# Patient Record
Sex: Female | Born: 1940 | Race: Black or African American | Hispanic: No | Smoking: Former smoker
Health system: Southern US, Community
[De-identification: ages and names within clinical notes are randomized; demographics above are authoritative.]

## PROBLEM LIST (undated history)

## (undated) DIAGNOSIS — E039 Hypothyroidism, unspecified: Secondary | ICD-10-CM

## (undated) DIAGNOSIS — R51 Headache: Secondary | ICD-10-CM

## (undated) DIAGNOSIS — E785 Hyperlipidemia, unspecified: Secondary | ICD-10-CM

## (undated) DIAGNOSIS — R519 Headache, unspecified: Secondary | ICD-10-CM

## (undated) DIAGNOSIS — I10 Essential (primary) hypertension: Secondary | ICD-10-CM

## (undated) DIAGNOSIS — G43909 Migraine, unspecified, not intractable, without status migrainosus: Secondary | ICD-10-CM

## (undated) DIAGNOSIS — G629 Polyneuropathy, unspecified: Secondary | ICD-10-CM

## (undated) DIAGNOSIS — M1990 Unspecified osteoarthritis, unspecified site: Secondary | ICD-10-CM

## (undated) DIAGNOSIS — Z9289 Personal history of other medical treatment: Secondary | ICD-10-CM

## (undated) DIAGNOSIS — I639 Cerebral infarction, unspecified: Secondary | ICD-10-CM

## (undated) DIAGNOSIS — F0390 Unspecified dementia without behavioral disturbance: Secondary | ICD-10-CM

## (undated) HISTORY — PX: CATARACT EXTRACTION, BILATERAL: SHX1313

## (undated) HISTORY — PX: LAPAROSCOPIC CHOLECYSTECTOMY: SUR755

## (undated) HISTORY — PX: CARPAL TUNNEL RELEASE: SHX101

---

## 1997-09-08 ENCOUNTER — Other Ambulatory Visit: Admission: RE | Admit: 1997-09-08 | Discharge: 1997-09-08 | Payer: Self-pay | Admitting: Obstetrics & Gynecology

## 1998-02-04 ENCOUNTER — Encounter: Payer: Self-pay | Admitting: Emergency Medicine

## 1998-02-04 ENCOUNTER — Emergency Department (HOSPITAL_COMMUNITY): Admission: EM | Admit: 1998-02-04 | Discharge: 1998-02-04 | Payer: Self-pay | Admitting: Emergency Medicine

## 1998-02-06 ENCOUNTER — Other Ambulatory Visit: Admission: RE | Admit: 1998-02-06 | Discharge: 1998-02-06 | Payer: Self-pay | Admitting: Obstetrics and Gynecology

## 1998-06-22 ENCOUNTER — Encounter: Payer: Self-pay | Admitting: Family Medicine

## 1998-06-22 ENCOUNTER — Ambulatory Visit (HOSPITAL_COMMUNITY): Admission: RE | Admit: 1998-06-22 | Discharge: 1998-06-22 | Payer: Self-pay | Admitting: Family Medicine

## 2002-06-03 ENCOUNTER — Ambulatory Visit: Admission: RE | Admit: 2002-06-03 | Discharge: 2002-06-03 | Payer: Self-pay | Admitting: Obstetrics and Gynecology

## 2004-01-19 ENCOUNTER — Other Ambulatory Visit: Admission: RE | Admit: 2004-01-19 | Discharge: 2004-01-19 | Payer: Self-pay | Admitting: Obstetrics and Gynecology

## 2004-08-06 ENCOUNTER — Encounter: Admission: RE | Admit: 2004-08-06 | Discharge: 2004-08-06 | Payer: Self-pay | Admitting: Obstetrics and Gynecology

## 2005-01-21 ENCOUNTER — Other Ambulatory Visit: Admission: RE | Admit: 2005-01-21 | Discharge: 2005-01-21 | Payer: Self-pay | Admitting: Obstetrics and Gynecology

## 2005-05-10 ENCOUNTER — Ambulatory Visit: Payer: Self-pay | Admitting: Gastroenterology

## 2005-05-27 ENCOUNTER — Encounter (INDEPENDENT_AMBULATORY_CARE_PROVIDER_SITE_OTHER): Payer: Self-pay | Admitting: *Deleted

## 2005-05-27 ENCOUNTER — Ambulatory Visit: Payer: Self-pay | Admitting: Gastroenterology

## 2005-12-20 ENCOUNTER — Ambulatory Visit (HOSPITAL_BASED_OUTPATIENT_CLINIC_OR_DEPARTMENT_OTHER): Admission: RE | Admit: 2005-12-20 | Discharge: 2005-12-20 | Payer: Self-pay | Admitting: Orthopedic Surgery

## 2006-03-11 ENCOUNTER — Other Ambulatory Visit: Admission: RE | Admit: 2006-03-11 | Discharge: 2006-03-11 | Payer: Self-pay | Admitting: Obstetrics and Gynecology

## 2006-11-27 ENCOUNTER — Ambulatory Visit (HOSPITAL_COMMUNITY): Admission: RE | Admit: 2006-11-27 | Discharge: 2006-11-27 | Payer: Self-pay | Admitting: Internal Medicine

## 2008-04-26 ENCOUNTER — Ambulatory Visit (HOSPITAL_BASED_OUTPATIENT_CLINIC_OR_DEPARTMENT_OTHER): Admission: RE | Admit: 2008-04-26 | Discharge: 2008-04-26 | Payer: Self-pay | Admitting: Orthopedic Surgery

## 2009-01-20 ENCOUNTER — Ambulatory Visit (HOSPITAL_COMMUNITY): Admission: RE | Admit: 2009-01-20 | Discharge: 2009-01-20 | Payer: Self-pay | Admitting: Internal Medicine

## 2009-08-16 ENCOUNTER — Telehealth: Payer: Self-pay | Admitting: Gastroenterology

## 2009-12-20 ENCOUNTER — Encounter (INDEPENDENT_AMBULATORY_CARE_PROVIDER_SITE_OTHER): Payer: Self-pay | Admitting: *Deleted

## 2010-02-23 ENCOUNTER — Encounter (INDEPENDENT_AMBULATORY_CARE_PROVIDER_SITE_OTHER): Payer: Self-pay | Admitting: *Deleted

## 2010-02-28 ENCOUNTER — Encounter: Admission: RE | Admit: 2010-02-28 | Discharge: 2010-02-28 | Payer: Self-pay | Admitting: Internal Medicine

## 2010-03-02 ENCOUNTER — Ambulatory Visit: Payer: Self-pay | Admitting: Gastroenterology

## 2010-03-07 ENCOUNTER — Ambulatory Visit: Payer: Self-pay | Admitting: Gastroenterology

## 2010-05-15 NOTE — Letter (Signed)
Summary: Pre Visit Letter Revised  [facility]  [address] [place], [place] [address]   Phone: [phone]  Fax: [phone]        12/20/2009 MRN: [id number]   Kristen Parks [address] [place], [place]  [address]              Welcome to the Gastroenterology Division at [facility].    You are scheduled to see a nurse for your pre-procedure visit on January 29, 2010 at 10:00am on the 3rd floor at [facility], [address]  We ask that you try to arrive at our office 15 minutes prior to your appointment time to allow for check-in.  Please take a minute to review the attached form.  If you answer "Yes" to one or more of the questions on the first page, we ask that you call the person listed at your earliest opportunity.  If you answer "No" to all of the questions, please complete the rest of the form and bring it to your appointment.    Your nurse visit will consist of discussing your medical and surgical history, your immediate family medical history, and your medications.   If you are unable to list all of your medications on the form, please bring the medication bottles to your appointment and we will list them.  We will need to be aware of both prescribed and over the counter drugs.  We will need to know exact dosage information as well.    Please be prepared to read and sign documents such as consent forms, a financial agreement, and acknowledgement forms.  If necessary, and with your consent, a friend or relative is welcome to sit-in on the nurse visit with you.  Please bring your insurance card so that we may make a copy of it.  If your insurance requires a referral to see a specialist, please bring your referral form from your primary care physician.  No co-pay is required for this nurse visit.     If you cannot keep your appointment, please call [phone] to cancel or reschedule prior to your appointment date.  This allows [place] the  opportunity to schedule an appointment for another patient in need of care.    Thank you for choosing [facility] for your medical needs.  We appreciate the opportunity to care for you.  Please visit [place] at our website  to learn more about our practice.  Sincerely, The Gastroenterology Division

## 2010-05-15 NOTE — Procedures (Signed)
Summary: Colonoscopy  Patient: Kristen Parks Note: All result statuses are Final unless otherwise noted.  Tests: (1) Colonoscopy (COL)   COL Colonoscopy           DONE     [facility]     [address]     [place], [place]  [address]           COLONOSCOPY PROCEDURE REPORT           PATIENT:  Kristen, Parks  MR#:  [id number]     BIRTHDATE:  [birth year], 69 yrs. old  GENDER:  female     ENDOSCOPIST:  Vania Rea. Jarold Motto, MD, [facility]     REF. BY:     PROCEDURE DATE:  03/07/2010     PROCEDURE:  Higher-risk screening colonoscopy G0105           ASA CLASS:  Class II     INDICATIONS:  history of pre-cancerous (adenomatous) colon polyps           MEDICATIONS:   Fentanyl 75 mcg IV, Versed 7 mg IV           DESCRIPTION OF PROCEDURE:   After the risks benefits and     alternatives of the procedure were thoroughly explained, informed     consent was obtained.  Digital rectal exam was performed and     revealed no abnormalities.   The LB CF-[id number] endoscope     was introduced through the anus and advanced to the cecum, which     was identified by both the appendix and ileocecal valve, limited     by a redundant colon.    The quality of the prep was adequate,     using MoviPrep.  The instrument was then slowly withdrawn as the     colon was fully examined.     <<PROCEDUREIMAGES>>           FINDINGS:  Mild diverticulosis was found in the sigmoid to     descending colon segments.  No polyps or cancers were seen.  This     was otherwise a normal examination of the colon.   Retroflexed     views in the rectum revealed no abnormalities.    The scope was     then withdrawn from the patient and the procedure completed.           COMPLICATIONS:  None     ENDOSCOPIC IMPRESSION:     1) Mild diverticulosis in the sigmoid to descending colon     segments     2) No polyps or cancers     3) Otherwise normal examination     RECOMMENDATIONS:     1) Repeat Colonoscopy in 5 years.     2)  high fiber diet     REPEAT EXAM:  No           ______________________________     Vania Rea. Jarold Motto, MD, [facility]           CC:  Jarome Matin, MD           n.     Rosalie Doctor:   Vania Rea. Patterson at 03/07/2010 11:53 AM           [place] Haw, [phone]  Note: An exclamation mark (!) indicates a result that was not dispersed into the flowsheet. Document Creation Date: 03/07/2010 11:54 AM _______________________________________________________________________  (1) Order result status: Final Collection or observation date-time: 03/07/2010 11:47 Requested date-time:  Receipt  date-time:  Reported date-time:  Referring Physician:   Ordering Physician: Sheryn Bison [phone]) Specimen Source:  Source: Launa Grill Order Number: [id number] Lab site:   Appended Document: Colonoscopy    Clinical Lists Changes  Observations: Added new observation of COLONNXTDUE: 02/2015 (03/07/2010 12:09)

## 2010-05-15 NOTE — Miscellaneous (Signed)
Summary: previsit prep/rm  Clinical Lists Changes  Medications: Added new medication of MOVIPREP 100 GM  SOLR (PEG-KCL-NACL-NASULF-NA ASC-C) As per prep instructions. - Signed Rx of MOVIPREP 100 GM  SOLR (PEG-KCL-NACL-NASULF-NA ASC-C) As per prep instructions.;  #1 x 0;  Signed;  Entered by: Sherren Kerns RN;  Authorized by: Mardella Layman MD [facility];  Method used: Electronically to [facility] [phone]*, [address], [place], [place]  [address], Ph: [phone], Fax: [phone] Allergies: Added new allergy or adverse reaction of PENICILLIN Observations: Added new observation of ALLERGY REV: Done (03/02/2010 13:15) Added new observation of NKA: F (03/02/2010 13:15)    Prescriptions: MOVIPREP 100 GM  SOLR (PEG-KCL-NACL-NASULF-NA ASC-C) As per prep instructions.  #1 x 0   Entered by:   Sherren Kerns RN   Authorized by:   Mardella Layman MD [facility]   Signed by:   Sherren Kerns RN on 03/02/2010   Method used:   Electronically to        [facility] [phone]* (retail)       [address]       [place], [place]  [address]       Ph: [phone]       Fax: [phone]   RxID:   [id number]

## 2010-05-15 NOTE — Letter (Signed)
Summary: [facility] Instructions  [facility]  [address] [place], [place] [address]   Phone: [phone]  Fax: [phone]       Kristen Parks    January 11, 1941    MRN: [id number]        Procedure Day Dorna Bloom: Wednesday 03-07-10     Arrival Time: 10:00 am     Procedure Time: 11:00 am     Location of Procedure:                       [facility] (4th Floor)   PREPARATION FOR COLONOSCOPY WITH MOVIPREP   Starting 5 days prior to your procedure  03-02-10 do not eat nuts, seeds, popcorn, corn, beans, peas,  salads, or any raw vegetables.  Do not take any fiber supplements (e.g. Metamucil, Citrucel, and Benefiber).  THE DAY BEFORE YOUR PROCEDURE         DATE:  03-06-10  DAY: Tuesday   1.  Drink clear liquids the entire day-NO SOLID FOOD  2.  Do not drink anything colored red or purple.  Avoid juices with pulp.  No orange juice.  3.  Drink at least 64 oz. (8 glasses) of fluid/clear liquids during the day to prevent dehydration and help the prep work efficiently.  CLEAR LIQUIDS INCLUDE: Water Jello Ice Popsicles Tea (sugar ok, no milk/cream) Powdered fruit flavored drinks Coffee (sugar ok, no milk/cream) Gatorade Juice: apple, white grape, white cranberry  Lemonade Clear bullion, consomm, broth Carbonated beverages (any kind) Strained chicken noodle soup Hard Candy                             4.  In the morning, mix first dose of MoviPrep solution:    Empty 1 Pouch A and 1 Pouch B into the disposable container    Add lukewarm drinking water to the top line of the container. Mix to dissolve    Refrigerate (mixed solution should be used within 24 hrs)  5.  Begin drinking the prep at 5:00 p.m. The MoviPrep container is divided by 4 marks.   Every 15 minutes drink the solution down to the next mark (approximately 8 oz) until the full liter is complete.   6.  Follow completed prep with 16 oz of clear liquid of your choice (Nothing red or purple).   Continue to drink clear liquids until bedtime.  7.  Before going to bed, mix second dose of MoviPrep solution:    Empty 1 Pouch A and 1 Pouch B into the disposable container    Add lukewarm drinking water to the top line of the container. Mix to dissolve    Refrigerate  THE DAY OF YOUR PROCEDURE      DATE:  03-07-10 DAY:  Wednesday  Beginning at  6:00 a.m. (5 hours before procedure):         1. Every 15 minutes, drink the solution down to the next mark (approx 8 oz) until the full liter is complete.  2. Follow completed prep with 16 oz. of clear liquid of your choice.    3. You may drink clear liquids until  9:00 a.m. (2 HOURS BEFORE PROCEDURE).   MEDICATION INSTRUCTIONS  Unless otherwise instructed, you should take regular prescription medications with a small sip of water   as early as possible the morning of your procedure.         OTHER INSTRUCTIONS  You  will need a responsible adult at least 72 years of age to accompany you and drive you home.   This person must remain in the waiting room during your procedure.  Wear loose fitting clothing that is easily removed.  Leave jewelry and other valuables at home.  However, you may wish to bring a book to read or  an iPod/MP3 player to listen to music as you wait for your procedure to start.  Remove all body piercing jewelry and leave at home.  Total time from sign-in until discharge is approximately 2-3 hours.  You should go home directly after your procedure and rest.  You can resume normal activities the  day after your procedure.  The day of your procedure you should not:   Drive   Make legal decisions   Operate machinery   Drink alcohol   Return to work  You will receive specific instructions about eating, activities and medications before you leave.    The above instructions have been reviewed and explained to me by   Sherren Kerns RN  March 02, 2010 2:05 PM    I fully understand and can  verbalize these instructions _____________________________ Date _________

## 2010-05-15 NOTE — Progress Notes (Signed)
Summary: Schedule Colonoscopy  Phone Note Outgoing Call Call back at Home Phone [phone]   Call placed by: Harlow Mares CMA [facility]),  Aug 16, 2009 4:40 PM Call placed to: Patient Summary of Call: patient stated that she never had a colonoscopy then she said that she might have done one but she is not ready to schedule and she will call back when she is ready. I gave the patient the number and advised her the importance of calling back to schedule.  Initial call taken by: Harlow Mares CMA [facility]),  Aug 16, 2009 4:45 PM

## 2010-07-30 LAB — BASIC METABOLIC PANEL
CO2: 26 mEq/L (ref 19–32)
Calcium: 9.3 mg/dL (ref 8.4–10.5)
Creatinine, Ser: 0.66 mg/dL (ref 0.4–1.2)
GFR calc Af Amer: >60 mL/min (ref >60)

## 2010-08-28 NOTE — Op Note (Signed)
Kristen Parks, Kristen Parks                ACCOUNT NO.:  [id number]   MEDICAL RECORD NO.:  [id number]          PATIENT TYPE:  AMB   LOCATION:  DSC                          FACILITY:  [facility]   PHYSICIAN:  Katy Fitch. Sypher, M.D. DATE OF BIRTH:  1941/03/24   DATE OF PROCEDURE:  04/26/2008  DATE OF DISCHARGE:                               OPERATIVE REPORT   PREOPERATIVE DIAGNOSES:  1. Chronic entrapped neuropathy, left median nerve at carpal tunnel.  2. 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  3. 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  4. 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   POSTOPERATIVE DIAGNOSES:  1. Chronic entrapped neuropathy, left median nerve at carpal tunnel.  2. 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  3. 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  4. 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   OPERATION:  1. Release of left transverse carpal ligament.  2. Release of left thumb A1 pulley.  3. Release of left index finger A1 pulley with tenosynovectomy.  4. Release of left long finger A1 pulley with tenosynovectomy.   OPERATING SURGEON:  Katy Fitch. Sypher, MD   ASSISTANT:  Marveen Reeks Dasnoit, PA-C   ANESTHESIA:  General by LMA.   SUPERVISING ANESTHESIOLOGIST:  Burna Forts, MD   INDICATIONS:  Kristen Parks is a 70 year old woman referred for  evaluation and management of significant pain in her left hand.  Clinical examination revealed evidence of entrapped neuropathy of the  median nerve at the level of the carpal tunnel and multiple trigger  fingers.   Clinical examination in the office suggested the involvement of the  thumb and long finger.  [facility] revealed triggering of the index finger as well.   Due to a failed response to the nonoperative measures, she was brought  to the  operating room at this time for release of her left transcarpal  ligament and release of A1 pulleys of the thumb, index, and long  fingers.   Preoperatively, questions were invited and answered in detail.  She was  interviewed by Dr. Jacklynn Bue of Anesthesia and decided to proceed with  general anesthesia by LMA technique.   PROCEDURE:  [name] was brought to the operating room and placed in  supine position on the operating table.  A preoperative time-out was  completed and her allergy to penicillin noted.  Under Dr. Marlane Mingle  supervision, general anesthesia by LMA technique was induced followed by  routine Betadine scrub and paint of left upper extremity.   Following exsanguination of left arm with Esmarch bandage, arterial  tourniquet was inflated to 230 mmHg.  Procedure commenced with a short  incision in the line of the ring finger and the palm.  Subcutaneous  tissues were carefully divided revealing the palmar fascia.  This was  split longitudinally to reveal a common sensory branch of the median  nerve.  These were followed back to transcarpal ligaments, which was  gently isolated from median  nerve.  The ligament was then released along  its ulnar border extending into the distal forearm.   This widely opened carpal canal.  No masses were encountered.  Bleeding  points were not problematic, therefore the wound was then repaired with  intradermal through Prolene.   Attention then directed to the thumb and fingers.  The hand was placed  in a lead hand serial incisions were made of the A1 pulleys of the thumb  index and long fingers.  Subcutaneous were carefully divided taking care  to gently retract neurovascular structures.  The A1 pulleys were  isolated split with scalpel and scissors and the tendons delivered.  The  flexor pollicis longus simply had a constriction at the site of its  compression.  The index and long finger flexor tendons had abundant  synovium that had  built up around the superficialis tendon at the  decussation.  This was removed by delivery of the tendons with a Ragnell  retractor and use of a micro rongeur to strip the tendons.   Thereafter, free range of motion of the thumb index and long finger was  recovered with flexion of the fingertip to the palm.   There were no apparent complications.   Each wound was repaired with intradermal through Prolene and for the  trigger finger wounds, mattress sutures of 5-0 nylon.  There were no  apparent complications.   Ms. Nuccio was placed in compressive dressing of Xeroflo sterile gauze  and a volar plaster splint maintaining the wrist in 5 degrees of  dorsiflexion.   For aftercare, she has provided a prescription for Percocet 5 mg one  p.o. q.4-6 h. p.r.n. pain, 20 tablets without refill.   She will return to our office for interval followup in 1 week for suture  removal and initiation of the therapy program.      Katy Fitch. Sypher, M.D.  Electronically Signed     RVS/MEDQ  D:  04/26/2008  T:  04/27/2008  Job:  [id number]   cc:   Barry Dienes. Eloise Harman, M.D.

## 2011-01-22 ENCOUNTER — Other Ambulatory Visit: Payer: Self-pay | Admitting: Internal Medicine

## 2011-01-22 DIAGNOSIS — Z1231 Encounter for screening mammogram for malignant neoplasm of breast: Secondary | ICD-10-CM

## 2011-02-25 ENCOUNTER — Other Ambulatory Visit: Payer: Self-pay | Admitting: Gastroenterology

## 2011-03-04 ENCOUNTER — Ambulatory Visit: Payer: Self-pay

## 2011-11-29 ENCOUNTER — Ambulatory Visit: Payer: Self-pay

## 2012-01-10 ENCOUNTER — Ambulatory Visit: Payer: Self-pay

## 2012-01-16 ENCOUNTER — Ambulatory Visit: Payer: Self-pay | Admitting: Obstetrics and Gynecology

## 2012-01-23 ENCOUNTER — Ambulatory Visit
Admission: RE | Admit: 2012-01-23 | Discharge: 2012-01-23 | Disposition: A | Discharge status: Home / Self Care | Payer: PRIVATE HEALTH INSURANCE | Source: Ambulatory Visit | Attending: Internal Medicine | Admitting: Internal Medicine

## 2012-01-23 DIAGNOSIS — Z1231 Encounter for screening mammogram for malignant neoplasm of breast: Secondary | ICD-10-CM

## 2012-01-28 ENCOUNTER — Ambulatory Visit (INDEPENDENT_AMBULATORY_CARE_PROVIDER_SITE_OTHER): Payer: PRIVATE HEALTH INSURANCE | Admitting: Obstetrics and Gynecology

## 2012-01-28 ENCOUNTER — Encounter: Payer: Self-pay | Admitting: Obstetrics and Gynecology

## 2012-01-28 VITALS — BP 112/66 | Ht 61.0 in | Wt 136.0 lb

## 2012-01-28 DIAGNOSIS — Z01419 Encounter for gynecological examination (general) (routine) without abnormal findings: Secondary | ICD-10-CM

## 2012-01-28 DIAGNOSIS — Z124 Encounter for screening for malignant neoplasm of cervix: Secondary | ICD-10-CM

## 2012-01-28 NOTE — Progress Notes (Signed)
   Subjective:    Kristen Parks is a 71 y.o. female G6P5 who presents for annual exam. The patient complains of arthritis. She has a history of pelvic vaccination and prolapse.  The following portions of the patient's history were reviewed and updated as appropriate: allergies, current medications, past family history, past medical history, past social history, past surgical history and problem list.  Review of Systems Pertinent items are noted in HPI. Gastrointestinal:No change in bowel habits, no abdominal pain, no rectal bleeding Genitourinary:negative for dysuria, frequency, hematuria, nocturia and urinary incontinence    Objective:     BP 112/66  Ht 5\' 1"  (1.549 m)  Wt 136 lb (61.689 kg)  BMI 25.70 kg/m2  Weight:  Wt Readings from Last 1 Encounters:  01/28/12 136 lb (61.689 kg)     BMI: Body mass index is 25.70 kg/(m^2). General Appearance: Alert, appropriate appearance for age. No acute distress HEENT: Grossly normal Neck / Thyroid: Supple, no masses, nodes or enlargement Lungs: clear to auscultation bilaterally Back: No CVA tenderness Breast Exam: No masses or nodes.No dimpling, nipple retraction or discharge. Cardiovascular: Regular rate and rhythm. S1, S2, no murmur Gastrointestinal: Soft, non-tender, no masses or organomegaly  ++++++++++++++++++++++++++++++++++++++++++++++++++++++++  Pelvic Exam: External genitalia: normal general appearance Vaginal: normal without tenderness, induration or masses and marked relaxation noted Cervix: normal appearance and this ends half the length of the vagina Adnexa: normal bimanual exam Uterus: small, nontender Rectovaginal: normal rectal, no masses  ++++++++++++++++++++++++++++++++++++++++++++++++++++++++  Lymphatic Exam: Non-palpable nodes in neck, clavicular, axillary, or inguinal regions  Psychiatric: Alert and oriented, appropriate affect.@OBJECTIVEEN @      Assessment:    Menopause pelvic relaxation with uterine  prolapse   Overweight or obese: Yes  Pelvic relaxation: Yes  Menopausal symptoms: Yes. Severe: No.   Plan:    Pap smear.   Follow-up:  for annual exam  The updated Pap smear screening guidelines were discussed with the patient. The patient requested that I obtain a Pap smear: Yes.  Kegel exercises discussed: Yes.  Proper diet and regular exercise were reviewed.  Annual mammograms recommended starting at age 63. Proper breast care was discussed.  Screening colonoscopy is recommended beginning at age 44.  Regular health maintenance was reviewed.  Sleep hygiene was discussed.  Adequate calcium and vitamin D intake was emphasized.  Leonard Schwartz M.D.  Regular Periods: no Mammogram: yes  Monthly Breast Ex.: yes Exercise: yes  Tetanus < 10 years: unsure Seatbelts: yes  NI. Bladder Functn.: yes Abuse at home: no  Daily BM's: yes Stressful Work: no  Healthy Diet: yes Sigmoid-Colonoscopy: 2010  Calcium: no Medical problems this year: none   LAST PAP:02/22/2010  Contraception: Post-Menopause  Mammogram:  01/2012  PCP:  Dr. Eloise Harman  PMH: None  FMH: None  Last Bone Scan: unsure of date. Was with PCP.

## 2012-01-29 LAB — PAP IG W/ RFLX HPV ASCU

## 2012-02-10 ENCOUNTER — Other Ambulatory Visit: Payer: Self-pay | Admitting: Internal Medicine

## 2012-02-10 DIAGNOSIS — Z1231 Encounter for screening mammogram for malignant neoplasm of breast: Secondary | ICD-10-CM

## 2013-01-25 ENCOUNTER — Ambulatory Visit: Payer: PRIVATE HEALTH INSURANCE

## 2013-08-12 ENCOUNTER — Other Ambulatory Visit: Payer: Self-pay | Admitting: Obstetrics and Gynecology

## 2013-08-12 DIAGNOSIS — Z1231 Encounter for screening mammogram for malignant neoplasm of breast: Secondary | ICD-10-CM

## 2013-09-02 ENCOUNTER — Ambulatory Visit: Payer: PRIVATE HEALTH INSURANCE

## 2013-09-22 ENCOUNTER — Ambulatory Visit: Payer: PRIVATE HEALTH INSURANCE

## 2013-12-14 ENCOUNTER — Ambulatory Visit
Admission: RE | Admit: 2013-12-14 | Discharge: 2013-12-14 | Disposition: A | Discharge status: Home / Self Care | Payer: PRIVATE HEALTH INSURANCE | Source: Ambulatory Visit | Attending: Obstetrics and Gynecology | Admitting: Obstetrics and Gynecology

## 2013-12-14 DIAGNOSIS — Z1231 Encounter for screening mammogram for malignant neoplasm of breast: Secondary | ICD-10-CM

## 2014-02-14 ENCOUNTER — Encounter: Payer: Self-pay | Admitting: Obstetrics and Gynecology

## 2014-11-16 ENCOUNTER — Encounter: Payer: Self-pay | Admitting: Gastroenterology

## 2014-11-29 ENCOUNTER — Other Ambulatory Visit: Payer: Self-pay

## 2014-11-29 DIAGNOSIS — Z1231 Encounter for screening mammogram for malignant neoplasm of breast: Secondary | ICD-10-CM

## 2014-12-28 ENCOUNTER — Ambulatory Visit: Payer: Self-pay

## 2015-01-17 ENCOUNTER — Ambulatory Visit: Payer: Self-pay

## 2015-02-17 ENCOUNTER — Ambulatory Visit: Payer: Self-pay

## 2015-02-21 ENCOUNTER — Encounter: Payer: Self-pay | Admitting: Internal Medicine

## 2015-11-30 ENCOUNTER — Other Ambulatory Visit: Payer: Self-pay

## 2015-11-30 ENCOUNTER — Other Ambulatory Visit: Payer: Self-pay | Admitting: Obstetrics and Gynecology

## 2015-11-30 DIAGNOSIS — Z1231 Encounter for screening mammogram for malignant neoplasm of breast: Secondary | ICD-10-CM

## 2015-12-14 ENCOUNTER — Ambulatory Visit: Payer: Self-pay

## 2016-11-22 ENCOUNTER — Ambulatory Visit (INDEPENDENT_AMBULATORY_CARE_PROVIDER_SITE_OTHER): Payer: Medicare Other | Admitting: Podiatry

## 2016-11-22 ENCOUNTER — Ambulatory Visit (INDEPENDENT_AMBULATORY_CARE_PROVIDER_SITE_OTHER): Payer: Medicare Other

## 2016-11-22 ENCOUNTER — Encounter: Payer: Self-pay | Admitting: Podiatry

## 2016-11-22 ENCOUNTER — Other Ambulatory Visit: Payer: Self-pay | Admitting: Podiatry

## 2016-11-22 VITALS — BP 120/75 | HR 78 | Resp 16 | Ht 62.0 in | Wt 150.0 lb

## 2016-11-22 DIAGNOSIS — M779 Enthesopathy, unspecified: Secondary | ICD-10-CM

## 2016-11-22 DIAGNOSIS — L600 Ingrowing nail: Secondary | ICD-10-CM | POA: Diagnosis not present

## 2016-11-22 DIAGNOSIS — M79674 Pain in right toe(s): Secondary | ICD-10-CM

## 2016-11-22 NOTE — Progress Notes (Signed)
   Subjective:    Patient ID: Kristen Parks, female    DOB: 08/25/1940, 76 y.o.   MRN: [id number]  HPI Chief Complaint  Patient presents with  . Toe Pain    Right foot; great toe-bottom of toe; pt stated, "Has had for the past 1-2 months"      Review of Systems  Cardiovascular: Positive for leg swelling.  Gastrointestinal: Positive for constipation.  Endocrine: Positive for heat intolerance.  Genitourinary: Positive for frequency.  Musculoskeletal: Positive for myalgias.  Skin: Positive for rash.  Neurological: Positive for headaches.  All other systems reviewed and are negative.      Objective:   Physical Exam        Assessment & Plan:

## 2016-11-22 NOTE — Progress Notes (Signed)
Subjective:    Patient ID: Kristen Parks, female   DOB: 76 y.o.   MRN: [id number]   HPI patient presents concerned about discoloration of the nailbed right over left hallux and states she has pain in both her feet both in the ankles and the forefoot and around the nailbed right over left    Review of Systems  All other systems reviewed and are negative.       Objective:  Physical Exam  Constitutional: She is oriented to person, place, and time.  Cardiovascular: Intact distal pulses.   Musculoskeletal: Normal range of motion.  Neurological: She is alert and oriented to person, place, and time.  Skin: Skin is warm.  Nursing note and vitals reviewed.  neurovascular status intact muscle strength adequate range of motion within normal limits with patient noted to have mild discomfort in the medial lateral both feet and discomfort around the hallux nailbed proximal portion right over left with discoloration of the hallux nail right over left localized of recent duration     Assessment:    Probability for discoloration of nailbeds secondary to trauma with tendinitis and capsulitis-like symptomatology bilateral     Plan:  H&P and x-ray right foot reviewed. At this point I recommended anti-inflammatories and supportive shoe and applied padding to the hallux bilateral take pressure off the toenails. If toenails were to get loose or other pathology were to occur let us know but at this point it should be self-limiting  X-rays indicated that there is arthritis of a moderate nature with no indications of stress fracture

## 2018-04-14 ENCOUNTER — Emergency Department (HOSPITAL_COMMUNITY): Payer: Medicare Other

## 2018-04-14 ENCOUNTER — Observation Stay (HOSPITAL_COMMUNITY): Payer: Medicare Other

## 2018-04-14 ENCOUNTER — Inpatient Hospital Stay (HOSPITAL_COMMUNITY)
Admission: EM | Admit: 2018-04-14 | Discharge: 2018-04-16 | DRG: 066 | Disposition: A | Discharge status: Home / Self Care | Payer: Medicare Other | Attending: Internal Medicine | Admitting: Internal Medicine

## 2018-04-14 ENCOUNTER — Other Ambulatory Visit: Payer: Self-pay

## 2018-04-14 ENCOUNTER — Encounter (HOSPITAL_COMMUNITY): Payer: Self-pay | Admitting: Emergency Medicine

## 2018-04-14 DIAGNOSIS — E785 Hyperlipidemia, unspecified: Secondary | ICD-10-CM | POA: Diagnosis present

## 2018-04-14 DIAGNOSIS — Z66 Do not resuscitate: Secondary | ICD-10-CM | POA: Diagnosis present

## 2018-04-14 DIAGNOSIS — Z79899 Other long term (current) drug therapy: Secondary | ICD-10-CM

## 2018-04-14 DIAGNOSIS — Z9289 Personal history of other medical treatment: Secondary | ICD-10-CM

## 2018-04-14 DIAGNOSIS — I639 Cerebral infarction, unspecified: Secondary | ICD-10-CM

## 2018-04-14 DIAGNOSIS — I6310 Cerebral infarction due to embolism of unspecified precerebral artery: Secondary | ICD-10-CM

## 2018-04-14 DIAGNOSIS — F0391 Unspecified dementia with behavioral disturbance: Secondary | ICD-10-CM | POA: Diagnosis not present

## 2018-04-14 DIAGNOSIS — D509 Iron deficiency anemia, unspecified: Secondary | ICD-10-CM | POA: Diagnosis present

## 2018-04-14 DIAGNOSIS — R531 Weakness: Secondary | ICD-10-CM

## 2018-04-14 DIAGNOSIS — I10 Essential (primary) hypertension: Secondary | ICD-10-CM | POA: Diagnosis present

## 2018-04-14 DIAGNOSIS — G629 Polyneuropathy, unspecified: Secondary | ICD-10-CM | POA: Diagnosis present

## 2018-04-14 DIAGNOSIS — F0390 Unspecified dementia without behavioral disturbance: Secondary | ICD-10-CM | POA: Diagnosis present

## 2018-04-14 DIAGNOSIS — D649 Anemia, unspecified: Secondary | ICD-10-CM | POA: Diagnosis present

## 2018-04-14 DIAGNOSIS — E039 Hypothyroidism, unspecified: Secondary | ICD-10-CM | POA: Diagnosis present

## 2018-04-14 DIAGNOSIS — Z87891 Personal history of nicotine dependence: Secondary | ICD-10-CM

## 2018-04-14 DIAGNOSIS — I63422 Cerebral infarction due to embolism of left anterior cerebral artery: Secondary | ICD-10-CM | POA: Diagnosis not present

## 2018-04-14 DIAGNOSIS — H9190 Unspecified hearing loss, unspecified ear: Secondary | ICD-10-CM | POA: Diagnosis present

## 2018-04-14 DIAGNOSIS — R29701 NIHSS score 1: Secondary | ICD-10-CM | POA: Diagnosis present

## 2018-04-14 HISTORY — DX: Essential (primary) hypertension: I10

## 2018-04-14 HISTORY — DX: Unspecified dementia, unspecified severity, without behavioral disturbance, psychotic disturbance, mood disturbance, and anxiety: F03.90

## 2018-04-14 HISTORY — DX: Headache: R51

## 2018-04-14 HISTORY — DX: Personal history of other medical treatment: Z92.89

## 2018-04-14 HISTORY — DX: Unspecified osteoarthritis, unspecified site: M19.90

## 2018-04-14 HISTORY — DX: Headache, unspecified: R51.9

## 2018-04-14 HISTORY — DX: Cerebral infarction, unspecified: I63.9

## 2018-04-14 HISTORY — DX: Migraine, unspecified, not intractable, without status migrainosus: G43.909

## 2018-04-14 HISTORY — DX: Polyneuropathy, unspecified: G62.9

## 2018-04-14 HISTORY — DX: Hyperlipidemia, unspecified: E78.5

## 2018-04-14 HISTORY — DX: Hypothyroidism, unspecified: E03.9

## 2018-04-14 LAB — PREPARE RBC (CROSSMATCH)

## 2018-04-14 LAB — ABO/RH: ABO/RH(D): A POS

## 2018-04-14 LAB — I-STAT CHEM 8, ED
BUN: 15 mg/dL (ref 8–23)
Calcium, Ion: 1.27 mmol/L (ref 1.15–1.40)
Chloride: 110 mmol/L (ref 98–111)
Creatinine, Ser: 0.7 mg/dL (ref 0.44–1.00)
Glucose, Bld: 96 mg/dL (ref 70–99)
HCT: 22 % — ABNORMAL LOW (ref 36.0–46.0)
Hemoglobin: 7.5 g/dL — ABNORMAL LOW (ref 12.0–15.0)
POTASSIUM: 3.7 mmol/L (ref 3.5–5.1)
Sodium: 142 mmol/L (ref 135–145)
TCO2: 23 mmol/L (ref 22–32)

## 2018-04-14 LAB — I-STAT TROPONIN, ED: Troponin i, poc: 0.01 ng/mL (ref 0.00–0.08)

## 2018-04-14 LAB — COMPREHENSIVE METABOLIC PANEL
ALT: 11 U/L (ref 0–44)
AST: 18 U/L (ref 15–41)
Albumin: 3.6 g/dL (ref 3.5–5.0)
Alkaline Phosphatase: 90 U/L (ref 38–126)
Anion gap: 9 (ref 5–15)
BUN: 14 mg/dL (ref 8–23)
CHLORIDE: 110 mmol/L (ref 98–111)
CO2: 21 mmol/L — ABNORMAL LOW (ref 22–32)
CREATININE: 0.75 mg/dL (ref 0.44–1.00)
Calcium: 9.1 mg/dL (ref 8.9–10.3)
GFR calc Af Amer: >60 mL/min (ref >60)
GFR calc non Af Amer: >60 mL/min (ref >60)
Glucose, Bld: 99 mg/dL (ref 70–99)
Potassium: 3.7 mmol/L (ref 3.5–5.1)
Sodium: 140 mmol/L (ref 135–145)
Total Bilirubin: 0.4 mg/dL (ref 0.3–1.2)
Total Protein: 6.9 g/dL (ref 6.5–8.1)

## 2018-04-14 LAB — CBC WITH DIFFERENTIAL/PLATELET
Abs Immature Granulocytes: 0.02 10*3/uL (ref 0.00–0.07)
Basophils Absolute: 0.1 10*3/uL (ref 0.0–0.1)
Basophils Relative: 1 %
Eosinophils Absolute: 0.1 10*3/uL (ref 0.0–0.5)
Eosinophils Relative: 3 %
HCT: 22.9 % — ABNORMAL LOW (ref 36.0–46.0)
Hemoglobin: 5.5 g/dL — CL (ref 12.0–15.0)
Immature Granulocytes: 0 %
Lymphocytes Relative: 21 %
Lymphs Abs: 1 10*3/uL (ref 0.7–4.0)
MCH: 15.5 pg — AB (ref 26.0–34.0)
MCHC: 24 g/dL — ABNORMAL LOW (ref 30.0–36.0)
MCV: 64.5 fL — ABNORMAL LOW (ref 80.0–100.0)
Monocytes Absolute: 0.6 10*3/uL (ref 0.1–1.0)
Monocytes Relative: 12 %
Neutro Abs: 2.8 10*3/uL (ref 1.7–7.7)
Neutrophils Relative %: 63 %
Platelets: 433 10*3/uL — ABNORMAL HIGH (ref 150–400)
RBC: 3.55 MIL/uL — ABNORMAL LOW (ref 3.87–5.11)
RDW: 20.5 % — ABNORMAL HIGH (ref 11.5–15.5)
WBC: 4.6 10*3/uL (ref 4.0–10.5)
nRBC: 0 % (ref 0.0–0.2)

## 2018-04-14 LAB — PROTIME-INR
INR: 1.16
Prothrombin Time: 14.7 seconds (ref 11.4–15.2)

## 2018-04-14 LAB — ETHANOL: Alcohol, Ethyl (B): <10 mg/dL (ref <10)

## 2018-04-14 LAB — RETICULOCYTES
Immature Retic Fract: 31.6 % — ABNORMAL HIGH (ref 2.3–15.9)
RBC.: 3.49 MIL/uL — ABNORMAL LOW (ref 3.87–5.11)
RETIC CT PCT: 1.5 % (ref 0.4–3.1)
Retic Count, Absolute: 53 10*3/uL (ref 19.0–186.0)

## 2018-04-14 LAB — FERRITIN: Ferritin: 3 ng/mL — ABNORMAL LOW (ref 11–307)

## 2018-04-14 LAB — APTT: APTT: 31 s (ref 24–36)

## 2018-04-14 LAB — VITAMIN B12: Vitamin B-12: 208 pg/mL (ref 180–914)

## 2018-04-14 LAB — IRON AND TIBC
IRON: 10 ug/dL — AB (ref 28–170)
Saturation Ratios: 2 % — ABNORMAL LOW (ref 10.4–31.8)
TIBC: 420 ug/dL (ref 250–450)
UIBC: 410 ug/dL

## 2018-04-14 LAB — CBC
HCT: 21.8 % — ABNORMAL LOW (ref 36.0–46.0)
Hemoglobin: 5.3 g/dL — CL (ref 12.0–15.0)
MCH: 15.2 pg — ABNORMAL LOW (ref 26.0–34.0)
MCHC: 24.3 g/dL — ABNORMAL LOW (ref 30.0–36.0)
MCV: 62.6 fL — ABNORMAL LOW (ref 80.0–100.0)
Platelets: 403 10*3/uL — ABNORMAL HIGH (ref 150–400)
RBC: 3.48 MIL/uL — ABNORMAL LOW (ref 3.87–5.11)
RDW: 20 % — ABNORMAL HIGH (ref 11.5–15.5)
WBC: 5.4 10*3/uL (ref 4.0–10.5)
nRBC: 0 % (ref 0.0–0.2)

## 2018-04-14 LAB — FOLATE: Folate: 43 ng/mL (ref >5.9)

## 2018-04-14 LAB — SAVE SMEAR(SSMR), FOR PROVIDER SLIDE REVIEW

## 2018-04-14 LAB — POC OCCULT BLOOD, ED: Fecal Occult Bld: NEGATIVE

## 2018-04-14 MED ORDER — PANTOPRAZOLE SODIUM 40 MG PO TBEC
40.0000 mg | DELAYED_RELEASE_TABLET | Freq: Every day | ORAL | Status: DC
  Administered 2018-04-14 – 2018-04-16 (×3): 40 mg via ORAL
  Filled 2018-04-14 (×2): qty 1

## 2018-04-14 MED ORDER — LEVOTHYROXINE SODIUM 112 MCG PO TABS
112.0000 ug | ORAL_TABLET | Freq: Every day | ORAL | Status: DC
  Administered 2018-04-15 – 2018-04-16 (×2): 112 ug via ORAL
  Filled 2018-04-14 (×2): qty 1

## 2018-04-14 MED ORDER — SODIUM CHLORIDE 0.9 % IV SOLN
INTRAVENOUS | Status: DC
  Administered 2018-04-14 – 2018-04-15 (×2): via INTRAVENOUS

## 2018-04-14 MED ORDER — ACETAMINOPHEN 650 MG RE SUPP: 650.0000 mg | RECTAL | Status: DC | PRN

## 2018-04-14 MED ORDER — GABAPENTIN 300 MG PO CAPS
300.0000 mg | ORAL_CAPSULE | Freq: Every day | ORAL | Status: DC
  Administered 2018-04-14 – 2018-04-15 (×2): 300 mg via ORAL
  Filled 2018-04-14 (×2): qty 1

## 2018-04-14 MED ORDER — SENNOSIDES-DOCUSATE SODIUM 8.6-50 MG PO TABS: 1.0000 | ORAL_TABLET | Freq: Every evening | ORAL | Status: DC | PRN

## 2018-04-14 MED ORDER — HYDROXYZINE HCL 25 MG PO TABS: 25.0000 mg | ORAL_TABLET | Freq: Every evening | ORAL | Status: DC | PRN

## 2018-04-14 MED ORDER — ACETAMINOPHEN 160 MG/5ML PO SOLN: 650.0000 mg | ORAL | Status: DC | PRN

## 2018-04-14 MED ORDER — SODIUM CHLORIDE 0.9 % IV SOLN: 10.0000 mL/h | Freq: Once | INTRAVENOUS | Status: DC

## 2018-04-14 MED ORDER — STROKE: EARLY STAGES OF RECOVERY BOOK
Freq: Once | Status: AC
  Administered 2018-04-14: 17:00:00

## 2018-04-14 MED ORDER — ACETAMINOPHEN 325 MG PO TABS: 650.0000 mg | ORAL_TABLET | ORAL | Status: DC | PRN

## 2018-04-14 MED ORDER — SIMVASTATIN 20 MG PO TABS: 20.0000 mg | ORAL_TABLET | Freq: Every day | ORAL | Status: DC

## 2018-04-14 MED ORDER — ATORVASTATIN CALCIUM 40 MG PO TABS
40.0000 mg | ORAL_TABLET | Freq: Every day | ORAL | Status: DC
  Administered 2018-04-14: 40 mg via ORAL
  Filled 2018-04-14: qty 1

## 2018-04-14 NOTE — Social Work (Signed)
CSW acknowledging consult for SNF placement. Will follow for therapy recommendations.   Lanijah Warzecha, MSW, LCSWA [place] [facility] Social Work [phone]   

## 2018-04-14 NOTE — Consult Note (Addendum)
Neurology Consultation  Reason for Consult: Possible TIA/stroke Referring Physician: Dr. Olevia BowensMorelli  CC: Right leg giving way  History is obtained from: Daughter and chart  HPI: Kristen Parks is a 77 y.o. female with history of hypertension, dyslipidemia, dementia, anemia and hypothyroidism.  At approximately 7:30 in the morning her daughters noted they heard a thud and found patient on the floor with her right leg up underneath her.  Patient is unable to give a good history, and her history completely changes from 1 minute to the next.  She is told people in the ED that was her left leg and she is adamant it was her right leg.  Due to patient complaining of numbness of her leg and hand at that time her daughter brought her to the hospital concern stroke.  At this time when I asked the patient she states that she had no numbness but her leg gave out.  When daughter tried to pick her up she stated that she was unable to grab secondary to both legs giving out.  Internal medicine asked the family what she ate on daily basis and apparently she does not eat much and when she does eat is mostly snacks, sweets, honey nut Cheerios.   ED course: As noted above, patient's history is extremely unreliable.  Apparently when she came to the ED she was saying that she had left arm and leg weakness since yesterday afternoon.  While in the ED her CBC showed white blood cell count 4.6, red blood cell count of 3.55, hemoglobin of 5.5, hematocrit of 22.9, MCV of 64.5, RDW of 20.5, and platelet count of 433.   LKW: Unclear as history is unreliable.  However the daughter state last known normal was approximately 730 this morning tpa given?: no, symptoms resolved Premorbid modified Rankin scale (mRS): 0 NIH stroke scale of 1   ROS:  ROS was performed and is negative except as noted in the HPI.   Past Medical History:  Diagnosis Date  . Acquired hypothyroidism   . Dementia (HCC)   . Dyslipidemia   . Essential  hypertension     History reviewed. No pertinent family history.  Social History:   reports that she has quit smoking. She has a 10.00 pack-year smoking history. She has never used smokeless tobacco. She reports that she does not drink alcohol or use drugs.  Medications  Current Facility-Administered Medications:  .  0.9 %  sodium chloride infusion, 10 mL/hr, Intravenous, Once, Harlene SaltsMorelli, Brandon A, PA-C  Current Outpatient Medications:  .  amLODipine (NORVASC) 5 MG tablet, Take 5 mg by mouth daily., Disp: , Rfl:  .  gabapentin (NEURONTIN) 300 MG capsule, Take 300 mg by mouth at bedtime. , Disp: , Rfl:  .  hydrOXYzine (ATARAX/VISTARIL) 25 MG tablet, Take 25 mg by mouth at bedtime as needed for itching., Disp: , Rfl:  .  ibuprofen (ADVIL,MOTRIN) 200 MG tablet, Take 400 mg by mouth as needed for headache or moderate pain., Disp: , Rfl:  .  levothyroxine (SYNTHROID, LEVOTHROID) 112 MCG tablet, Take 112 mcg by mouth daily. , Disp: , Rfl:  .  omeprazole (PRILOSEC) 40 MG capsule, Take 40 mg by mouth daily., Disp: , Rfl:  .  simvastatin (ZOCOR) 20 MG tablet, Take 20 mg by mouth at bedtime. , Disp: , Rfl:    Exam: Current vital signs: Ht 5\' 2"  (1.575 m)   Wt 70.3 kg   BMI 28.35 kg/m  Vital signs in last 24 hours: Weight:  [  70.3 kg] 70.3 kg (12/31 [id number])  Physical Exam  Constitutional: Appears well-developed.  Psych: Affect appropriate to situation Eyes: No scleral injection HENT: No OP obstrucion Head: Normocephalic.  Cardiovascular: Normal rate and regular rhythm.  Respiratory: Effort normal, non-labored breathing GI: Soft.  No distension. There is no tenderness.  Skin: WDI  Neuro: Mental Status: Patient is awake, alert, oriented to person, place, month, not year or situation.  In addition her history distantly changes Patient is unable to give a clear and coherent history. No signs of aphasia or neglect Cranial Nerves: II: Visual Fields are full.  III,IV, VI: EOMI without  ptosis or diploplia. Pupils are equal, round, and reactive to light.   V: Facial sensation is symmetric to temperature VII: Facial movement is symmetric.  VIII: hearing is intact to voice X: Uvula elevates symmetrically XI: Shoulder shrug is symmetric. XII: tongue is midline without atrophy or fasciculations.  Motor: Tone is normal. Bulk is decreased . 5/5 strength was present in all four extremities.  Sensory: Patient stated that she could feel greater sensation on her feet than her upper thighs bilaterally.  deep Tendon Reflexes: 2+ and symmetric in the biceps and patellae.  Plantars: Toes are downgoing bilaterally. Cerebellar: FNF and HKS are intact bilaterally  Labs I have reviewed labs in epic and the results pertinent to this consultation are:   CBC    Component Value Date/Time   WBC 4.6 04/14/2018 0935   RBC 3.49 (L) 04/14/2018 1041   RBC 3.55 (L) 04/14/2018 0935   HGB 7.5 (L) 04/14/2018 0947   HCT 22.0 (L) 04/14/2018 0947   PLT 433 (H) 04/14/2018 0935   MCV 64.5 (L) 04/14/2018 0935   MCH 15.5 (L) 04/14/2018 0935   MCHC 24.0 (L) 04/14/2018 0935   RDW 20.5 (H) 04/14/2018 0935   LYMPHSABS 1.0 04/14/2018 0935   MONOABS 0.6 04/14/2018 0935   EOSABS 0.1 04/14/2018 0935   BASOSABS 0.1 04/14/2018 0935    CMP     Component Value Date/Time   NA 142 04/14/2018 0947   K 3.7 04/14/2018 0947   CL 110 04/14/2018 0947   CO2 21 (L) 04/14/2018 0935   GLUCOSE 96 04/14/2018 0947   BUN 15 04/14/2018 0947   CREATININE 0.70 04/14/2018 0947   CALCIUM 9.1 04/14/2018 0935   PROT 6.9 04/14/2018 0935   ALBUMIN 3.6 04/14/2018 0935   AST 18 04/14/2018 0935   ALT 11 04/14/2018 0935   ALKPHOS 90 04/14/2018 0935   BILITOT 0.4 04/14/2018 0935   GFRNONAA >60 04/14/2018 0935   GFRAA >60 04/14/2018 0935    Lipid Panel  No results found for: CHOL, TRIG, HDL, CHOLHDL, VLDL, LDLCALC, LDLDIRECT   Imaging I have reviewed the images obtained:  CT-scan of the brain-age-related  cerebral atrophy, ventriculomegaly and periventricular white matter disease.  No acute intracranial findings or skull fractures.  MRI examination of the brain-pending  Felicie MornDavid Smith PA-C [facility] [phone]  M-F  (9:00 am- 5:00 PM)  04/14/2018, 11:38 AM   I have seen the patient reviewed the above note.  She has had significant improvement, feeling like she is essentially back to her normal self.  On exam, she has symmetric strength in the bilateral lower extremities.  Assessment:  77 year old female who presents with weakness who has an area of infarction on MRI.  She did have severe anemia which may have contributed.  Her improvement is reassuring, but she will need work-up.  Recommendations: # MRI of the brain without contrast #MRA  Head and neck  #Transthoracic Echo,   # Start patient on ASA 325mg  daily,  #continue Atorvastatin 80 mg/other high intensity statin # BP goal: permissive HTN upto 220/120 mmHg # HBAIC and Lipid profile # Telemetry monitoring # Frequent neuro checks # NPO until passes stroke swallow screen # please page stroke NP  Or  PA  Or MD from 8am -4 pm  as this patient from this time will be  followed by the stroke.   You can look them up on [email or web address]  Password [email or web address]  Ritta Slot, MD [facility] [phone]  If 7pm- 7am, please page neurology on call as listed in AMION.

## 2018-04-14 NOTE — ED Notes (Signed)
Per admitting MD Ophelia CharterYates, patient may transport to MRI without blood transfusion at this time and initiate transfusion when MRI completed. MRI notified and sending for patient at this time. VSS.

## 2018-04-14 NOTE — ED Provider Notes (Addendum)
[facility]   CSN: [id number] Arrival date & time: 04/14/18  [id number]     History   Chief Complaint Chief Complaint  Patient presents with  . stroke like symptoms    HPI Kristen Parks is a 77 y.o. female presenting today with her daughter and granddaughter for left-sided numbness and fall.  Patient reports that she began experiencing left arm and leg numbness sometime during the afternoon yesterday.  Patient is unclear about what time that her symptoms started yesterday.  Family at bedside reports that patient has been having increasing trouble with short-term memory over the past few months.  Patient numbness came to the attention of the family this morning after patient came back inside from walking her dogs.  Granddaughter heard the patient fall and went to go check on her and help her get off the ground.  Upon helping patient off the ground granddaughter reports that the patient stated that her left leg did not feel like hers which is why they report to the emergency department this morning.  Patient denies injury or pain from her fall, she states that she did not lose consciousness but she does not remember the fall either.  She denies headache, neck pain, chest/back pain, hip pain or pain to the extremities.  On arrival patient resting comfortably and in no acute distress.  Patient reports symptoms have been going on since yesterday afternoon.  Initial evaluation reveals left arm and leg weakness.  Patient without visual field deficit, no evidence of a aphasia, there is no neglect, patient reports that her leg does not feel like her own due to numbness however she is able to identify her extremities and move her left leg and arm on command without ataxia however with some increased weakness.  Patient's daughter and granddaughter at bedside deny facial asymmetry or change to patient's speech. HPI  Past Medical History:  Diagnosis  Date  . Acquired hypothyroidism   . Dementia (HCC)   . Dyslipidemia   . Essential hypertension     Patient Active Problem List   Diagnosis Date Noted  . Symptomatic anemia 04/14/2018    Past Surgical History:  Procedure Laterality Date  . CARPAL TUNNEL RELEASE       OB History    Gravida  6   Para  5   Term      Preterm      AB      Living  5     SAB      TAB      Ectopic      Multiple      Live Births               Home Medications    Prior to Admission medications   Medication Sig Start Date End Date Taking? Authorizing Provider  amLODipine (NORVASC) 5 MG tablet Take 5 mg by mouth daily.   Yes [provider]  gabapentin (NEURONTIN) 300 MG capsule Take 300 mg by mouth at bedtime.    Yes [provider]  hydrOXYzine (ATARAX/VISTARIL) 25 MG tablet Take 25 mg by mouth at bedtime as needed for itching.   Yes [provider]  ibuprofen (ADVIL,MOTRIN) 200 MG tablet Take 400 mg by mouth as needed for headache or moderate pain.   Yes [provider]  levothyroxine (SYNTHROID, LEVOTHROID) 112 MCG tablet Take 112 mcg by mouth daily.    Yes [provider]  omeprazole (PRILOSEC) 40  MG capsule Take 40 mg by mouth daily.   Yes [provider]  simvastatin (ZOCOR) 20 MG tablet Take 20 mg by mouth at bedtime.    Yes [provider]    Family History History reviewed. No pertinent family history.  Social History Social History   Tobacco Use  . Smoking status: Former Smoker    Packs/day: 0.50    Years: 20.00    Pack years: 10.00  . Smokeless tobacco: Never Used  Substance Use Topics  . Alcohol use: No  . Drug use: No     Allergies   Latex; Penicillins; and Sulfa antibiotics   Review of Systems Review of Systems  Constitutional: Negative.  Negative for chills and fever.  Eyes: Negative.  Negative for visual disturbance.  Respiratory: Negative.  Negative for shortness of breath.     Cardiovascular: Negative.  Negative for chest pain.  Gastrointestinal: Negative.  Negative for abdominal pain, diarrhea, nausea and vomiting.  Genitourinary: Negative.  Negative for dysuria and hematuria.  Musculoskeletal: Negative.  Negative for back pain and neck pain.  Skin: Negative.  Negative for wound.  Neurological: Positive for weakness (Left arm, left leg) and numbness (Left arm, left leg). Negative for dizziness, syncope, facial asymmetry, speech difficulty and headaches.  All other systems reviewed and are negative.  Physical Exam Updated Vital Signs BP (!) 116/55   Pulse 72   Resp 18   Ht 5\' 2"  (1.575 m)   Wt 70.3 kg   SpO2 100%   BMI 28.35 kg/m   Physical Exam Constitutional:      General: She is not in acute distress.    Appearance: Normal appearance. She is not ill-appearing or diaphoretic.  HENT:     Head: Normocephalic and atraumatic. No raccoon eyes or Battle's sign.     Jaw: There is normal jaw occlusion.     Right Ear: Hearing, tympanic membrane, ear canal and external ear normal. No hemotympanum.     Left Ear: Hearing, tympanic membrane, ear canal and external ear normal. No hemotympanum.     Nose: Nose normal.     Mouth/Throat:     Lips: Pink.     Pharynx: Oropharynx is clear. Uvula midline.  Eyes:     General: Vision grossly intact. Gaze aligned appropriately. No visual field deficit.    Extraocular Movements: Extraocular movements intact.     Conjunctiva/sclera: Conjunctivae normal.  Neck:     Musculoskeletal: Full passive range of motion without pain, normal range of motion and neck supple.     Trachea: Trachea and phonation normal. No tracheal deviation.  Cardiovascular:     Rate and Rhythm: Normal rate and regular rhythm.     Pulses:          Radial pulses are 2+ on the right side and 2+ on the left side.       Dorsalis pedis pulses are 2+ on the right side and 2+ on the left side.       Posterior tibial pulses are 2+ on the right side and 2+  on the left side.     Heart sounds: Normal heart sounds.  Pulmonary:     Effort: Pulmonary effort is normal.     Breath sounds: Normal breath sounds and air entry. No decreased breath sounds.  Chest:     Chest wall: No deformity, tenderness or crepitus.     Comments: No signs of injury Abdominal:     General: Bowel sounds are normal.  Palpations: Abdomen is soft.     Tenderness: There is no abdominal tenderness. There is no guarding or rebound.     Comments: No signs of injury  Genitourinary:    Comments: Rectal examination prone by Ladona Ridgel nurse tech.  No gross blood present, rectal tone normal.  No evidence of trauma to patient's glutes/hips. Musculoskeletal:     Comments: No midline C/T/L spinal tenderness to palpation, no paraspinal muscle tenderness, no deformity, crepitus, or step-off noted. No sign of injury to the neck or back.  Hips stable to compression bilaterally.  Range of motion of hips without crepitus or deformity, passively brought patient's knee-to-chest without pain.  All major joints palpated and brought through range of motion without deformity or increased pain.  Feet:     Right foot:     Protective Sensation: 3 sites tested. 3 sites sensed.     Left foot:     Protective Sensation: 3 sites tested. 3 sites sensed.     Comments: Endorses decreased sensation to left foot Skin:    General: Skin is warm and dry.     Capillary Refill: Capillary refill takes less than 2 seconds.  Neurological:     Mental Status: She is alert and oriented to person, place, and time.     GCS: GCS eye subscore is 4. GCS verbal subscore is 5. GCS motor subscore is 6.     Comments: Mental Status: Alert, oriented, thought content appropriate, able to give a coherent history. Speech fluent without evidence of aphasia. Able to follow 2 step commands without difficulty. Cranial Nerves: II: Peripheral visual fields grossly normal, pupils equal, round, reactive to light III,IV, VI:  ptosis not present, extra-ocular motions intact bilaterally V,VII: smile symmetric, eyebrows raise symmetric, facial light touch sensation equal VIII: hearing grossly normal to voice X: uvula elevates symmetrically XI: bilateral shoulder shrug symmetric and strong XII: midline tongue extension without fassiculations Motor: Normal tone. 5/5 strength in right upper and lower extremities; 2/5 strength in left upper and lower extremities, grip and dorsi/plantar flexion. Sensory: Decreased sensation to light touch of the left hand and foot. Cerebellar: normal finger-to-nose with bilateral upper extremities. No pronator drift.  CV: distal pulses palpable throughout    ED Treatments / Results  Labs (all labs ordered are listed, but only abnormal results are displayed) Labs Reviewed  COMPREHENSIVE METABOLIC PANEL - Abnormal; Notable for the following components:      Result Value   CO2 21 (*)    All other components within normal limits  CBC WITH DIFFERENTIAL/PLATELET - Abnormal; Notable for the following components:   RBC 3.55 (*)    Hemoglobin 5.5 (*)    HCT 22.9 (*)    MCV 64.5 (*)    MCH 15.5 (*)    MCHC 24.0 (*)    RDW 20.5 (*)    Platelets 433 (*)    All other components within normal limits  IRON AND TIBC - Abnormal; Notable for the following components:   Iron 10 (*)    Saturation Ratios 2 (*)    All other components within normal limits  FERRITIN - Abnormal; Notable for the following components:   Ferritin 3 (*)    All other components within normal limits  RETICULOCYTES - Abnormal; Notable for the following components:   RBC. 3.49 (*)    Immature Retic Fract 31.6 (*)    All other components within normal limits  I-STAT CHEM 8, ED - Abnormal; Notable for the following components:  Hemoglobin 7.5 (*)    HCT 22.0 (*)    All other components within normal limits  ETHANOL  PROTIME-INR  APTT  VITAMIN B12  SAVE SMEAR (SSMR)  RAPID URINE DRUG SCREEN, HOSP PERFORMED    URINALYSIS, ROUTINE W REFLEX MICROSCOPIC  FOLATE  CBC  I-STAT TROPONIN, ED  POC OCCULT BLOOD, ED  TYPE AND SCREEN  PREPARE RBC (CROSSMATCH)  ABO/RH    EKG None  Radiology Ct Head Wo Contrast  Result Date: 04/14/2018 CLINICAL DATA:  Larey Seat.  Lower extremity numbness. EXAM: CT HEAD WITHOUT CONTRAST CT CERVICAL SPINE WITHOUT CONTRAST TECHNIQUE: Multidetector CT imaging of the head and cervical spine was performed following the standard protocol without intravenous contrast. Multiplanar CT image reconstructions of the cervical spine were also generated. COMPARISON:  None. FINDINGS: CT HEAD FINDINGS Brain: Age related cerebral atrophy, ventriculomegaly and periventricular white matter disease. No extra-axial fluid collections are identified. No CT findings for acute hemispheric infarction or intracranial hemorrhage. No mass lesions. The brainstem and cerebellum are normal. Vascular: Minimal vascular calcifications. No obvious aneurysm or hyperdense vessels. Skull: No skull fracture or bone lesion. Mild hyperostosis frontalis interna. Sinuses/Orbits: The paranasal sinuses and mastoid air cells are clear. The globes are intact. Other: No scalp lesions or hematoma. CT CERVICAL SPINE FINDINGS Alignment: Normal overall alignment. Mild degenerative cervical spondylosis with multilevel disc disease and facet disease. Skull base and vertebrae: The skull base C1 and C1-2 articulations are maintained. Moderate degenerative changes at C1-2. No dens fracture. No vertebral body, facet or laminar fractures. Soft tissues and spinal canal: No prevertebral fluid or swelling. No visible canal hematoma. Disc levels: Generous spinal canal without findings for spinal or foraminal stenosis. No large disc protrusions. Upper chest: The lung apices are grossly clear. Other: No neck mass or lymphadenopathy. IMPRESSION: 1. Age related cerebral atrophy, ventriculomegaly and periventricular white matter disease. 2. No acute  intracranial findings or skull fracture. 3. Normal alignment of the cervical vertebral bodies and no acute cervical spine fracture. Generous spinal without obvious large disc protrusion or canal compromise. Electronically Signed   By: Rudie Meyer M.D.   On: 04/14/2018 10:00   Ct Cervical Spine Wo Contrast  Result Date: 04/14/2018 CLINICAL DATA:  Larey Seat.  Lower extremity numbness. EXAM: CT HEAD WITHOUT CONTRAST CT CERVICAL SPINE WITHOUT CONTRAST TECHNIQUE: Multidetector CT imaging of the head and cervical spine was performed following the standard protocol without intravenous contrast. Multiplanar CT image reconstructions of the cervical spine were also generated. COMPARISON:  None. FINDINGS: CT HEAD FINDINGS Brain: Age related cerebral atrophy, ventriculomegaly and periventricular white matter disease. No extra-axial fluid collections are identified. No CT findings for acute hemispheric infarction or intracranial hemorrhage. No mass lesions. The brainstem and cerebellum are normal. Vascular: Minimal vascular calcifications. No obvious aneurysm or hyperdense vessels. Skull: No skull fracture or bone lesion. Mild hyperostosis frontalis interna. Sinuses/Orbits: The paranasal sinuses and mastoid air cells are clear. The globes are intact. Other: No scalp lesions or hematoma. CT CERVICAL SPINE FINDINGS Alignment: Normal overall alignment. Mild degenerative cervical spondylosis with multilevel disc disease and facet disease. Skull base and vertebrae: The skull base C1 and C1-2 articulations are maintained. Moderate degenerative changes at C1-2. No dens fracture. No vertebral body, facet or laminar fractures. Soft tissues and spinal canal: No prevertebral fluid or swelling. No visible canal hematoma. Disc levels: Generous spinal canal without findings for spinal or foraminal stenosis. No large disc protrusions. Upper chest: The lung apices are grossly clear. Other: No neck mass or lymphadenopathy.  IMPRESSION: 1. Age  related cerebral atrophy, ventriculomegaly and periventricular white matter disease. 2. No acute intracranial findings or skull fracture. 3. Normal alignment of the cervical vertebral bodies and no acute cervical spine fracture. Generous spinal without obvious large disc protrusion or canal compromise. Electronically Signed   By: Rudie Meyer M.D.   On: 04/14/2018 10:00    Procedures .Critical Care Performed by: Bill Salinas, PA-C Authorized by: Bill Salinas, PA-C   Critical care provider statement:    Critical care time (minutes):  31   Critical care was necessary to treat or prevent imminent or life-threatening deterioration of the following conditions:  Circulatory failure (Anemia, hgb 5.5)   Critical care was time spent personally by me on the following activities:  Discussions with consultants, evaluation of patient's response to treatment, examination of patient, ordering and performing treatments and interventions, ordering and review of laboratory studies, ordering and review of radiographic studies, pulse oximetry, re-evaluation of patient's condition, obtaining history from patient or surrogate and review of old charts   (including critical care time)  Medications Ordered in ED Medications  0.9 %  sodium chloride infusion (has no administration in time range)  simvastatin (ZOCOR) tablet 20 mg (has no administration in time range)  hydrOXYzine (ATARAX/VISTARIL) tablet 25 mg (has no administration in time range)  levothyroxine (SYNTHROID, LEVOTHROID) tablet 112 mcg (has no administration in time range)  pantoprazole (PROTONIX) EC tablet 40 mg (has no administration in time range)  gabapentin (NEURONTIN) capsule 300 mg (has no administration in time range)   stroke: mapping our early stages of recovery book (has no administration in time range)  0.9 %  sodium chloride infusion (has no administration in time range)  acetaminophen (TYLENOL) tablet 650 mg (has no  administration in time range)    Or  acetaminophen (TYLENOL) solution 650 mg (has no administration in time range)    Or  acetaminophen (TYLENOL) suppository 650 mg (has no administration in time range)  senna-docusate (Senokot-S) tablet 1 tablet (has no administration in time range)     Initial Impression / Assessment and Plan / ED Course  I have reviewed the triage vital signs and the nursing notes.  Pertinent labs & imaging results that were available during my care of the patient were reviewed by me and considered in my medical decision making (see chart for details).    73:74 AM: 76 year old female coming in today for left-sided arm and leg weakness/tingling that began sometime yesterday.  She is unable to identify exact time that symptoms began, states it was sometime in the afternoon.  Family who comes in with patient today states that patient had fall this morning after walking the dogs and upon checking on the patient they reported that the patient stated that she feels like her left leg was not hers and could not stand up.  On my evaluation patient with left arm and leg weakness, there is no visual field deficit, no a aphasia and no neglect.  Patient is able to identify her extremities and moves them without ataxia, patient states that it just feels like it is not her leg because of the tingling.  Plan at this time is to obtain CT and blood work, consult to neurology called.  No obvious trauma from fall. ----------------------------------------------- 9:23 AM: Case discussed with neurology, Dr. Amada Jupiter, agrees with work-up at this time including CT head, advises to perform MRI after CT.  Agrees patient out of the window for TPA and VAN negative.  Dr. Amada Jupiter to see patient. ------------------------------------------------ Case discussed with Dr. Jacqulyn Bath, agrees with work-up followed by MRI and admission. ------------------------------------------------ Troponin negative CBC with  anemia 5.5 CMP nonacute Ethanol negative PT INR within normal limits APTT within normal limits UDS/urinalysis pending ---------------------------------------- 10:30 AM: CT head and cervical spine without acute findings, MRI has been ordered, consult to hospitalist has been ordered.  Patient noted to be anemic to 5.5, discussed with patient, no known anemia and no bleeding symptoms.  Have performed occult stool sent to lab.  Discussed with Dr. Jacqulyn Bath, advised anemia panel, smear, type and screen and to give transfusion here in emergency department.  Discussed with patient and family who agree with care plan. ------------------------------------------------- 10:54 AM: Called to hospitalist Dr. Ophelia Charter who will see patient in the emergency department. ------------------------------------------------ Hemoccult negative, rectal examination chaperoned by Ladona Ridgel nurse tech. -------------------------------------------------- Patient has been admitted to hospitalist service by Dr. Ophelia Charter, MRI pending at this time.   Note: Portions of this report may have been transcribed using voice recognition software. Every effort was made to ensure accuracy; however, inadvertent computerized transcription errors may still be present. Final Clinical Impressions(s) / ED Diagnoses   Final diagnoses:  Left-sided weakness  Anemia, unspecified type    ED Discharge Orders    None       Elizabeth Palau 04/14/18 1207    Bill Salinas, PA-C 04/14/18 1209    Elizabeth Palau 04/14/18 1310    Maia Plan, MD 04/14/18 [phone]

## 2018-04-14 NOTE — Progress Notes (Signed)
PT Cancellation Note  Patient Details Name: Kristen Parks MRN: [id number] DOB: 04/01/1941   Cancelled Treatment:    Reason Eval/Treat Not Completed: Medical issues which prohibited therapy; patient with hemoglobin 5.3 and RN reports just now starting transfusion.  Will attempt for PT another day.   Elray McgregorCynthia Narada Uzzle 04/14/2018, 4:58 PM  Sheran Lawlessyndi Marqual Mi, PT Acute [facility] Services [phone] 04/14/2018

## 2018-04-14 NOTE — ED Notes (Signed)
Pt transporting to MRI.  

## 2018-04-14 NOTE — H&P (Signed)
History and Physical    Kristen Parks VWU:[id number] DOB: 25-Jan-1941 DOA: 04/14/2018  PCP: Jarome Matin, MD Consultants:  Charlsie Merles - podiatry Patient coming from:  Home - lives with daughter and granddaughter; NOK: Daughter, [phone]  Chief Complaint: Stroke-like symptoms  HPI: Kristen Parks is a 77 y.o. female with no significant past medical history presenting with stroke-like symptoms.  She has dementia and can't provide history.  She came in from walking her dog and her granddaughter heard a thunk.  She fell to the floor in a sitting position.  She complained that her left leg and arm felt like they didn't belong to her.  She reported that her hand felt that way yesterday but she didn't report it to family until this AM.  Her family didn't notice problems yesterday.  She also walked up the steps after walking the dog without difficulty, so her granddaughter thinks it just started.  No facial droop, no dysphagia or dysarthria.  She has been complaining of feeling tired, family thought she wasn't getting enough sleep.  She doesn't eat well - she eats what she wants which is mostly snacks, sweets, and honey nut cheerios.  No apparent DOE.   ED Course:   Left arm/leg weakness since yesterday afternoon.  Patient reported it to family today, but had fall from weakness.  Left-sided arm and leg weakness on exam.  No visual deficit or speech issue.  Dr. Amada Jupiter consulted - MRI pending.  Hgb 5.5, no bleeding, heme negative.  Giving 2 units PRBC.  No CP, SOB.  Review of Systems: As per HPI; otherwise review of systems reviewed and negative.   Ambulatory Status:  Ambulates without assistance  Past Medical History:  Diagnosis Date  . Acquired hypothyroidism   . Dementia (HCC)   . Dyslipidemia   . Essential hypertension   . Peripheral neuropathy     Past Surgical History:  Procedure Laterality Date  . CARPAL TUNNEL RELEASE      Social History   Socioeconomic History  . Marital  status: Divorced    Spouse name: Not on file  . Number of children: Not on file  . Years of education: Not on file  . Highest education level: Not on file  Occupational History  . Occupation: retired  Engineer, production  . Financial resource strain: Not on file  . Food insecurity:    Worry: Not on file    Inability: Not on file  . Transportation needs:    Medical: Not on file    Non-medical: Not on file  Tobacco Use  . Smoking status: Former Smoker    Packs/day: 0.50    Years: 20.00    Pack years: 10.00  . Smokeless tobacco: Never Used  Substance and Sexual Activity  . Alcohol use: No  . Drug use: No  . Sexual activity: Yes    Birth control/protection: Post-menopausal  Lifestyle  . Physical activity:    Days per week: Not on file    Minutes per session: Not on file  . Stress: Not on file  Relationships  . Social connections:    Talks on phone: Not on file    Gets together: Not on file    Attends religious service: Not on file    Active member of club or organization: Not on file    Attends meetings of clubs or organizations: Not on file    Relationship status: Not on file  . Intimate partner violence:    Fear of current  or ex partner: Not on file    Emotionally abused: Not on file    Physically abused: Not on file    Forced sexual activity: Not on file  Other Topics Concern  . Not on file  Social History Narrative  . Not on file    Allergies  Allergen Reactions  . Latex   . Penicillins     REACTION: vomit  . Sulfa Antibiotics     History reviewed. No pertinent family history.  Prior to Admission medications   Medication Sig Start Date End Date Taking? Authorizing Provider  amLODipine (NORVASC) 5 MG tablet Take 5 mg by mouth daily.   Yes [provider]  gabapentin (NEURONTIN) 300 MG capsule Take 300 mg by mouth at bedtime.    Yes [provider]  hydrOXYzine (ATARAX/VISTARIL) 25 MG tablet Take 25 mg by mouth at bedtime as needed for itching.    Yes [provider]  ibuprofen (ADVIL,MOTRIN) 200 MG tablet Take 400 mg by mouth as needed for headache or moderate pain.   Yes [provider]  levothyroxine (SYNTHROID, LEVOTHROID) 112 MCG tablet Take 112 mcg by mouth daily.    Yes [provider]  omeprazole (PRILOSEC) 40 MG capsule Take 40 mg by mouth daily.   Yes [provider]  simvastatin (ZOCOR) 20 MG tablet Take 20 mg by mouth at bedtime.    Yes [provider]    Physical Exam: Vitals:   04/14/18 0922 04/14/18 1205 04/14/18 1230 04/14/18 1645  BP:  (!) 116/55 (!) 121/58 131/68  Pulse:  72 73 78  Resp:  18 12   Temp:    99.3 F (37.4 C)  TempSrc:    Oral  SpO2:  100% 99% 100%  Weight: 70.3 kg     Height: 5\' 2"  (1.575 m)        General: Quite cantankerous with confusion Eyes:  PERRL, EOMI, normal lids, iris ENT:  Hard of hearing, normal lips & tongue, mmm Neck:  no LAD, masses or thyromegaly; no carotid bruits Cardiovascular:  RRR, no m/r/g. No LE edema.  Respiratory:   CTA bilaterally with no wheezes/rales/rhonchi.  Normal respiratory effort. Abdomen:  soft, NT, ND, NABS Back:   normal alignment, no CVAT Skin:  no rash or induration seen on limited exam Musculoskeletal:  grossly normal tone BUE/BLE, good ROM, no bony abnormality Psychiatric:  cantankerous mood and affect, speech fluent and appropriate, AOx1 Neurologic:  CN 2-12 grossly intact, moves all extremities in coordinated fashion, sensation intact    Radiological Exams on Admission: Ct Head Wo Contrast  Result Date: 04/14/2018 CLINICAL DATA:  Larey SeatFell.  Lower extremity numbness. EXAM: CT HEAD WITHOUT CONTRAST CT CERVICAL SPINE WITHOUT CONTRAST TECHNIQUE: Multidetector CT imaging of the head and cervical spine was performed following the standard protocol without intravenous contrast. Multiplanar CT image reconstructions of the cervical spine were also generated. COMPARISON:  None. FINDINGS: CT HEAD FINDINGS Brain:  Age related cerebral atrophy, ventriculomegaly and periventricular white matter disease. No extra-axial fluid collections are identified. No CT findings for acute hemispheric infarction or intracranial hemorrhage. No mass lesions. The brainstem and cerebellum are normal. Vascular: Minimal vascular calcifications. No obvious aneurysm or hyperdense vessels. Skull: No skull fracture or bone lesion. Mild hyperostosis frontalis interna. Sinuses/Orbits: The paranasal sinuses and mastoid air cells are clear. The globes are intact. Other: No scalp lesions or hematoma. CT CERVICAL SPINE FINDINGS Alignment: Normal overall alignment. Mild degenerative cervical spondylosis with multilevel disc disease and facet disease. Skull  base and vertebrae: The skull base C1 and C1-2 articulations are maintained. Moderate degenerative changes at C1-2. No dens fracture. No vertebral body, facet or laminar fractures. Soft tissues and spinal canal: No prevertebral fluid or swelling. No visible canal hematoma. Disc levels: Generous spinal canal without findings for spinal or foraminal stenosis. No large disc protrusions. Upper chest: The lung apices are grossly clear. Other: No neck mass or lymphadenopathy. IMPRESSION: 1. Age related cerebral atrophy, ventriculomegaly and periventricular white matter disease. 2. No acute intracranial findings or skull fracture. 3. Normal alignment of the cervical vertebral bodies and no acute cervical spine fracture. Generous spinal without obvious large disc protrusion or canal compromise. Electronically Signed   By: Rudie Meyer M.D.   On: 04/14/2018 10:00   Ct Cervical Spine Wo Contrast  Result Date: 04/14/2018 CLINICAL DATA:  Larey Seat.  Lower extremity numbness. EXAM: CT HEAD WITHOUT CONTRAST CT CERVICAL SPINE WITHOUT CONTRAST TECHNIQUE: Multidetector CT imaging of the head and cervical spine was performed following the standard protocol without intravenous contrast. Multiplanar CT image reconstructions  of the cervical spine were also generated. COMPARISON:  None. FINDINGS: CT HEAD FINDINGS Brain: Age related cerebral atrophy, ventriculomegaly and periventricular white matter disease. No extra-axial fluid collections are identified. No CT findings for acute hemispheric infarction or intracranial hemorrhage. No mass lesions. The brainstem and cerebellum are normal. Vascular: Minimal vascular calcifications. No obvious aneurysm or hyperdense vessels. Skull: No skull fracture or bone lesion. Mild hyperostosis frontalis interna. Sinuses/Orbits: The paranasal sinuses and mastoid air cells are clear. The globes are intact. Other: No scalp lesions or hematoma. CT CERVICAL SPINE FINDINGS Alignment: Normal overall alignment. Mild degenerative cervical spondylosis with multilevel disc disease and facet disease. Skull base and vertebrae: The skull base C1 and C1-2 articulations are maintained. Moderate degenerative changes at C1-2. No dens fracture. No vertebral body, facet or laminar fractures. Soft tissues and spinal canal: No prevertebral fluid or swelling. No visible canal hematoma. Disc levels: Generous spinal canal without findings for spinal or foraminal stenosis. No large disc protrusions. Upper chest: The lung apices are grossly clear. Other: No neck mass or lymphadenopathy. IMPRESSION: 1. Age related cerebral atrophy, ventriculomegaly and periventricular white matter disease. 2. No acute intracranial findings or skull fracture. 3. Normal alignment of the cervical vertebral bodies and no acute cervical spine fracture. Generous spinal without obvious large disc protrusion or canal compromise. Electronically Signed   By: Rudie Meyer M.D.   On: 04/14/2018 10:00   Mr Brain Wo Contrast  Result Date: 04/14/2018 CLINICAL DATA:  77 y/o F; left-sided weakness and numbness with fall. EXAM: MRI HEAD WITHOUT CONTRAST MRA HEAD WITHOUT CONTRAST TECHNIQUE: Multiplanar, multiecho pulse sequences of the brain and surrounding  structures were obtained without intravenous contrast. Angiographic images of the head were obtained using MRA technique without contrast. COMPARISON:  04/14/2018 CT head FINDINGS: MRI HEAD FINDINGS Brain: Focus of reduced diffusion compatible with acute/early subacute infarction spanning approximately 2 cm within left body of corpus callosum and cingulate gyrus (series 3, image 29 and series 650, image 14). No associated hemorrhage or mass effect. Several nonspecific T2 FLAIR hyperintensities in predominantly periventricular white matter are compatible with mild to moderate chronic microvascular ischemic changes for age. Mild volume loss of the brain. Small chronic infarcts are present within the left cerebellar hemisphere and the bilateral thalami. No extra-axial collection, hydrocephalus, mass effect, or herniation. Vascular: Normal flow voids. Skull and upper cervical spine: Normal marrow signal. Sinuses/Orbits: Negative. Other: Bilateral intra-ocular lens replacement. MRA HEAD  FINDINGS Internal carotid arteries:  Patent. Anterior cerebral arteries:  Patent. Middle cerebral arteries: Patent. Anterior communicating artery: Patent. Posterior communicating arteries: Patent right. No left identified, likely hypoplastic or absent. Posterior cerebral arteries:  Patent. Basilar artery:  Patent. Vertebral arteries:  Patent. No evidence of high-grade stenosis, large vessel occlusion, or aneurysm unless noted above. IMPRESSION: MRI head: 1. Small acute/early subacute infarction involving left body of corpus callosum and cingulate gyrus. No associated hemorrhage or mass effect. 2. Mild-to-moderate chronic microvascular ischemic changes and mild volume loss of the brain. MRA head: Patent anterior and posterior intracranial circulation. No large vessel occlusion, aneurysm, or significant stenosis is identified. These results will be called to the ordering clinician or representative by the Radiologist Assistant, and  communication documented in the PACS or zVision Dashboard. Electronically Signed   By: Mitzi Hansen M.D.   On: 04/14/2018 14:04   Mr Maxine Glenn Head Wo Contrast  Result Date: 04/14/2018 CLINICAL DATA:  77 y/o F; left-sided weakness and numbness with fall. EXAM: MRI HEAD WITHOUT CONTRAST MRA HEAD WITHOUT CONTRAST TECHNIQUE: Multiplanar, multiecho pulse sequences of the brain and surrounding structures were obtained without intravenous contrast. Angiographic images of the head were obtained using MRA technique without contrast. COMPARISON:  04/14/2018 CT head FINDINGS: MRI HEAD FINDINGS Brain: Focus of reduced diffusion compatible with acute/early subacute infarction spanning approximately 2 cm within left body of corpus callosum and cingulate gyrus (series 3, image 29 and series 650, image 14). No associated hemorrhage or mass effect. Several nonspecific T2 FLAIR hyperintensities in predominantly periventricular white matter are compatible with mild to moderate chronic microvascular ischemic changes for age. Mild volume loss of the brain. Small chronic infarcts are present within the left cerebellar hemisphere and the bilateral thalami. No extra-axial collection, hydrocephalus, mass effect, or herniation. Vascular: Normal flow voids. Skull and upper cervical spine: Normal marrow signal. Sinuses/Orbits: Negative. Other: Bilateral intra-ocular lens replacement. MRA HEAD FINDINGS Internal carotid arteries:  Patent. Anterior cerebral arteries:  Patent. Middle cerebral arteries: Patent. Anterior communicating artery: Patent. Posterior communicating arteries: Patent right. No left identified, likely hypoplastic or absent. Posterior cerebral arteries:  Patent. Basilar artery:  Patent. Vertebral arteries:  Patent. No evidence of high-grade stenosis, large vessel occlusion, or aneurysm unless noted above. IMPRESSION: MRI head: 1. Small acute/early subacute infarction involving left body of corpus callosum and  cingulate gyrus. No associated hemorrhage or mass effect. 2. Mild-to-moderate chronic microvascular ischemic changes and mild volume loss of the brain. MRA head: Patent anterior and posterior intracranial circulation. No large vessel occlusion, aneurysm, or significant stenosis is identified. These results will be called to the ordering clinician or representative by the Radiologist Assistant, and communication documented in the PACS or zVision Dashboard. Electronically Signed   By: Mitzi Hansen M.D.   On: 04/14/2018 14:04    EKG: Independently reviewed.  NSR with rate 80; nonspecific ST changes with no evidence of acute ischemia   Labs on Admission: I have personally reviewed the available labs and imaging studies at the time of the admission.  Pertinent labs:   CO2 21, CMP otherwise WNL Troponin 0.01 WBC 4.6 Hgb 5.5 MCV 64.5 RDW 20.5 INR 1.16 ETOH negative Heme negative  Assessment/Plan Principal Problem:   CVA (cerebral vascular accident) (HCC) Active Problems:   Symptomatic anemia   Essential hypertension   Dyslipidemia   Dementia (HCC)   Acquired hypothyroidism   CVA -Patient with underlying dementia presenting with inability to bear weight on her right leg which was transient in nature -Patient  is a poor historian and was very unhappy to be in the ER and so it was difficult to narrow down history and physical -CT was negative for CVA -However, MRI shows small acute/early subacute infarct L corpus callosum and cingulate gyrus.   -Negative MRA. -Will place in observation status for further CVA evaluation for now -Telemetry monitoring -Carotid dopplers -Echo -Risk stratification with FLP -ASA daily -Neurology consult -PT/OT/ST/Nutrition Consults  HTN -Allow permissive HTN for now -Treat BP only if >220/120, and then with goal of 15% reduction -Hold Nrorvasc and plan to restart in 48-72 hours   HLD -Check FLP -Resume statin but will change Zocor to  Lipitor 40 mg daily   Symptomatic anemia -Patient with extremely poor dietary habits presenting with severe anemia -MCV is <80 and so this is microcytic anemia -MCV is < 70 indicating probable iron deficiency -With an MCV <70, elevated RDW, and reason for iron deficiency - iron deficiency can be presumed -Anemia panel has been added -Will request nutrition consult - it appears that the patient eats snack foods and honey nut cheerios as her mainstay  -Heme testing was negative. -Transfuse 2 units PRBC to start and recheck Hgb afterwards.  -Patient counseled about short- and long-term risks associated with transfusion and consents to receive blood products.  Dementia  -Her mood and memory may be exacerbated by her CVA -However, the patient was quite cantankerous and dressed herself to go home when no one was in the room -She does not remember why she is hospitalized -She may be behaviorally challenging while hospitalized  Hypothyroidism -Check TSH and free T4 -Continue Synthroid at current dose for now    DVT prophylaxis:  SCDs Code Status: DNR - confirmed with patient/family Family Communication: Daughter and granddaughter present throughout evaluation Disposition Plan:  Home once clinically improved Consults called: Neurology; PT/OT/ST/SW/Nutrition  Admission status: It is my clinical opinion that referral for OBSERVATION is reasonable and necessary in this patient based on the above information provided. The aforementioned taken together are felt to place the patient at high risk for further clinical deterioration. However it is anticipated that the patient may be medically stable for discharge from the hospital within 24 to 48 hours.     Jonah BlueJennifer Ladarryl Wrage MD [facility]  If note is complete, please contact covering daytime or nighttime physician. [email or web address] Password [email or web address]  04/14/2018, 5:09 PM

## 2018-04-14 NOTE — ED Triage Notes (Signed)
Pts daughter states pt went outside to let the dogs out and came inside. Daughter heard a noise and pt was in seated position on the floor. Pt states her left leg felt numb. Denies pain or LOC.

## 2018-04-15 ENCOUNTER — Other Ambulatory Visit: Payer: Self-pay

## 2018-04-15 ENCOUNTER — Encounter (HOSPITAL_COMMUNITY): Payer: Self-pay | Admitting: General Practice

## 2018-04-15 ENCOUNTER — Observation Stay (HOSPITAL_COMMUNITY): Payer: Medicare Other

## 2018-04-15 ENCOUNTER — Observation Stay (HOSPITAL_BASED_OUTPATIENT_CLINIC_OR_DEPARTMENT_OTHER): Payer: Medicare Other

## 2018-04-15 DIAGNOSIS — I63422 Cerebral infarction due to embolism of left anterior cerebral artery: Principal | ICD-10-CM

## 2018-04-15 DIAGNOSIS — I6389 Other cerebral infarction: Secondary | ICD-10-CM

## 2018-04-15 DIAGNOSIS — I10 Essential (primary) hypertension: Secondary | ICD-10-CM | POA: Diagnosis not present

## 2018-04-15 DIAGNOSIS — D649 Anemia, unspecified: Secondary | ICD-10-CM | POA: Diagnosis not present

## 2018-04-15 DIAGNOSIS — E039 Hypothyroidism, unspecified: Secondary | ICD-10-CM | POA: Diagnosis not present

## 2018-04-15 LAB — CBC
HCT: 28 % — ABNORMAL LOW (ref 36.0–46.0)
HEMOGLOBIN: 7.7 g/dL — AB (ref 12.0–15.0)
MCH: 18.6 pg — ABNORMAL LOW (ref 26.0–34.0)
MCHC: 27.5 g/dL — ABNORMAL LOW (ref 30.0–36.0)
MCV: 67.6 fL — ABNORMAL LOW (ref 80.0–100.0)
Platelets: 361 10*3/uL (ref 150–400)
RBC: 4.14 MIL/uL (ref 3.87–5.11)
RDW: 25.9 % — ABNORMAL HIGH (ref 11.5–15.5)
WBC: 4.3 10*3/uL (ref 4.0–10.5)
nRBC: 0 % (ref 0.0–0.2)

## 2018-04-15 LAB — HEMOGLOBIN A1C
HEMOGLOBIN A1C: 5.1 % (ref 4.8–5.6)
Mean Plasma Glucose: 99.67 mg/dL

## 2018-04-15 LAB — TSH: TSH: 1.145 u[IU]/mL (ref 0.350–4.500)

## 2018-04-15 LAB — LIPID PANEL
CHOL/HDL RATIO: 2.9 ratio
Cholesterol: 89 mg/dL (ref 0–200)
HDL: 31 mg/dL — ABNORMAL LOW (ref >40)
LDL Cholesterol: 39 mg/dL (ref 0–99)
Triglycerides: 97 mg/dL (ref <150)
VLDL: 19 mg/dL (ref 0–40)

## 2018-04-15 LAB — ECHOCARDIOGRAM COMPLETE
Height: 62 in
Weight: 2480 oz

## 2018-04-15 LAB — T4, FREE: FREE T4: 0.87 ng/dL (ref 0.82–1.77)

## 2018-04-15 MED ORDER — ASPIRIN EC 81 MG PO TBEC
81.0000 mg | DELAYED_RELEASE_TABLET | Freq: Every day | ORAL | Status: DC
  Administered 2018-04-15 – 2018-04-16 (×2): 81 mg via ORAL
  Filled 2018-04-15 (×2): qty 1

## 2018-04-15 MED ORDER — CYANOCOBALAMIN 1000 MCG/ML IJ SOLN
1000.0000 ug | Freq: Once | INTRAMUSCULAR | Status: AC
  Administered 2018-04-15: 1000 ug via INTRAMUSCULAR
  Filled 2018-04-15: qty 1

## 2018-04-15 MED ORDER — VITAMIN B-12 1000 MCG PO TABS
1000.0000 ug | ORAL_TABLET | Freq: Every day | ORAL | Status: DC
  Administered 2018-04-16: 1000 ug via ORAL
  Filled 2018-04-15: qty 1

## 2018-04-15 MED ORDER — IOPAMIDOL (ISOVUE-370) INJECTION 76%
INTRAVENOUS | Status: AC
  Administered 2018-04-15: 50 mL
  Filled 2018-04-15: qty 100

## 2018-04-15 NOTE — Evaluation (Signed)
Physical Therapy Evaluation Patient Details Name: Kristen Parks MRN: [id number] DOB: [birth year] Today's Date: 04/15/2018   History of Present Illness  78 y.o. female with no significant past medical history presenting with stroke-like symptoms.  She has dementia and can't provide history.  She came in from walking her dog and her granddaughter heard a thunk.  She fell to the floor in a sitting position.  She complained that her left leg and arm felt like they didn't belong to her. MRI showed small acute infarct L corpus collosum and cingulate gyrus.   Clinical Impression  Pt ambulated 300' without an assistive device, no loss of balance with challenges. No deficits noted with LUE/LE strength/sensation/coordination.  Symptoms appear to have resolved. From PT standpoint, pt is ready to DC home with no further PT indicated as pt is mobilizing independently. Pt is eager to DC home and to walk her 2 dogs. PT will sign off.     Follow Up Recommendations No PT follow up    Equipment Recommendations  None recommended by PT    Recommendations for Other Services       Precautions / Restrictions Precautions Precautions: Fall Precaution Comments: pt denies h/o falls in past year (son confirms) Restrictions Weight Bearing Restrictions: No      Mobility  Bed Mobility Overal bed mobility: Independent                Transfers Overall transfer level: Independent                  Ambulation/Gait Ambulation/Gait assistance: Independent Gait Distance (Feet): 300 Feet Assistive device: None Gait Pattern/deviations: WFL(Within Functional Limits) Gait velocity: WNL   General Gait Details: no loss of balance with head turns while walking, able to perform quick stop without loss of balance  Stairs            Wheelchair Mobility    Modified Rankin (Stroke Patients Only)       Balance Overall balance assessment: Independent                                            Pertinent Vitals/Pain Pain Assessment: No/denies pain    Home Living Family/patient expects to be discharged to:: Private residence Living Arrangements: Children Available Help at Discharge: Family;Available 24 hours/day Type of Home: House Home Access: Stairs to enter Entrance Stairs-Rails: Right Entrance Stairs-Number of Steps: 5 Home Layout: Two level Home Equipment: None      Prior Function Level of Independence: Independent         Comments: doesn't drive, independent walking and ADLs, no falls in past 1 year     Hand Dominance   Dominant Hand: Right    Extremity/Trunk Assessment   Upper Extremity Assessment Upper Extremity Assessment: Overall WFL for tasks assessed    Lower Extremity Assessment Lower Extremity Assessment: Overall WFL for tasks assessed    Cervical / Trunk Assessment Cervical / Trunk Assessment: Normal  Communication   Communication: No difficulties  Cognition Arousal/Alertness: Awake/alert Behavior During Therapy: WFL for tasks assessed/performed Overall Cognitive Status: History of cognitive impairments - at baseline                                 General Comments: oriented to year, location, self; not oriented to situation ("I'm in the  hospital but don't know why".)      General Comments      Exercises     Assessment/Plan    PT Assessment Patent does not need any further PT services  PT Problem List         PT Treatment Interventions      PT Goals (Current goals can be found in the Care Plan section)  Acute Rehab PT Goals Patient Stated Goal: walk the dogs PT Goal Formulation: All assessment and education complete, DC therapy    Frequency     Barriers to discharge        Co-evaluation               AM-PAC PT "6 Clicks" Mobility  Outcome Measure Help needed turning from your back to your side while in a flat bed without using bedrails?: None Help needed moving from lying on  your back to sitting on the side of a flat bed without using bedrails?: None Help needed moving to and from a bed to a chair (including a wheelchair)?: None Help needed standing up from a chair using your arms (e.g., wheelchair or bedside chair)?: None Help needed to walk in hospital room?: None Help needed climbing 3-5 steps with a railing? : None 6 Click Score: 24    End of Session Equipment Utilized During Treatment: Gait belt Activity Tolerance: Patient tolerated treatment well Patient left: in chair;with call bell/phone within reach;with family/visitor present Nurse Communication: Mobility status      Time: 0920-0949 PT Time Calculation (min) (ACUTE ONLY): 29 min   Charges:   PT Evaluation $PT Eval Low Complexity: 1 Low PT Treatments $Gait Training: 8-22 mins        Ralene BatheUhlenberg, Idaliz Tinkle Kistler PT 04/15/2018  Acute Rehabilitation Services Pager [phone] Office [phone]

## 2018-04-15 NOTE — Progress Notes (Signed)
  Echocardiogram 2D Echocardiogram has been performed.  Kristen Parks, Kristen Parks F 04/15/2018, 10:40 AM

## 2018-04-15 NOTE — Social Work (Signed)
Noting that pt is ambulating 300' independently. No further PT recommendations. Per notes pt lives at home with family and pets.  CSW signing off. Please consult if any additional needs arise.  Doy Hutching, LCSWA [facility] Clinical Social Work [phone]

## 2018-04-15 NOTE — Progress Notes (Signed)
PROGRESS NOTE                                                                                                                                                                                                             Patient Demographics:    Kristen Parks, is a 78 y.o. female, DOB - 1940/05/20, JSE:[id number]  Admit date - 04/14/2018   Admitting Physician Jonah Blue, MD  Outpatient Primary MD for the patient is Jarome Matin, MD  LOS - 0   Chief Complaint  Patient presents with  . stroke like symptoms       Brief Narrative    78 y.o. female with history of hypertension, dyslipidemia, dementia, anemia and hypothyroidism.  At approximately 7:30 in the morning her daughters noted they heard a thud and found patient on the floor with her right leg up underneath her.  Reports right lower extremity weakness, work-up significant for anemia of 5.3, and significant for acute CVA.   Subjective:    [name] today has, No headache, No chest pain, No abdominal pain - No Nausea, No new weakness tingling or numbness, No Cough - SOB.    Assessment  & Plan :    Principal Problem:   CVA (cerebral vascular accident) (HCC) Active Problems:   Symptomatic anemia   Essential hypertension   Dyslipidemia   Dementia (HCC)   Acquired hypothyroidism   Acute ischemic CVA -Patient with underlying dementia presenting with inability to bear weight on her right leg which was transient in nature -CT head was negative for acute CVA, but her MRI was significant for small acute/early subacute infarct in the left corpus callosum and cingulate gyrus. -Negative MRA -On 2D echo and carotid Dopplers -Further recommendation per neurology -Most likely anemia with hemoglobin of 5.3 contributing to her hypoperfusion status causing acute CVA -PT/OT/ST/Nutrition Consults  HTN -allow For permissive hypertension, continue to hold Norvasc  HLD -Check FLP -Resume  statin but will change Zocor to Lipitor 40 mg daily  Symptomatic anemia -Iron deficiency anemia, as well low B12, as iron level of 10, ferritin of 3, and B12 of 208, she is Hemoccult negative, no evidence of GI bleed. -50 units PRBC, hemoglobin improved from 5.3-7.7, given she presents with acute CVA, I will transfuse another unit PRBC today. -She will need IV iron before discharge, she will need  to be discharged on iron supplements. -Continue with B12 supplements, continue with IV supplements during hospital stay, in addition to oral on discharge  Dementia -Her mood and memory may be exacerbated by her CVA -However, the patient was quite cantankerous and dressed herself to go home when no one was in the room -She does not remember why she is hospitalized -She may be behaviorally challenging while hospitalized  Hypothyroidism -Sh and free T4 within normal limits -Continue Synthroid at current dose for now    Code Status : Full  Family Communication  : Son at bedside  Disposition Plan  : Home when stable  Barriers For Discharge : Acute CVA diagnosis, further work-up still pending, receiving another unit PRBC today, will need IV iron tomorrow  Consults  : Neurology  Procedures  : None  DVT Prophylaxis  :  SCD  Lab Results  Component Value Date   PLT 361 04/15/2018    Antibiotics  :    Anti-infectives (From admission, onward)   None        Objective:   Vitals:   04/15/18 0016 04/15/18 0549 04/15/18 0800 04/15/18 1227  BP: (!) 98/42 (!) 116/57 122/70 115/64  Pulse: 76 73 74 74  Resp: 18 18    Temp: 99.1 F (37.3 C) 98.1 F (36.7 C)  98.7 F (37.1 C)  TempSrc: Oral   Oral  SpO2: 97% 96% 96% 100%  Weight:      Height:        Wt Readings from Last 3 Encounters:  04/14/18 70.3 kg  11/22/16 68 kg  01/28/12 61.7 kg     Intake/Output Summary (Last 24 hours) at 04/15/2018 1338 Last data filed at 04/14/2018 2343 Gross per 24 hour  Intake 435 ml  Output  -  Net 435 ml     Physical Exam  Awake Alert, is not, demented  Symmetrical Chest wall movement, Good air movement bilaterally, CTAB RRR,No Gallops,Rubs or new Murmurs, No Parasternal Heave +ve B.Sounds, Abd Soft, No tenderness, , No rebound - guarding or rigidity. No Cyanosis, Clubbing or edema, No new Rash or bruise      Data Review:    CBC Recent Labs  Lab 04/14/18 0935 04/14/18 0947 04/14/18 1553 04/15/18 0725  WBC 4.6  --  5.4 4.3  HGB 5.5* 7.5* 5.3* 7.7*  HCT 22.9* 22.0* 21.8* 28.0*  PLT 433*  --  403* 361  MCV 64.5*  --  62.6* 67.6*  MCH 15.5*  --  15.2* 18.6*  MCHC 24.0*  --  24.3* 27.5*  RDW 20.5*  --  20.0* 25.9*  LYMPHSABS 1.0  --   --   --   MONOABS 0.6  --   --   --   EOSABS 0.1  --   --   --   BASOSABS 0.1  --   --   --     Chemistries  Recent Labs  Lab 04/14/18 0935 04/14/18 0947  NA 140 142  K 3.7 3.7  CL 110 110  CO2 21*  --   GLUCOSE 99 96  BUN 14 15  CREATININE 0.75 0.70  CALCIUM 9.1  --   AST 18  --   ALT 11  --   ALKPHOS 90  --   BILITOT 0.4  --    ------------------------------------------------------------------------------------------------------------------ Recent Labs    04/15/18 0339  CHOL 89  HDL 31*  LDLCALC 39  TRIG 97  CHOLHDL 2.9    Lab Results  Component Value  Date   HGBA1C 5.1 04/15/2018   ------------------------------------------------------------------------------------------------------------------ Recent Labs    04/15/18 0339  TSH 1.145   ------------------------------------------------------------------------------------------------------------------ Recent Labs    04/14/18 1041  VITAMINB12 208  FOLATE 43.0  FERRITIN 3*  TIBC 420  IRON 10*  RETICCTPCT 1.5    Coagulation profile Recent Labs  Lab 04/14/18 0935  INR 1.16    No results for input(s): DDIMER in the last 72 hours.  Cardiac Enzymes No results for input(s): CKMB, TROPONINI, MYOGLOBIN in the last 168 hours.  Invalid  input(s): CK ------------------------------------------------------------------------------------------------------------------ No results found for: BNP  Inpatient Medications  Scheduled Meds: . aspirin EC  81 mg Oral Daily  . gabapentin  300 mg Oral QHS  . levothyroxine  112 mcg Oral QAC breakfast  . pantoprazole  40 mg Oral Daily  . [START ON 04/16/2018] vitamin B-12  1,000 mcg Oral Daily   Continuous Infusions: . sodium chloride    . sodium chloride 50 mL/hr at 04/15/18 0101   PRN Meds:.acetaminophen **OR** acetaminophen (TYLENOL) oral liquid 160 mg/5 mL **OR** acetaminophen, hydrOXYzine, senna-docusate  Micro Results No results found for this or any previous visit (from the past 240 hour(s)).  Radiology Reports Ct Head Wo Contrast  Result Date: 04/14/2018 CLINICAL DATA:  Larey Seat.  Lower extremity numbness. EXAM: CT HEAD WITHOUT CONTRAST CT CERVICAL SPINE WITHOUT CONTRAST TECHNIQUE: Multidetector CT imaging of the head and cervical spine was performed following the standard protocol without intravenous contrast. Multiplanar CT image reconstructions of the cervical spine were also generated. COMPARISON:  None. FINDINGS: CT HEAD FINDINGS Brain: Age related cerebral atrophy, ventriculomegaly and periventricular white matter disease. No extra-axial fluid collections are identified. No CT findings for acute hemispheric infarction or intracranial hemorrhage. No mass lesions. The brainstem and cerebellum are normal. Vascular: Minimal vascular calcifications. No obvious aneurysm or hyperdense vessels. Skull: No skull fracture or bone lesion. Mild hyperostosis frontalis interna. Sinuses/Orbits: The paranasal sinuses and mastoid air cells are clear. The globes are intact. Other: No scalp lesions or hematoma. CT CERVICAL SPINE FINDINGS Alignment: Normal overall alignment. Mild degenerative cervical spondylosis with multilevel disc disease and facet disease. Skull base and vertebrae: The skull base C1  and C1-2 articulations are maintained. Moderate degenerative changes at C1-2. No dens fracture. No vertebral body, facet or laminar fractures. Soft tissues and spinal canal: No prevertebral fluid or swelling. No visible canal hematoma. Disc levels: Generous spinal canal without findings for spinal or foraminal stenosis. No large disc protrusions. Upper chest: The lung apices are grossly clear. Other: No neck mass or lymphadenopathy. IMPRESSION: 1. Age related cerebral atrophy, ventriculomegaly and periventricular white matter disease. 2. No acute intracranial findings or skull fracture. 3. Normal alignment of the cervical vertebral bodies and no acute cervical spine fracture. Generous spinal without obvious large disc protrusion or canal compromise. Electronically Signed   By: Rudie Meyer M.D.   On: 04/14/2018 10:00   Ct Angio Neck W Or Wo Contrast  Result Date: 04/15/2018 CLINICAL DATA:  Stroke follow-up. EXAM: CT ANGIOGRAPHY NECK TECHNIQUE: Multidetector CT imaging of the neck was performed using the standard protocol during bolus administration of intravenous contrast. Multiplanar CT image reconstructions and MIPs were obtained to evaluate the vascular anatomy. Carotid stenosis measurements (when applicable) are obtained utilizing NASCET criteria, using the distal internal carotid diameter as the denominator. CONTRAST:  18mL ISOVUE-370 IOPAMIDOL (ISOVUE-370) INJECTION 76% COMPARISON:  None. FINDINGS: Aortic arch: Standard 3 vessel aortic arch with mild atherosclerotic plaque. Widely patent arch vessel origins. Right carotid system: Patent  without evidence of stenosis or dissection. Beaded appearance of the mid cervical ICA not felt to be Sully due to mild motion artifact in this region. Left carotid system: Patent without evidence of stenosis or dissection. Possible mild beading of the mid cervical ICA. Vertebral arteries: Patent without evidence of flow limiting stenosis or dissection. Mild right V3 and V4  stenoses. Slight beading of both V3 segments. Skeleton: Mild cervical spondylosis. Other neck: Diminutive thyroid. 1.3 x 0.8 x 2.0 cm avidly enhancing mass posterior to the expected region of the left thyroid lobe separate from the proximal esophagus. Upper chest: Clear lung apices. IMPRESSION: 1. No large vessel occlusion or significant carotid artery stenosis. 2. Mild distal right vertebral artery stenoses. 3. 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 4. 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 5.  Aortic Atherosclerosis (ICD10-I70.0). Electronically Signed   By: Sebastian AcheAllen  Grady M.D.   On: 04/15/2018 12:18   Ct Cervical Spine Wo Contrast  Result Date: 04/14/2018 CLINICAL DATA:  Larey SeatFell.  Lower extremity numbness. EXAM: CT HEAD WITHOUT CONTRAST CT CERVICAL SPINE WITHOUT CONTRAST TECHNIQUE: Multidetector CT imaging of the head and cervical spine was performed following the standard protocol without intravenous contrast. Multiplanar CT image reconstructions of the cervical spine were also generated. COMPARISON:  None. FINDINGS: CT HEAD FINDINGS Brain: Age related cerebral atrophy, ventriculomegaly and periventricular white matter disease. No extra-axial fluid collections are identified. No CT findings for acute hemispheric infarction or intracranial hemorrhage. No mass lesions. The brainstem and cerebellum are normal. Vascular: Minimal vascular calcifications. No obvious aneurysm or hyperdense vessels. Skull: No skull fracture or bone lesion. Mild hyperostosis frontalis interna. Sinuses/Orbits: The paranasal sinuses and mastoid air cells are clear. The globes are intact. Other: No scalp lesions or hematoma. CT CERVICAL SPINE FINDINGS Alignment: Normal overall alignment. Mild degenerative cervical spondylosis with multilevel disc disease and facet disease. Skull base and vertebrae: The skull base C1 and C1-2 articulations are  maintained. Moderate degenerative changes at C1-2. No dens fracture. No vertebral body, facet or laminar fractures. Soft tissues and spinal canal: No prevertebral fluid or swelling. No visible canal hematoma. Disc levels: Generous spinal canal without findings for spinal or foraminal stenosis. No large disc protrusions. Upper chest: The lung apices are grossly clear. Other: No neck mass or lymphadenopathy. IMPRESSION: 1. Age related cerebral atrophy, ventriculomegaly and periventricular white matter disease. 2. No acute intracranial findings or skull fracture. 3. Normal alignment of the cervical vertebral bodies and no acute cervical spine fracture. Generous spinal without obvious large disc protrusion or canal compromise. Electronically Signed   By: Rudie MeyerP.  Gallerani M.D.   On: 04/14/2018 10:00   Mr Brain Wo Contrast  Result Date: 04/14/2018 CLINICAL DATA:  78 y/o F; left-sided weakness and numbness with fall. EXAM: MRI HEAD WITHOUT CONTRAST MRA HEAD WITHOUT CONTRAST TECHNIQUE: Multiplanar, multiecho pulse sequences of the brain and surrounding structures were obtained without intravenous contrast. Angiographic images of the head were obtained using MRA technique without contrast. COMPARISON:  04/14/2018 CT head FINDINGS: MRI HEAD FINDINGS Brain: Focus of reduced diffusion compatible with acute/early subacute infarction spanning approximately 2 cm within left body of corpus callosum and cingulate gyrus (series 3, image 29 and series 650, image 14). No associated hemorrhage or mass effect. Several nonspecific T2 FLAIR hyperintensities in predominantly periventricular white matter are compatible with mild to moderate chronic microvascular ischemic changes for age. Mild volume loss of the brain. Small chronic infarcts are present within  the left cerebellar hemisphere and the bilateral thalami. No extra-axial collection, hydrocephalus, mass effect, or herniation. Vascular: Normal flow voids. Skull and upper cervical  spine: Normal marrow signal. Sinuses/Orbits: Negative. Other: Bilateral intra-ocular lens replacement. MRA HEAD FINDINGS Internal carotid arteries:  Patent. Anterior cerebral arteries:  Patent. Middle cerebral arteries: Patent. Anterior communicating artery: Patent. Posterior communicating arteries: Patent right. No left identified, likely hypoplastic or absent. Posterior cerebral arteries:  Patent. Basilar artery:  Patent. Vertebral arteries:  Patent. No evidence of high-grade stenosis, large vessel occlusion, or aneurysm unless noted above. IMPRESSION: MRI head: 1. Small acute/early subacute infarction involving left body of corpus callosum and cingulate gyrus. No associated hemorrhage or mass effect. 2. Mild-to-moderate chronic microvascular ischemic changes and mild volume loss of the brain. MRA head: Patent anterior and posterior intracranial circulation. No large vessel occlusion, aneurysm, or significant stenosis is identified. These results will be called to the ordering clinician or representative by the Radiologist Assistant, and communication documented in the PACS or zVision Dashboard. Electronically Signed   By: Mitzi Hansen M.D.   On: 04/14/2018 14:04   Mr Maxine Glenn Head Wo Contrast  Result Date: 04/14/2018 CLINICAL DATA:  78 y/o F; left-sided weakness and numbness with fall. EXAM: MRI HEAD WITHOUT CONTRAST MRA HEAD WITHOUT CONTRAST TECHNIQUE: Multiplanar, multiecho pulse sequences of the brain and surrounding structures were obtained without intravenous contrast. Angiographic images of the head were obtained using MRA technique without contrast. COMPARISON:  04/14/2018 CT head FINDINGS: MRI HEAD FINDINGS Brain: Focus of reduced diffusion compatible with acute/early subacute infarction spanning approximately 2 cm within left body of corpus callosum and cingulate gyrus (series 3, image 29 and series 650, image 14). No associated hemorrhage or mass effect. Several nonspecific T2 FLAIR  hyperintensities in predominantly periventricular white matter are compatible with mild to moderate chronic microvascular ischemic changes for age. Mild volume loss of the brain. Small chronic infarcts are present within the left cerebellar hemisphere and the bilateral thalami. No extra-axial collection, hydrocephalus, mass effect, or herniation. Vascular: Normal flow voids. Skull and upper cervical spine: Normal marrow signal. Sinuses/Orbits: Negative. Other: Bilateral intra-ocular lens replacement. MRA HEAD FINDINGS Internal carotid arteries:  Patent. Anterior cerebral arteries:  Patent. Middle cerebral arteries: Patent. Anterior communicating artery: Patent. Posterior communicating arteries: Patent right. No left identified, likely hypoplastic or absent. Posterior cerebral arteries:  Patent. Basilar artery:  Patent. Vertebral arteries:  Patent. No evidence of high-grade stenosis, large vessel occlusion, or aneurysm unless noted above. IMPRESSION: MRI head: 1. Small acute/early subacute infarction involving left body of corpus callosum and cingulate gyrus. No associated hemorrhage or mass effect. 2. Mild-to-moderate chronic microvascular ischemic changes and mild volume loss of the brain. MRA head: Patent anterior and posterior intracranial circulation. No large vessel occlusion, aneurysm, or significant stenosis is identified. These results will be called to the ordering clinician or representative by the Radiologist Assistant, and communication documented in the PACS or zVision Dashboard. Electronically Signed   By: Mitzi Hansen M.D.   On: 04/14/2018 14:04      Huey Bienenstock M.D on 04/15/2018 at 1:38 PM  Between 7am to 7pm - Pager - [phone]  After 7pm go to [email or web address] - password [facility]  [facility] -  Office  [phone]

## 2018-04-15 NOTE — Progress Notes (Addendum)
STROKE TEAM PROGRESS NOTE   INTERVAL HISTORY Her 2 daughters are at the bedside.  She is sitting up on the bedside.  She really does look well.  She states she feels better than she did yesterday.  Stroke symptoms virtually nonexistent.  Vitals:   04/14/18 2338 04/15/18 0016 04/15/18 0549 04/15/18 0800  BP: (!) 115/58 (!) 98/42 (!) 116/57 122/70  Pulse: 78 76 73 74  Resp: 18 18 18    Temp: 99.3 F (37.4 C) 99.1 F (37.3 C) 98.1 F (36.7 C)   TempSrc: Oral Oral    SpO2: 98% 97% 96% 96%  Weight:      Height:        CBC:  Recent Labs  Lab 04/14/18 0935  04/14/18 1553 04/15/18 0725  WBC 4.6  --  5.4 4.3  NEUTROABS 2.8  --   --   --   HGB 5.5*   < > 5.3* 7.7*  HCT 22.9*   < > 21.8* 28.0*  MCV 64.5*  --  62.6* 67.6*  PLT 433*  --  403* 361   < > = values in this interval not displayed.    Basic Metabolic Panel:  Recent Labs  Lab 04/14/18 0935 04/14/18 0947  NA 140 142  K 3.7 3.7  CL 110 110  CO2 21*  --   GLUCOSE 99 96  BUN 14 15  CREATININE 0.75 0.70  CALCIUM 9.1  --    Lipid Panel:     Component Value Date/Time   CHOL 89 04/15/2018 0339   TRIG 97 04/15/2018 0339   HDL 31 (L) 04/15/2018 0339   CHOLHDL 2.9 04/15/2018 0339   VLDL 19 04/15/2018 0339   LDLCALC 39 04/15/2018 0339   HgbA1c:  Lab Results  Component Value Date   HGBA1C 5.1 04/15/2018   Urine Drug Screen: No results found for: LABOPIA, COCAINSCRNUR, LABBENZ, AMPHETMU, THCU, LABBARB  Alcohol Level     Component Value Date/Time   ETH <10 04/14/2018 0935    IMAGING Ct Head Wo Contrast  Result Date: 04/14/2018 CLINICAL DATA:  Kristen SeatFell.  Lower extremity numbness. EXAM: CT HEAD WITHOUT CONTRAST CT CERVICAL SPINE WITHOUT CONTRAST TECHNIQUE: Multidetector CT imaging of the head and cervical spine was performed following the standard protocol without intravenous contrast. Multiplanar CT image reconstructions of the cervical spine were also generated. COMPARISON:  None. FINDINGS: CT HEAD FINDINGS Brain:  Age related cerebral atrophy, ventriculomegaly and periventricular white matter disease. No extra-axial fluid collections are identified. No CT findings for acute hemispheric infarction or intracranial hemorrhage. No mass lesions. The brainstem and cerebellum are normal. Vascular: Minimal vascular calcifications. No obvious aneurysm or hyperdense vessels. Skull: No skull fracture or bone lesion. Mild hyperostosis frontalis interna. Sinuses/Orbits: The paranasal sinuses and mastoid air cells are clear. The globes are intact. Other: No scalp lesions or hematoma. CT CERVICAL SPINE FINDINGS Alignment: Normal overall alignment. Mild degenerative cervical spondylosis with multilevel disc disease and facet disease. Skull base and vertebrae: The skull base C1 and C1-2 articulations are maintained. Moderate degenerative changes at C1-2. No dens fracture. No vertebral body, facet or laminar fractures. Soft tissues and spinal canal: No prevertebral fluid or swelling. No visible canal hematoma. Disc levels: Generous spinal canal without findings for spinal or foraminal stenosis. No large disc protrusions. Upper chest: The lung apices are grossly clear. Other: No neck mass or lymphadenopathy. IMPRESSION: 1. Age related cerebral atrophy, ventriculomegaly and periventricular white matter disease. 2. No acute intracranial findings or skull fracture. 3. Normal  alignment of the cervical vertebral bodies and no acute cervical spine fracture. Generous spinal without obvious large disc protrusion or canal compromise. Electronically Signed   By: Rudie Meyer M.D.   On: 04/14/2018 10:00   Ct Cervical Spine Wo Contrast  Result Date: 04/14/2018 CLINICAL DATA:  Kristen Parks.  Lower extremity numbness. EXAM: CT HEAD WITHOUT CONTRAST CT CERVICAL SPINE WITHOUT CONTRAST TECHNIQUE: Multidetector CT imaging of the head and cervical spine was performed following the standard protocol without intravenous contrast. Multiplanar CT image reconstructions  of the cervical spine were also generated. COMPARISON:  None. FINDINGS: CT HEAD FINDINGS Brain: Age related cerebral atrophy, ventriculomegaly and periventricular white matter disease. No extra-axial fluid collections are identified. No CT findings for acute hemispheric infarction or intracranial hemorrhage. No mass lesions. The brainstem and cerebellum are normal. Vascular: Minimal vascular calcifications. No obvious aneurysm or hyperdense vessels. Skull: No skull fracture or bone lesion. Mild hyperostosis frontalis interna. Sinuses/Orbits: The paranasal sinuses and mastoid air cells are clear. The globes are intact. Other: No scalp lesions or hematoma. CT CERVICAL SPINE FINDINGS Alignment: Normal overall alignment. Mild degenerative cervical spondylosis with multilevel disc disease and facet disease. Skull base and vertebrae: The skull base C1 and C1-2 articulations are maintained. Moderate degenerative changes at C1-2. No dens fracture. No vertebral body, facet or laminar fractures. Soft tissues and spinal canal: No prevertebral fluid or swelling. No visible canal hematoma. Disc levels: Generous spinal canal without findings for spinal or foraminal stenosis. No large disc protrusions. Upper chest: The lung apices are grossly clear. Other: No neck mass or lymphadenopathy. IMPRESSION: 1. Age related cerebral atrophy, ventriculomegaly and periventricular white matter disease. 2. No acute intracranial findings or skull fracture. 3. Normal alignment of the cervical vertebral bodies and no acute cervical spine fracture. Generous spinal without obvious large disc protrusion or canal compromise. Electronically Signed   By: Rudie Meyer M.D.   On: 04/14/2018 10:00   Mr Brain Wo Contrast  Result Date: 04/14/2018 CLINICAL DATA:  78 y/o F; left-sided weakness and numbness with fall. EXAM: MRI HEAD WITHOUT CONTRAST MRA HEAD WITHOUT CONTRAST TECHNIQUE: Multiplanar, multiecho pulse sequences of the brain and surrounding  structures were obtained without intravenous contrast. Angiographic images of the head were obtained using MRA technique without contrast. COMPARISON:  04/14/2018 CT head FINDINGS: MRI HEAD FINDINGS Brain: Focus of reduced diffusion compatible with acute/early subacute infarction spanning approximately 2 cm within left body of corpus callosum and cingulate gyrus (series 3, image 29 and series 650, image 14). No associated hemorrhage or mass effect. Several nonspecific T2 FLAIR hyperintensities in predominantly periventricular white matter are compatible with mild to moderate chronic microvascular ischemic changes for age. Mild volume loss of the brain. Small chronic infarcts are present within the left cerebellar hemisphere and the bilateral thalami. No extra-axial collection, hydrocephalus, mass effect, or herniation. Vascular: Normal flow voids. Skull and upper cervical spine: Normal marrow signal. Sinuses/Orbits: Negative. Other: Bilateral intra-ocular lens replacement. MRA HEAD FINDINGS Internal carotid arteries:  Patent. Anterior cerebral arteries:  Patent. Middle cerebral arteries: Patent. Anterior communicating artery: Patent. Posterior communicating arteries: Patent right. No left identified, likely hypoplastic or absent. Posterior cerebral arteries:  Patent. Basilar artery:  Patent. Vertebral arteries:  Patent. No evidence of high-grade stenosis, large vessel occlusion, or aneurysm unless noted above. IMPRESSION: MRI head: 1. Small acute/early subacute infarction involving left body of corpus callosum and cingulate gyrus. No associated hemorrhage or mass effect. 2. Mild-to-moderate chronic microvascular ischemic changes and mild volume loss of the brain.  MRA head: Patent anterior and posterior intracranial circulation. No large vessel occlusion, aneurysm, or significant stenosis is identified. These results will be called to the ordering clinician or representative by the Radiologist Assistant, and  communication documented in the PACS or zVision Dashboard. Electronically Signed   By: Mitzi Hansen M.D.   On: 04/14/2018 14:04   Mr Maxine Glenn Head Wo Contrast  Result Date: 04/14/2018 CLINICAL DATA:  78 y/o F; left-sided weakness and numbness with fall. EXAM: MRI HEAD WITHOUT CONTRAST MRA HEAD WITHOUT CONTRAST TECHNIQUE: Multiplanar, multiecho pulse sequences of the brain and surrounding structures were obtained without intravenous contrast. Angiographic images of the head were obtained using MRA technique without contrast. COMPARISON:  04/14/2018 CT head FINDINGS: MRI HEAD FINDINGS Brain: Focus of reduced diffusion compatible with acute/early subacute infarction spanning approximately 2 cm within left body of corpus callosum and cingulate gyrus (series 3, image 29 and series 650, image 14). No associated hemorrhage or mass effect. Several nonspecific T2 FLAIR hyperintensities in predominantly periventricular white matter are compatible with mild to moderate chronic microvascular ischemic changes for age. Mild volume loss of the brain. Small chronic infarcts are present within the left cerebellar hemisphere and the bilateral thalami. No extra-axial collection, hydrocephalus, mass effect, or herniation. Vascular: Normal flow voids. Skull and upper cervical spine: Normal marrow signal. Sinuses/Orbits: Negative. Other: Bilateral intra-ocular lens replacement. MRA HEAD FINDINGS Internal carotid arteries:  Patent. Anterior cerebral arteries:  Patent. Middle cerebral arteries: Patent. Anterior communicating artery: Patent. Posterior communicating arteries: Patent right. No left identified, likely hypoplastic or absent. Posterior cerebral arteries:  Patent. Basilar artery:  Patent. Vertebral arteries:  Patent. No evidence of high-grade stenosis, large vessel occlusion, or aneurysm unless noted above. IMPRESSION: MRI head: 1. Small acute/early subacute infarction involving left body of corpus callosum and  cingulate gyrus. No associated hemorrhage or mass effect. 2. Mild-to-moderate chronic microvascular ischemic changes and mild volume loss of the brain. MRA head: Patent anterior and posterior intracranial circulation. No large vessel occlusion, aneurysm, or significant stenosis is identified. These results will be called to the ordering clinician or representative by the Radiologist Assistant, and communication documented in the PACS or zVision Dashboard. Electronically Signed   By: Mitzi Hansen M.D.   On: 04/14/2018 14:04    PHYSICAL EXAM Constitutional: Appears well-developed, happy.Marland Kitchen  Psych: Affect appropriate to situation Eyes: No scleral injection HENT: No OP obstrucion Head: Normocephalic.  Cardiovascular: Normal rate and regular rhythm.  Respiratory: Effort normal, non-labored breathing Skin: WDI  Neuro: Mental Status: Patient is awake, alert, oriented to person, place, month, not year or situation.    She has poor short-term memory and poor recall. Patient is unable to give a clear and coherent history. No signs of aphasia or neglect Cranial Nerves: II: Visual Fields are full.  III,IV, VI: EOMI without ptosis or diploplia. Pupils are equal, round, and reactive to light.   V: Facial sensation is symmetric to temperature VII: Facial movement is symmetric.  VIII: hearing is intact to voice X: Uvula elevates symmetrically XI: Shoulder shrug is symmetric. XII: tongue is midline without atrophy or fasciculations.  Motor: Tone is normal. Bulk is decreased . 5/5 strength was present in all four extremities.  Sensory: Patient stated that she could feel greater sensation on her feet than her upper thighs bilaterally.  Deep Tendon Reflexes: 2+ and symmetric in the biceps and patellae.  Plantars: Toes are downgoing bilaterally. Cerebellar: FNF and HKS are intact bilaterally   ASSESSMENT/PLAN Ms. JENENE KAUFFMANN is a 78  y.o. female with history of hypertension,  dyslipidemia, dementia, anemia and hypothyroidism presenting with R leg weakness, fall.   Stroke:  left ACA infarct embolic secondary to unknown source  CT head age related changes. No acute stroke or fx  CT CS normal alighment, no fx  MRI  sm L corpus callosum and cingulate gyrus infarct. Small vessel disease. Atrophy.   MRA  No LVO  CTA neck no carotid stenosis.  Mild distal R VA stenosis.  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  Aortic atherosclerosis.  2D Echo pending  LDL 39  HgbA1c 5.1  SCDs for VTE prophylaxis  No antithrombotic prior to admission, now on aspirin 81 mg daily. Given anemia, will continue aspirin 81 mg alone at d/c  Therapy recommendations: No PT needs  Disposition:  Return home (family provides 24-hour care).  Patient is anxious to go home.  Hypertension  Stable . Permissive hypertension (OK if < 220/120) but gradually normalize in 5-7 days . Long-term BP goal normotensive  Hyperlipidemia  Home meds:  zocor 20  LDL 39, at goal < 70  Stop statin d/t low LDL. Needs OP lipid f/u.  Other Stroke Risk Factors  Advanced age  Former Cigarette smoker  Overweight. Body mass index is 28.35 kg/m., recommend weight loss, diet and exercise as appropriate   Other Active Problems  Baseline dementia  Symptomatic anemia, transfused  Hypothyroidism  Hospital day # 0  Annie MainSharon Biby, MSN, APRN, ANVP-BC, AGPCNP-BC Advanced Practice Stroke Nurse [facility] See Amion for Schedule & Pager information 04/15/2018 4:32 PM   ATTENDING NOTE: I reviewed above note and agree with the assessment and plan. Pt was seen and examined.   78 year old female with history of dementia, hyperlipidemia, hypertension, anemia, decreased appetite admitted for leg give out and fall at home.  CT negative for acute abnormality.  MRI showed left ACA infarct.  MRI negative.  CT of the neck negative for cerebral vasculature but incidental finding of 2 cm left neck  mass possibly parathyroid in the origin.  EF 66 to 65%.  A1c 5.1 and LDL 79.  B12 208 and creatinine 0.75.  Patient stroke embolic pattern, continue aspirin 81.  Given anemia, do not recommend DAPT at this time.  Continue B12 supplement.  Recommend 30-day CardioNet monitor as outpatient to rule out A. Fib.  On admission, patient had severe anemia hemoglobin 5.5 status post transfusion and hemoglobin improved to 7.5.  Iron and ferritin low, as well as B12 was low, indicating nutritional related.  Recommend regular diet and encourage p.o. intake.  Neurology will sign off. Please call with questions. [facility] 4 weeks. Thanks for the consult.  Marvel PlanJindong Devinn Hurwitz, MD PhD Stroke Neurology 04/15/2018 9:12 PM     To contact Stroke Continuity provider, please refer to [email or web address]. After hours, contact General Neurology

## 2018-04-16 DIAGNOSIS — Z79899 Other long term (current) drug therapy: Secondary | ICD-10-CM | POA: Diagnosis not present

## 2018-04-16 DIAGNOSIS — G629 Polyneuropathy, unspecified: Secondary | ICD-10-CM | POA: Diagnosis present

## 2018-04-16 DIAGNOSIS — Z66 Do not resuscitate: Secondary | ICD-10-CM | POA: Diagnosis present

## 2018-04-16 DIAGNOSIS — I10 Essential (primary) hypertension: Secondary | ICD-10-CM | POA: Diagnosis present

## 2018-04-16 DIAGNOSIS — E039 Hypothyroidism, unspecified: Secondary | ICD-10-CM | POA: Diagnosis present

## 2018-04-16 DIAGNOSIS — E785 Hyperlipidemia, unspecified: Secondary | ICD-10-CM | POA: Diagnosis present

## 2018-04-16 DIAGNOSIS — D509 Iron deficiency anemia, unspecified: Secondary | ICD-10-CM | POA: Diagnosis present

## 2018-04-16 DIAGNOSIS — H9190 Unspecified hearing loss, unspecified ear: Secondary | ICD-10-CM | POA: Diagnosis present

## 2018-04-16 DIAGNOSIS — R29701 NIHSS score 1: Secondary | ICD-10-CM | POA: Diagnosis present

## 2018-04-16 DIAGNOSIS — D649 Anemia, unspecified: Secondary | ICD-10-CM | POA: Diagnosis not present

## 2018-04-16 DIAGNOSIS — F0390 Unspecified dementia without behavioral disturbance: Secondary | ICD-10-CM | POA: Diagnosis present

## 2018-04-16 DIAGNOSIS — Z87891 Personal history of nicotine dependence: Secondary | ICD-10-CM | POA: Diagnosis not present

## 2018-04-16 DIAGNOSIS — I63422 Cerebral infarction due to embolism of left anterior cerebral artery: Secondary | ICD-10-CM | POA: Diagnosis present

## 2018-04-16 LAB — CBC
HCT: 28.2 % — ABNORMAL LOW (ref 36.0–46.0)
Hemoglobin: 7.6 g/dL — ABNORMAL LOW (ref 12.0–15.0)
MCH: 18.5 pg — ABNORMAL LOW (ref 26.0–34.0)
MCHC: 27 g/dL — ABNORMAL LOW (ref 30.0–36.0)
MCV: 68.6 fL — ABNORMAL LOW (ref 80.0–100.0)
NRBC: 0 % (ref 0.0–0.2)
Platelets: 341 10*3/uL (ref 150–400)
RBC: 4.11 MIL/uL (ref 3.87–5.11)
RDW: 26.5 % — ABNORMAL HIGH (ref 11.5–15.5)
WBC: 4.7 10*3/uL (ref 4.0–10.5)

## 2018-04-16 LAB — BASIC METABOLIC PANEL
Anion gap: 8 (ref 5–15)
BUN: 9 mg/dL (ref 8–23)
CO2: 22 mmol/L (ref 22–32)
Calcium: 9.2 mg/dL (ref 8.9–10.3)
Chloride: 109 mmol/L (ref 98–111)
Creatinine, Ser: 0.78 mg/dL (ref 0.44–1.00)
GFR calc Af Amer: >60 mL/min (ref >60)
GFR calc non Af Amer: >60 mL/min (ref >60)
Glucose, Bld: 94 mg/dL (ref 70–99)
POTASSIUM: 3.8 mmol/L (ref 3.5–5.1)
Sodium: 139 mmol/L (ref 135–145)

## 2018-04-16 LAB — HEMOGLOBIN AND HEMATOCRIT, BLOOD
HCT: 34.6 % — ABNORMAL LOW (ref 36.0–46.0)
Hemoglobin: 9.6 g/dL — ABNORMAL LOW (ref 12.0–15.0)

## 2018-04-16 LAB — PREPARE RBC (CROSSMATCH)

## 2018-04-16 MED ORDER — CYANOCOBALAMIN 500 MCG PO TABS: 500.0000 ug | ORAL_TABLET | Freq: Every day | ORAL | 1 refills | Status: DC

## 2018-04-16 MED ORDER — SODIUM CHLORIDE 0.9% IV SOLUTION
Freq: Once | INTRAVENOUS | Status: AC
  Administered 2018-04-16: 10:00:00 via INTRAVENOUS

## 2018-04-16 MED ORDER — ADULT MULTIVITAMIN W/MINERALS CH
1.0000 | ORAL_TABLET | Freq: Every day | ORAL | Status: DC
  Filled 2018-04-16: qty 1

## 2018-04-16 MED ORDER — ASPIRIN 81 MG PO TBEC: 81.0000 mg | DELAYED_RELEASE_TABLET | Freq: Every day | ORAL | 1 refills | Status: DC

## 2018-04-16 MED ORDER — ENSURE ENLIVE PO LIQD
237.0000 mL | Freq: Two times a day (BID) | ORAL | Status: DC
  Administered 2018-04-16: 237 mL via ORAL

## 2018-04-16 MED ORDER — SODIUM CHLORIDE 0.9 % IV SOLN
510.0000 mg | Freq: Once | INTRAVENOUS | Status: AC
  Administered 2018-04-16: 510 mg via INTRAVENOUS
  Filled 2018-04-16: qty 17

## 2018-04-16 MED ORDER — ENSURE ENLIVE PO LIQD: 237.0000 mL | Freq: Two times a day (BID) | ORAL | 12 refills | Status: DC

## 2018-04-16 MED ORDER — ADULT MULTIVITAMIN W/MINERALS CH: 1.0000 | ORAL_TABLET | Freq: Every day | ORAL | 1 refills | Status: DC

## 2018-04-16 MED ORDER — FERROUS SULFATE 325 (65 FE) MG PO TBEC: 325.0000 mg | DELAYED_RELEASE_TABLET | Freq: Two times a day (BID) | ORAL | 3 refills | Status: DC

## 2018-04-16 NOTE — Progress Notes (Signed)
Pt & family given discharge instructions, prescriptions, and care notes. Pt verbalized understanding AEB no further questions or concerns at this time. IV was discontinued, no redness, pain, or swelling noted at this time. Telemetry discontinued and Centralized Telemetry was notified. Pt left the floor via wheelchair with staff in stable condition.  

## 2018-04-16 NOTE — Discharge Summary (Signed)
Kristen Duttonbbie S Doubek, is a 78 y.o. female  DOB 02/27/1941  MRN [id number].  Admission date:  04/14/2018  Admitting Physician  Jonah BlueJennifer Yates, MD  Discharge Date:  04/16/2018   Primary MD  Jarome MatinPaterson, Daniel, MD  Recommendations for primary care physician for things to follow:  -Check CBC, CMP during next visit -Patient will need to follow with allergy as an outpatient, ambulate referral has been done. -Patient will need 30 days cardiac monitor, see [facility] to arrange for this as an outpatient -Patient will need work-up as an outpatient for parathyroid 2 mm nodule -Patient will need a work-up for iron deficiency anemia as outpatient, needs to be up-to-date on her colonoscopy, and endoscopy if felt indicated.  Admission Diagnosis  Left-sided weakness [R53.1] Anemia, unspecified type [D64.9]   Discharge Diagnosis  Left-sided weakness [R53.1] Anemia, unspecified type [D64.9]   Principal Problem:   CVA (cerebral vascular accident) [facility](HCC) Active Problems:   Symptomatic anemia   Essential hypertension   Dyslipidemia   Dementia (HCC)   Acquired hypothyroidism      Past Medical History:  Diagnosis Date  . Acquired hypothyroidism   . Arthritis    "RLE" (04/15/2018)  . CVA (cerebral vascular accident) (HCC) 04/14/2018   "don't feel any different and daughter doesn't notice any changes" (04/15/2018)  . Dementia (HCC)   . Dyslipidemia   . Essential hypertension   . Headache    "q couple months" (04/15/2018)  . History of blood transfusion 04/14/2018   "low counts" (04/15/2018)  . Migraine    "q couple months" (04/15/2018)  . Peripheral neuropathy     Past Surgical History:  Procedure Laterality Date  . CARPAL TUNNEL RELEASE Left   . CATARACT EXTRACTION, BILATERAL Bilateral   . LAPAROSCOPIC CHOLECYSTECTOMY         History of present illness and  Hospital Course:     Kindly see H&P for history of present  illness and admission details, please review complete Labs, Consult reports and Test reports for all details in brief  HPI  from the history and physical done on the day of admission 04/14/2018  HPI: Kristen Parks is a 78 y.o. female with no significant past medical history presenting with stroke-like symptoms.  She has dementia and can't provide history.  She came in from walking her dog and her granddaughter heard a thunk.  She fell to the floor in a sitting position.  She complained that her left leg and arm felt like they didn't belong to her.  She reported that her hand felt that way yesterday but she didn't report it to family until this AM.  Her family didn't notice problems yesterday.  She also walked up the steps after walking the dog without difficulty, so her granddaughter thinks it just started.  No facial droop, no dysphagia or dysarthria.  She has been complaining of feeling tired, family thought she wasn't getting enough sleep.  She doesn't eat well - she eats what she wants which is mostly snacks, sweets, and honey  nut cheerios.  No apparent DOE.   ED Course:   Left arm/leg weakness since yesterday afternoon.  Patient reported it to family today, but had fall from weakness.  Left-sided arm and leg weakness on exam.  No visual deficit or speech issue.  Dr. Amada Jupiter consulted - MRI pending.  Hgb 5.5, no bleeding, heme negative.  Giving 2 units PRBC.  No CP, SOB.  Hospital Course   78 y.o.femalewith history of hypertension, dyslipidemia, dementia, anemia and hypothyroidism. At approximately 7:30 in the morning her daughters noted they heard a thud and found patient on the floor with her right leg up underneath her.  Reports right lower extremity weakness, work-up significant for anemia of 5.3, and significant for acute CVA.  Acute ischemic CVA -Patient with underlying dementia presenting with inability to bear weight on her right leg which was transient in nature -CT head was  negative for acute CVA, but her MRI was significant for small acute/early subacute infarct in the left corpus callosum and cingulate gyrus. -Negative MRA -CTA neck no carotid stenosis.  Mild distal R VA stenosis.  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  Aortic atherosclerosis. -Most likely anemia with hemoglobin of 5.3 contributing to her hypoperfusion status to protect her acute CVA, hemoglobin was 7.6 today, she transfused IV iron and 1 more unit before discharge -PT/OT/ST/Nutrition Consulted -No antithrombotic prior to admission, now on aspirin 81 mg daily. Given anemia, will continue aspirin 81 mg alone at d/c neuro recommendation  HTN -allow For permissive hypertension, pressure is actually soft, so I will discontinue Norvasc  HLD -LDL is at 39, continue with home dose Zocor  Symptomatic anemia/iron deficiency anemia -Iron deficiency anemia, as well low B12, as iron level of 10, ferritin of 3, and B12 of 208, she is Hemoccult negative, no evidence of GI bleed. - Hemoglobin low 5.3 on admission, she was transfused 2 units, it did improve to 7.6, transfuse another unit today before discharge given her acute CVA on presentation , so work-up significant for anemia of iron deficiency, I will give IV iron today before discharge, and will be discharged on iron supplements .. -Continue with B12 supplements, as her B12 was on the lower normal side at 208, continue with p.o. supplement on discharge, she will need repeat B12 as an outpatient . -We will need further work-up by PCP as an outpatient for iron deficiency anemia, he is Hemoccult negative during hospital stay. 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  Dementia -Her mood and memory may be exacerbated by her CVA -However, the patient was  quite cantankerous and dressed herself to go home when no one was in the room -She does not remember why she is hospitalized -She may be behaviorally challenging while hospitalized  Hypothyroidism -TSH and free T4 within normal limits -Continue Synthroid at current dose for now   Discharge Condition:  stable   Follow UP  Follow-up Information    [name]. Schedule an appointment as soon as possible for a visit in 4 week(s).   Specialty:  Neurology Contact information: [address] [place] [address] [phone]       Jarome Matin, MD Follow up in 1 week(s).   Specialty:  Internal Medicine Contact information: [address] [place] [address] [phone]  Discharge Instructions  and  Discharge Medications     Discharge Instructions    Ambulatory referral to Neurology   Complete by:  As directed    Follow up with stroke clinic NP (Jessica Vanschaick or Darrol Angel, if both not available, consider Manson Allan, or Ahern) at [facility] in about 4 weeks. Thanks.   Discharge instructions   Complete by:  As directed    Follow with Primary MD Jarome Matin, MD in 7 days   Get CBC, CMP,  checked  by Primary MD next visit.    Activity: As tolerated with Full fall precautions use walker/cane & assistance as needed   Disposition Home    Diet: Regular diet.  On your next visit with your primary care physician please Get Medicines reviewed and adjusted.   Please request your Prim.MD to go over all Hospital Tests and Procedure/Radiological results at the follow up, please get all Hospital records sent to your Prim MD by signing hospital release before you go home.   If you experience worsening of your admission symptoms, develop shortness of breath, life threatening emergency, suicidal or homicidal thoughts you must seek medical attention immediately by calling 911 or calling your MD  immediately  if symptoms less severe.  You Must read complete instructions/literature along with all the possible adverse reactions/side effects for all the Medicines you take and that have been prescribed to you. Take any new Medicines after you have completely understood and accpet all the possible adverse reactions/side effects.   Do not drive, operating heavy machinery, perform activities at heights, swimming or participation in water activities or provide baby sitting services if your were admitted for syncope or siezures until you have seen by Primary MD or a Neurologist and advised to do so again.  Do not drive when taking Pain medications.    Do not take more than prescribed Pain, Sleep and Anxiety Medications  Special Instructions: If you have smoked or chewed Tobacco  in the last 2 yrs please stop smoking, stop any regular Alcohol  and or any Recreational drug use.  Wear Seat belts while driving.   Please note  You were cared for by a hospitalist during your hospital stay. If you have any questions about your discharge medications or the care you received while you were in the hospital after you are discharged, you can call the unit and asked to speak with the hospitalist on call if the hospitalist that took care of you is not available. Once you are discharged, your primary care physician will handle any further medical issues. Please note that NO REFILLS for any discharge medications will be authorized once you are discharged, as it is imperative that you return to your primary care physician (or establish a relationship with a primary care physician if you do not have one) for your aftercare needs so that they can reassess your need for medications and monitor your lab values.   Increase activity slowly   Complete by:  As directed      Allergies as of 04/16/2018      Reactions   Latex    Penicillins    REACTION: vomit   Sulfa Antibiotics       Medication List    STOP  taking these medications   amLODipine 5 MG tablet Commonly known as:  NORVASC   ibuprofen 200 MG tablet Commonly known as:  ADVIL,MOTRIN     TAKE these medications   aspirin 81 MG EC tablet Take 1  tablet (81 mg total) by mouth daily. Start taking on:  April 17, 2018   feeding supplement (ENSURE ENLIVE) Liqd Take 237 mLs by mouth 2 (two) times daily between meals.   ferrous sulfate 325 (65 FE) MG EC tablet Take 1 tablet (325 mg total) by mouth 2 (two) times daily.   gabapentin 300 MG capsule Commonly known as:  NEURONTIN Take 300 mg by mouth at bedtime.   hydrOXYzine 25 MG tablet Commonly known as:  ATARAX/VISTARIL Take 25 mg by mouth at bedtime as needed for itching.   levothyroxine 112 MCG tablet Commonly known as:  SYNTHROID, LEVOTHROID Take 112 mcg by mouth daily.   multivitamin with minerals Tabs tablet Take 1 tablet by mouth daily.   omeprazole 40 MG capsule Commonly known as:  PRILOSEC Take 40 mg by mouth daily.   simvastatin 20 MG tablet Commonly known as:  ZOCOR Take 20 mg by mouth at bedtime.   vitamin B-12 500 MCG tablet Commonly known as:  CYANOCOBALAMIN Take 1 tablet (500 mcg total) by mouth daily.         Diet and Activity recommendation: See Discharge Instructions above   Consults obtained -  Neurology   Major procedures and Radiology Reports - PLEASE review detailed and final reports for all details, in brief -      Ct Head Wo Contrast  Result Date: 04/14/2018 CLINICAL DATA:  Larey Seat.  Lower extremity numbness. EXAM: CT HEAD WITHOUT CONTRAST CT CERVICAL SPINE WITHOUT CONTRAST TECHNIQUE: Multidetector CT imaging of the head and cervical spine was performed following the standard protocol without intravenous contrast. Multiplanar CT image reconstructions of the cervical spine were also generated. COMPARISON:  None. FINDINGS: CT HEAD FINDINGS Brain: Age related cerebral atrophy, ventriculomegaly and periventricular white matter disease. No  extra-axial fluid collections are identified. No CT findings for acute hemispheric infarction or intracranial hemorrhage. No mass lesions. The brainstem and cerebellum are normal. Vascular: Minimal vascular calcifications. No obvious aneurysm or hyperdense vessels. Skull: No skull fracture or bone lesion. Mild hyperostosis frontalis interna. Sinuses/Orbits: The paranasal sinuses and mastoid air cells are clear. The globes are intact. Other: No scalp lesions or hematoma. CT CERVICAL SPINE FINDINGS Alignment: Normal overall alignment. Mild degenerative cervical spondylosis with multilevel disc disease and facet disease. Skull base and vertebrae: The skull base C1 and C1-2 articulations are maintained. Moderate degenerative changes at C1-2. No dens fracture. No vertebral body, facet or laminar fractures. Soft tissues and spinal canal: No prevertebral fluid or swelling. No visible canal hematoma. Disc levels: Generous spinal canal without findings for spinal or foraminal stenosis. No large disc protrusions. Upper chest: The lung apices are grossly clear. Other: No neck mass or lymphadenopathy. IMPRESSION: 1. Age related cerebral atrophy, ventriculomegaly and periventricular white matter disease. 2. No acute intracranial findings or skull fracture. 3. Normal alignment of the cervical vertebral bodies and no acute cervical spine fracture. Generous spinal without obvious large disc protrusion or canal compromise. Electronically Signed   By: Rudie Meyer M.D.   On: 04/14/2018 10:00   Ct Angio Neck W Or Wo Contrast  Result Date: 04/15/2018 CLINICAL DATA:  Stroke follow-up. EXAM: CT ANGIOGRAPHY NECK TECHNIQUE: Multidetector CT imaging of the neck was performed using the standard protocol during bolus administration of intravenous contrast. Multiplanar CT image reconstructions and MIPs were obtained to evaluate the vascular anatomy. Carotid stenosis measurements (when applicable) are obtained utilizing NASCET criteria,  using the distal internal carotid diameter as the denominator. CONTRAST:  50mL ISOVUE-370 IOPAMIDOL (ISOVUE-370) INJECTION  76% COMPARISON:  None. FINDINGS: Aortic arch: Standard 3 vessel aortic arch with mild atherosclerotic plaque. Widely patent arch vessel origins. Right carotid system: Patent without evidence of stenosis or dissection. Beaded appearance of the mid cervical ICA not felt to be Sully due to mild motion artifact in this region. Left carotid system: Patent without evidence of stenosis or dissection. Possible mild beading of the mid cervical ICA. Vertebral arteries: Patent without evidence of flow limiting stenosis or dissection. Mild right V3 and V4 stenoses. Slight beading of both V3 segments. Skeleton: Mild cervical spondylosis. Other neck: Diminutive thyroid. 1.3 x 0.8 x 2.0 cm avidly enhancing mass posterior to the expected region of the left thyroid lobe separate from the proximal esophagus. Upper chest: Clear lung apices. IMPRESSION: 1. No large vessel occlusion or significant carotid artery stenosis. 2. Mild distal right vertebral artery stenoses. 3. 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 4. 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 5.  Aortic Atherosclerosis (ICD10-I70.0). Electronically Signed   By: Sebastian Ache M.D.   On: 04/15/2018 12:18   Ct Cervical Spine Wo Contrast  Result Date: 04/14/2018 CLINICAL DATA:  Larey Seat.  Lower extremity numbness. EXAM: CT HEAD WITHOUT CONTRAST CT CERVICAL SPINE WITHOUT CONTRAST TECHNIQUE: Multidetector CT imaging of the head and cervical spine was performed following the standard protocol without intravenous contrast. Multiplanar CT image reconstructions of the cervical spine were also generated. COMPARISON:  None. FINDINGS: CT HEAD FINDINGS Brain: Age related cerebral atrophy, ventriculomegaly and periventricular white matter disease. No extra-axial fluid collections  are identified. No CT findings for acute hemispheric infarction or intracranial hemorrhage. No mass lesions. The brainstem and cerebellum are normal. Vascular: Minimal vascular calcifications. No obvious aneurysm or hyperdense vessels. Skull: No skull fracture or bone lesion. Mild hyperostosis frontalis interna. Sinuses/Orbits: The paranasal sinuses and mastoid air cells are clear. The globes are intact. Other: No scalp lesions or hematoma. CT CERVICAL SPINE FINDINGS Alignment: Normal overall alignment. Mild degenerative cervical spondylosis with multilevel disc disease and facet disease. Skull base and vertebrae: The skull base C1 and C1-2 articulations are maintained. Moderate degenerative changes at C1-2. No dens fracture. No vertebral body, facet or laminar fractures. Soft tissues and spinal canal: No prevertebral fluid or swelling. No visible canal hematoma. Disc levels: Generous spinal canal without findings for spinal or foraminal stenosis. No large disc protrusions. Upper chest: The lung apices are grossly clear. Other: No neck mass or lymphadenopathy. IMPRESSION: 1. Age related cerebral atrophy, ventriculomegaly and periventricular white matter disease. 2. No acute intracranial findings or skull fracture. 3. Normal alignment of the cervical vertebral bodies and no acute cervical spine fracture. Generous spinal without obvious large disc protrusion or canal compromise. Electronically Signed   By: Rudie Meyer M.D.   On: 04/14/2018 10:00   Mr Brain Wo Contrast  Result Date: 04/14/2018 CLINICAL DATA:  78 y/o F; left-sided weakness and numbness with fall. EXAM: MRI HEAD WITHOUT CONTRAST MRA HEAD WITHOUT CONTRAST TECHNIQUE: Multiplanar, multiecho pulse sequences of the brain and surrounding structures were obtained without intravenous contrast. Angiographic images of the head were obtained using MRA technique without contrast. COMPARISON:  04/14/2018 CT head FINDINGS: MRI HEAD FINDINGS Brain: Focus of  reduced diffusion compatible with acute/early subacute infarction spanning approximately 2 cm within left body of corpus callosum and cingulate gyrus (series 3, image 29 and series 650, image 14). No associated hemorrhage or mass effect. Several nonspecific T2 FLAIR hyperintensities in predominantly periventricular white  matter are compatible with mild to moderate chronic microvascular ischemic changes for age. Mild volume loss of the brain. Small chronic infarcts are present within the left cerebellar hemisphere and the bilateral thalami. No extra-axial collection, hydrocephalus, mass effect, or herniation. Vascular: Normal flow voids. Skull and upper cervical spine: Normal marrow signal. Sinuses/Orbits: Negative. Other: Bilateral intra-ocular lens replacement. MRA HEAD FINDINGS Internal carotid arteries:  Patent. Anterior cerebral arteries:  Patent. Middle cerebral arteries: Patent. Anterior communicating artery: Patent. Posterior communicating arteries: Patent right. No left identified, likely hypoplastic or absent. Posterior cerebral arteries:  Patent. Basilar artery:  Patent. Vertebral arteries:  Patent. No evidence of high-grade stenosis, large vessel occlusion, or aneurysm unless noted above. IMPRESSION: MRI head: 1. Small acute/early subacute infarction involving left body of corpus callosum and cingulate gyrus. No associated hemorrhage or mass effect. 2. Mild-to-moderate chronic microvascular ischemic changes and mild volume loss of the brain. MRA head: Patent anterior and posterior intracranial circulation. No large vessel occlusion, aneurysm, or significant stenosis is identified. These results will be called to the ordering clinician or representative by the Radiologist Assistant, and communication documented in the PACS or zVision Dashboard. Electronically Signed   By: Mitzi Hansen M.D.   On: 04/14/2018 14:04   Mr Maxine Glenn Head Wo Contrast  Result Date: 04/14/2018 CLINICAL DATA:  78 y/o F;  left-sided weakness and numbness with fall. EXAM: MRI HEAD WITHOUT CONTRAST MRA HEAD WITHOUT CONTRAST TECHNIQUE: Multiplanar, multiecho pulse sequences of the brain and surrounding structures were obtained without intravenous contrast. Angiographic images of the head were obtained using MRA technique without contrast. COMPARISON:  04/14/2018 CT head FINDINGS: MRI HEAD FINDINGS Brain: Focus of reduced diffusion compatible with acute/early subacute infarction spanning approximately 2 cm within left body of corpus callosum and cingulate gyrus (series 3, image 29 and series 650, image 14). No associated hemorrhage or mass effect. Several nonspecific T2 FLAIR hyperintensities in predominantly periventricular white matter are compatible with mild to moderate chronic microvascular ischemic changes for age. Mild volume loss of the brain. Small chronic infarcts are present within the left cerebellar hemisphere and the bilateral thalami. No extra-axial collection, hydrocephalus, mass effect, or herniation. Vascular: Normal flow voids. Skull and upper cervical spine: Normal marrow signal. Sinuses/Orbits: Negative. Other: Bilateral intra-ocular lens replacement. MRA HEAD FINDINGS Internal carotid arteries:  Patent. Anterior cerebral arteries:  Patent. Middle cerebral arteries: Patent. Anterior communicating artery: Patent. Posterior communicating arteries: Patent right. No left identified, likely hypoplastic or absent. Posterior cerebral arteries:  Patent. Basilar artery:  Patent. Vertebral arteries:  Patent. No evidence of high-grade stenosis, large vessel occlusion, or aneurysm unless noted above. IMPRESSION: MRI head: 1. Small acute/early subacute infarction involving left body of corpus callosum and cingulate gyrus. No associated hemorrhage or mass effect. 2. Mild-to-moderate chronic microvascular ischemic changes and mild volume loss of the brain. MRA head: Patent anterior and posterior intracranial circulation. No large  vessel occlusion, aneurysm, or significant stenosis is identified. These results will be called to the ordering clinician or representative by the Radiologist Assistant, and communication documented in the PACS or zVision Dashboard. Electronically Signed   By: Mitzi Hansen M.D.   On: 04/14/2018 14:04    Micro Results   No results found for this or any previous visit (from the past 240 hour(s)).     Today   Subjective:   Brilee Grismore today has no headache,no chest abdominal pain,no new weakness tingling or numbness, feels much better wants to go home today.   Objective:   Blood pressure Marland Kitchen)  123/54, pulse 74, temperature 98.2 F (36.8 C), temperature source Oral, resp. rate 16, height 5\' 2"  (1.575 m), weight 70.3 kg, SpO2 97 %.   Intake/Output Summary (Last 24 hours) at 04/16/2018 1442 Last data filed at 04/16/2018 1300 Gross per 24 hour  Intake 1237.83 ml  Output -  Net 1237.83 ml    Exam Awake Alert, Oriented x 3, No new F.N deficits, Normal affect Symmetrical Chest wall movement, Good air movement bilaterally, CTAB RRR,No Gallops,Rubs or new Murmurs, No Parasternal Heave +ve B.Sounds, Abd Soft, Non tender, No organomegaly appriciated, No rebound -guarding or rigidity. No Cyanosis, Clubbing or edema, No new Rash or bruise  Data Review   CBC w Diff:  Lab Results  Component Value Date   WBC 4.7 04/16/2018   HGB 7.6 (L) 04/16/2018   HCT 28.2 (L) 04/16/2018   PLT 341 04/16/2018   LYMPHOPCT 21 04/14/2018   MONOPCT 12 04/14/2018   EOSPCT 3 04/14/2018   BASOPCT 1 04/14/2018    CMP:  Lab Results  Component Value Date   NA 139 04/16/2018   K 3.8 04/16/2018   CL 109 04/16/2018   CO2 22 04/16/2018   BUN 9 04/16/2018   CREATININE 0.78 04/16/2018   PROT 6.9 04/14/2018   ALBUMIN 3.6 04/14/2018   BILITOT 0.4 04/14/2018   ALKPHOS 90 04/14/2018   AST 18 04/14/2018   ALT 11 04/14/2018  .   Total Time in preparing paper work, data evaluation and todays exam  - 35 minutes  Huey Bienenstock M.D on 04/16/2018 at 2:42 PM  [facility]   Office  [phone]

## 2018-04-16 NOTE — Progress Notes (Signed)
Initial Nutrition Assessment  DOCUMENTATION CODES:   Not applicable  INTERVENTION:   - MVI with minerals daily  - Ensure Enlive po BID, each supplement provides 350 kcal and 20 grams of protein (chocolate)  NUTRITION DIAGNOSIS:   Inadequate protein intake related to other (nutrition-related knowledge deficit) as evidenced by per patient/family report.  GOAL:   Patient will meet greater than or equal to 90% of their needs  MONITOR:   PO intake, Supplement acceptance, Weight trends, Labs  REASON FOR ASSESSMENT:   Consult Other ("very poor diet")  ASSESSMENT:   78 year old female who presented to the ED on 12/31 with stroke-like symptoms. PMH significant for dementia, HTN, dyslipidemia, anemia. Pt admitted for further CVA evaluation.  Spoke with pt and son at bedside. RN in room providing nursing care. Pt receiving blood infusion at time of visit.  Pt states that she typically has a good appetite and has not noticed any changes in her appetite recently. Pt reports typically eating 2 meals daily. Per pt, breakfast includes eggs, bacon, and grits or cereal. Per pt, dinner includes chicken and vegetables. Pt's son disagrees, stating pt eats mostly "junk food and sweets" like candy and cookies. Pt states that she only eats 3-4 cookies daily. Suspect pt is eating more snack foods than regular mealtime foods.  Pt reports that she does not take a MVI but knows that she should. RD to order during this admission. Encouraged continued MVI use after d/c.  RD provided education regarding importance of adequate protein intake in maintaining lean muscle mass. Also discussed importance of adequate fruit/vegetable intake for fiber, vitamins, and minerals. Pt expresses understanding, states "I love vegetables."  Pt denies weight loss and reports her UBW as 150-160 lbs. Pt states, "I don't even know what I weigh." Per weight history in chart, pt with weight gain over the last 1 year.  RD to  order oral nutrition supplement for pt. Pt agreeable. Pt's son states that he bought some oral nutrition supplements for pt after discharge.  Pt denies any issues chewing or swallowing. Pt denies N/V.  Medications reviewed and include: levothyroxine, Protonix, vitamin B-12  Labs reviewed: hemoglobin 7.6 (L)  NUTRITION - FOCUSED PHYSICAL EXAM:    Most Recent Value  Orbital Region  Mild depletion  Upper Arm Region  No depletion  Thoracic and Lumbar Region  No depletion  Buccal Region  No depletion  Temple Region  Mild depletion  Clavicle Bone Region  No depletion  Clavicle and Acromion Bone Region  No depletion  Scapular Bone Region  No depletion  Dorsal Hand  No depletion  Patellar Region  No depletion  Anterior Thigh Region  No depletion  Posterior Calf Region  Mild depletion  Edema (RD Assessment)  None  Hair  Reviewed  Eyes  Reviewed  Mouth  Reviewed  Skin  Reviewed  Nails  Reviewed       Diet Order:   Diet Order            Diet Heart Room service appropriate? Yes; Fluid consistency: Thin  Diet effective now              EDUCATION NEEDS:   Education needs have been addressed  Skin:  Skin Assessment: Reviewed RN Assessment  Last BM:  PTA/unknown  Height:   Ht Readings from Last 1 Encounters:  04/14/18 5\' 2"  (1.575 m)    Weight:   Wt Readings from Last 1 Encounters:  04/14/18 70.3 kg    Ideal Body  Weight:  50 kg  BMI:  Body mass index is 28.35 kg/m.  Estimated Nutritional Needs:   Kcal:  1500-1700  Protein:  75-90 grams  Fluid:  1.5-1.7 L    Earma ReadingKate Jablonski Cameryn Chrisley, MS, RD, LDN Inpatient Clinical Dietitian Pager: [phone] Weekend/After Hours: [phone]

## 2018-04-16 NOTE — Progress Notes (Signed)
OT Cancellation Note  Patient Details Name: Kristen Parks MRN: [id number] DOB: 01-05-1941   Cancelled Treatment:    Reason Eval/Treat Not Completed: OT screened, no needs identified, will sign off. Per chart review, pt appears to be at baseline with ADLs. Will sign off. If OT needs should arise, please reorder.  Raynald Kemp, OT Acute Rehabilitation Services Pager: [phone] Office: [phone]  04/16/2018, 10:06 AM

## 2018-04-16 NOTE — Discharge Instructions (Signed)
Follow with Primary MD Jarome Matin, MD in 7 days   Get CBC, CMP,  checked  by Primary MD next visit.    Activity: As tolerated with Full fall precautions use walker/cane & assistance as needed   Disposition Home    Diet: Regular diet.  On your next visit with your primary care physician please Get Medicines reviewed and adjusted.   Please request your Prim.MD to go over all Hospital Tests and Procedure/Radiological results at the follow up, please get all Hospital records sent to your Prim MD by signing hospital release before you go home.   If you experience worsening of your admission symptoms, develop shortness of breath, life threatening emergency, suicidal or homicidal thoughts you must seek medical attention immediately by calling 911 or calling your MD immediately  if symptoms less severe.  You Must read complete instructions/literature along with all the possible adverse reactions/side effects for all the Medicines you take and that have been prescribed to you. Take any new Medicines after you have completely understood and accpet all the possible adverse reactions/side effects.   Do not drive, operating heavy machinery, perform activities at heights, swimming or participation in water activities or provide baby sitting services if your were admitted for syncope or siezures until you have seen by Primary MD or a Neurologist and advised to do so again.  Do not drive when taking Pain medications.    Do not take more than prescribed Pain, Sleep and Anxiety Medications  Special Instructions: If you have smoked or chewed Tobacco  in the last 2 yrs please stop smoking, stop any regular Alcohol  and or any Recreational drug use.  Wear Seat belts while driving.   Please note  You were cared for by a hospitalist during your hospital stay. If you have any questions about your discharge medications or the care you received while you were in the hospital after you are  discharged, you can call the unit and asked to speak with the hospitalist on call if the hospitalist that took care of you is not available. Once you are discharged, your primary care physician will handle any further medical issues. Please note that NO REFILLS for any discharge medications will be authorized once you are discharged, as it is imperative that you return to your primary care physician (or establish a relationship with a primary care physician if you do not have one) for your aftercare needs so that they can reassess your need for medications and monitor your lab values.

## 2018-04-16 NOTE — Progress Notes (Signed)
Patient is alert and oriented,  patient is irritable and said she does not want to be bordered. Morning synthroid administered, vital signs WNL,  patient's son is in the room with patient through the night. no sign of distress noted.

## 2018-04-17 ENCOUNTER — Other Ambulatory Visit: Payer: Self-pay | Admitting: Medical

## 2018-04-17 DIAGNOSIS — I63422 Cerebral infarction due to embolism of left anterior cerebral artery: Secondary | ICD-10-CM

## 2018-04-17 LAB — TYPE AND SCREEN
ABO/RH(D): A POS
ANTIBODY SCREEN: NEGATIVE
Unit division: 0
Unit division: 0
Unit division: 0

## 2018-04-17 LAB — BPAM RBC
Blood Product Expiration Date: 202001192359
Blood Product Expiration Date: 202001192359
Blood Product Expiration Date: 202001212359
ISSUE DATE / TIME: 201912311640
ISSUE DATE / TIME: 201912312057
ISSUE DATE / TIME: 202001021022
Unit Type and Rh: 6200
Unit Type and Rh: 6200
Unit Type and Rh: 6200

## 2018-04-24 ENCOUNTER — Telehealth: Payer: Self-pay

## 2018-04-24 NOTE — Telephone Encounter (Signed)
SENT REFERRAL TO SCHEDULING AND FILED NOTES 

## 2018-05-01 ENCOUNTER — Other Ambulatory Visit: Payer: Self-pay | Admitting: *Deleted

## 2018-05-01 DIAGNOSIS — N8110 Cystocele, unspecified: Secondary | ICD-10-CM | POA: Insufficient documentation

## 2018-05-01 NOTE — Patient Outreach (Addendum)
[facility]) Care Management  05/01/2018  Kristen Parks [birth year] [id number]   EMMI- stroke  RED ON EMMI ALERT Day # 13 Date: Thursday 04/30/2018 Red Alert Reason: Micah Flesher to follow-up appointment? No Insurance: united health care medicare  [facility] admissions x 1 ED visits x 1 in the last 6 months    Outreach attempt # 1  Patient is able to verify HIPAA [facility] Care Management RN reviewed and addressed red alert with patient   Kristen Parks voiced frustration that she did not go to her f/u appointment because she did not have a ride. [facility] RN CM offered [facility] SW as a resource for transportation assistance to either her primary or neurology appointments but Kristen Parks denies need of the [facility] SW services and reports she will go to her appointment with the assistance of her brother. She voiced concern about receiving calls to follow up on the EMMI calls.  She states the calls are not going to "make me go" She is noted with a neurology f/u appointment scheduled for 05/29/2018 with Galen Daft  She denies all other concerns that [facility] staff could assist her with    Transition of care services noted to be completed by primary care MD office staff- [facility] Parks [facility]  Transition of Care will be completed by primary care provider office who will refer to [facility] care management if needed. Plan: [facility] RN CM will close case.   Conditions:  CVA, Left sided weakness, anemia, HTN, HDL, Dementia, acquired hypothyroidism, migraine,    Advance Directives: Denies need for assist with advance directives      Consent: [facility] RN CM reviewed [facility] services with patient. Patient gave verbal consent for services [facility] telephonic RN CM.   Advised patient that there will be further automated EMMI- post discharge calls to assess how the patient is doing following the recent hospitalization Advised the patient that another call may be received from a nurse if any of their responses were  abnormal. Patient voiced understanding and was appreciative of f/u call.   Plan: [facility] RN CM will close case at this time as patient has been assessed and no needs identified/needs resolved.   [facility] RN CM sent a successful outreach letter with [facility] brochure enclosed for review Routed note to MD   Cala Bradford L. Noelle Penner, RN, BSN, CCM [facility] Telephonic Care Management Care Coordinator Office number [phone] Mobile number [phone]  Main [facility] number [phone] Fax number [phone]

## 2018-05-11 ENCOUNTER — Other Ambulatory Visit: Payer: Self-pay | Admitting: Medical

## 2018-05-11 DIAGNOSIS — I4891 Unspecified atrial fibrillation: Secondary | ICD-10-CM

## 2018-05-11 DIAGNOSIS — I639 Cerebral infarction, unspecified: Secondary | ICD-10-CM

## 2018-05-11 DIAGNOSIS — I63422 Cerebral infarction due to embolism of left anterior cerebral artery: Secondary | ICD-10-CM

## 2018-05-12 ENCOUNTER — Ambulatory Visit (INDEPENDENT_AMBULATORY_CARE_PROVIDER_SITE_OTHER): Payer: Medicare Other

## 2018-05-12 DIAGNOSIS — I63422 Cerebral infarction due to embolism of left anterior cerebral artery: Secondary | ICD-10-CM | POA: Diagnosis not present

## 2018-05-12 DIAGNOSIS — I4891 Unspecified atrial fibrillation: Secondary | ICD-10-CM | POA: Diagnosis not present

## 2018-05-12 DIAGNOSIS — I639 Cerebral infarction, unspecified: Secondary | ICD-10-CM

## 2018-05-27 ENCOUNTER — Other Ambulatory Visit: Payer: Self-pay | Admitting: *Deleted

## 2018-05-27 NOTE — Patient Outreach (Signed)
[facility]) Care Management  05/27/2018  DEIANIRA CLETO 1941-03-10 [id number]   Care coordination  [facility] RN CM received an incoming call from Mrs Crisman stating she had received CM's outreach letter and wanted to know if she could get a physical from [facility] Mrs Zola was able to verify HIPAA [facility] RN CM reviewed with Mrs Helmreich that she and [facility] RN CM had spoken on 05/01/2018 for an EMMI referral and the discussion of availability of [facility] SW to assist with transportation to medical appointments prn to include her denial of need of assistance from [facility] SW. She voiced understanding. CM discussed [facility] services in details, referring her to the [facility] brochure and the reason the successful outreach letter was sent to her. She voiced understanding. CM discussed that [facility] did not have doctors to provided physicals and encouraged a call to Dr Silvano Rusk office to schedule an annual physical. She voiced understanding  She denies any medical needs at this time and thanked [facility] RN CM for her explanations  Mayari Matus L. Noelle Penner, RN, BSN, CCM [facility] Telephonic Care Management Care Coordinator Office number [phone] Mobile number [phone]  Main [facility] number [phone] Fax number [phone]

## 2018-05-29 ENCOUNTER — Ambulatory Visit: Payer: Medicare Other | Admitting: Adult Health

## 2018-05-29 ENCOUNTER — Encounter: Payer: Self-pay | Admitting: Adult Health

## 2018-05-29 VITALS — BP 121/69 | HR 82 | Ht 62.0 in | Wt 137.4 lb

## 2018-05-29 DIAGNOSIS — G4709 Other insomnia: Secondary | ICD-10-CM | POA: Diagnosis not present

## 2018-05-29 DIAGNOSIS — E785 Hyperlipidemia, unspecified: Secondary | ICD-10-CM | POA: Diagnosis not present

## 2018-05-29 DIAGNOSIS — R5383 Other fatigue: Secondary | ICD-10-CM

## 2018-05-29 DIAGNOSIS — I10 Essential (primary) hypertension: Secondary | ICD-10-CM

## 2018-05-29 DIAGNOSIS — I639 Cerebral infarction, unspecified: Secondary | ICD-10-CM | POA: Diagnosis not present

## 2018-05-29 NOTE — Progress Notes (Signed)
[name] [address] [place]. [place] [address] [phone]       OFFICE FOLLOW UP NOTE  [name] Date of Birth:  Dec 18, 1940 Medical Record Number:  [id number]   Reason for Referral:  hospital stroke follow up  CHIEF COMPLAINT:  Chief Complaint  Patient presents with  . Follow-up    hospital Stroke follow up room 9 pt with [name]    . 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 History obtained from patient, daughter and chart review. Reviewed all radiology images and labs personally.  78 year old female with history of dementia, hyperlipidemia, hypertension, anemia, decreased appetite admitted for leg give out and fall at home.  CT negative for acute abnormality.  MRI showed left ACA infarct.  MRA negative.  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  EF 66 to 65%.  A1c 5.1 and LDL 39.  B12 208 and creatinine 0.75.    Stroke determined etiology embolic pattern, recommended 25-DGU cardiac event monitor as outpatient to rule out atrial fibrillation.  Due to history of anemia, DAPT not recommended at this time and recommended aspirin 81 mg at discharge for secondary stroke prevention.  HTN stable and recommended long-term BP goal normotensive range.  Due to low LDL, recommended to discontinue statin use and follow-up outpatient for repeat lipid panel and ongoing monitoring.  Other stroke risk factors include advanced age, and former tobacco use.  Other active problems include baseline dementia, anemia and hypothyroidism.  She was discharged home with family in stable condition without therapy needs.  [name] is being seen today for hospital follow-up and is accompanied by her daughter.  Overall, she continues to do well from a stroke standpoint without residual deficits or  reoccurring of symptoms.  She continues to reside with her daughter but is able to maintain ADLs independently.  She continues on aspirin without side effects of bleeding or bruising.  It was recommended by neurology team during admission to discontinue simvastatin due to low LDL level but it appears as though she was discharged with continuation of simvastatin.  She denies bleeding or bruising at this time.  Blood pressure today satisfactory at 121/69.  30-day cardiac event monitor was initiated on 05/12/2018 and will be completed on 06/11/2018.  She does endorse weekly headaches which have been occurring for the past year and are associated with phonophobia but denies photophobia or N/V.  She does endorse daytime fatigue, insomnia and RLS.  Underlying dementia has been stable since stroke.  Denies new or worsening stroke/TIA symptoms.     ROS:   14 system review of systems performed and negative with exception of headache  PMH:  Past Medical History:  Diagnosis Date  . Acquired hypothyroidism   . Arthritis    "RLE" (04/15/2018)  . CVA (cerebral vascular accident) (HCC) 04/14/2018   "don't feel any different and daughter doesn't notice any changes" (04/15/2018)  . Dementia (HCC)   . Dyslipidemia   . Essential hypertension   . Headache    "q couple months" (04/15/2018)  . History of blood transfusion 04/14/2018   "low counts" (04/15/2018)  . Migraine    "q couple months" (04/15/2018)  . Peripheral neuropathy     PSH:  Past Surgical History:  Procedure Laterality Date  . CARPAL TUNNEL RELEASE Left   .  CATARACT EXTRACTION, BILATERAL Bilateral   . LAPAROSCOPIC CHOLECYSTECTOMY      Social History:  Social History   Socioeconomic History  . Marital status: Divorced    Spouse name: Not on file  . Number of children: Not on file  . Years of education: Not on file  . Highest education level: Not on file  Occupational History  . Occupation: retired  Engineer, production  . Financial resource  strain: Not on file  . Food insecurity:    Worry: Not on file    Inability: Not on file  . Transportation needs:    Medical: Not on file    Non-medical: Not on file  Tobacco Use  . Smoking status: Former Smoker    Packs/day: 0.50    Years: 20.00    Pack years: 10.00    Last attempt to quit: 1968    Years since quitting: 52.1  . Smokeless tobacco: Never Used  Substance and Sexual Activity  . Alcohol use: No    Comment: 04/15/2018 "used to have a drink once/month; nothing since <1980s"  . Drug use: No  . Sexual activity: Yes    Birth control/protection: Post-menopausal  Lifestyle  . Physical activity:    Days per week: Not on file    Minutes per session: Not on file  . Stress: Not on file  Relationships  . Social connections:    Talks on phone: Not on file    Gets together: Not on file    Attends religious service: Not on file    Active member of club or organization: Not on file    Attends meetings of clubs or organizations: Not on file    Relationship status: Not on file  . Intimate partner violence:    Fear of current or ex partner: Not on file    Emotionally abused: Not on file    Physically abused: Not on file    Forced sexual activity: Not on file  Other Topics Concern  . Not on file  Social History Narrative  . Not on file    Family History: History reviewed. No pertinent family history.  Medications:   Current Outpatient Medications on File Prior to Visit  Medication Sig Dispense Refill  . aspirin EC 81 MG EC tablet Take 1 tablet (81 mg total) by mouth daily. 30 tablet 1  . feeding supplement, ENSURE ENLIVE, (ENSURE ENLIVE) LIQD Take 237 mLs by mouth 2 (two) times daily between meals. 237 mL 12  . ferrous sulfate 325 (65 FE) MG EC tablet Take 1 tablet (325 mg total) by mouth 2 (two) times daily. 60 tablet 3  . gabapentin (NEURONTIN) 300 MG capsule Take 300 mg by mouth at bedtime.     . hydrOXYzine (ATARAX/VISTARIL) 25 MG tablet Take 25 mg by mouth at bedtime  as needed for itching.    . levothyroxine (SYNTHROID, LEVOTHROID) 112 MCG tablet Take 112 mcg by mouth daily.     . Multiple Vitamin (MULTIVITAMIN WITH MINERALS) TABS tablet Take 1 tablet by mouth daily. 30 tablet 1  . omeprazole (PRILOSEC) 40 MG capsule Take 40 mg by mouth daily.    . simvastatin (ZOCOR) 20 MG tablet Take 20 mg by mouth at bedtime.     . vitamin B-12 (CYANOCOBALAMIN) 500 MCG tablet Take 1 tablet (500 mcg total) by mouth daily. 30 tablet 1   No current facility-administered medications on file prior to visit.     Allergies:   Allergies  Allergen Reactions  .  Latex   . Penicillins     REACTION: vomit  . Sulfa Antibiotics      Physical Exam  Vitals:   05/29/18 0840  BP: 121/69  Pulse: 82  Weight: 137 lb 6.4 oz (62.3 kg)  Height: 5\' 2"  (1.575 m)   Body mass index is 25.13 kg/m. No exam data present  General: well developed, well nourished, pleasant elderly female, seated, in no evident distress Head: head normocephalic and atraumatic.   Neck: supple with no carotid or supraclavicular bruits Cardiovascular: regular rate and rhythm, no murmurs Musculoskeletal: no deformity Skin:  no rash/petichiae Vascular:  Normal pulses all extremities  Neurologic Exam Mental Status: Awake and fully alert. Oriented to place and time. Recent and remote memory intact. Attention span, concentration and fund of knowledge appropriate. Mood and affect appropriate.  Cranial Nerves: Fundoscopic exam reveals sharp disc margins. Pupils equal, briskly reactive to light. Extraocular movements full without nystagmus. Visual fields full to confrontation. Hearing intact. Facial sensation intact. Face, tongue, palate moves normally and symmetrically.  Motor: Normal bulk and tone. Normal strength in all tested extremity muscles. Sensory.: intact to touch , pinprick , position and vibratory sensation.  Coordination: Rapid alternating movements normal in all extremities. Finger-to-nose  performed bilaterally but difficulty with heel-to-shin due to bilateral knee pain (chronic arthritis) Gait and Station: Arises from chair without difficulty. Stance is normal. Gait demonstrates normal stride length and balance.  Unable to heel, toe and tandem walk due to difficulty Reflexes: 1+ and symmetric. Toes downgoing.    NIHSS  0 Modified Rankin  0    Diagnostic Data (Labs, Imaging, Testing)  CT HEAD WO CONTRAST 04/14/2018 IMPRESSION: 1. Age related cerebral atrophy, ventriculomegaly and periventricular white matter disease. 2. No acute intracranial findings or skull fracture. 3. Normal alignment of the cervical vertebral bodies and no acute cervical spine fracture. Generous spinal without obvious large disc protrusion or canal compromise.  CT ANGIO NECK W OR WO CONTRAST 04/15/2018 IMPRESSION: 1. No large vessel occlusion or significant carotid artery stenosis. 2. Mild distal right vertebral artery stenoses. 3. 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 4. 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 5.  Aortic Atherosclerosis (ICD10-I70.0).  MR BRAIN WO CONTRAST MR MRA HEAD  04/14/2018 IMPRESSION: MRI head: 1. Small acute/early subacute infarction involving left body of corpus callosum and cingulate gyrus. No associated hemorrhage or mass effect. 2. Mild-to-moderate chronic microvascular ischemic changes and mild volume loss of the brain. MRA head: Patent anterior and posterior intracranial circulation. No large vessel occlusion, aneurysm, or significant stenosis is identified.  ECHOCARDIOGRAM 04/15/2018 Study Conclusions - Left ventricle: The cavity size was normal. Systolic function was   normal. The estimated ejection fraction was in the range of 60%   to 65%. Wall motion was normal; there were no regional wall   motion abnormalities. Doppler parameters are consistent with    abnormal left ventricular relaxation (grade 1 diastolic   dysfunction). Impressions: - No cardiac source of emboli was indentified.    ASSESSMENT: Kristen Parks is a 78 y.o. year old female here with left ACA infarct embolic pattern on 04/14/2018 secondary to unknown source. Vascular risk factors include HTN, HLD and dementia.  She is being seen today for hospital follow-up and has been stable from a stroke standpoint without residual deficits or reoccurring of symptoms.    PLAN:  1. Left ACA infarct: Continue aspirin 81 mg daily  for secondary stroke prevention.  Maintain strict control of hypertension with blood pressure goal below 130/90, diabetes with hemoglobin A1c goal below 6.5% and cholesterol with LDL cholesterol (bad cholesterol) goal below 70 mg/dL.  I also advised the patient to eat a healthy diet with plenty of whole grains, cereals, fruits and vegetables, exercise regularly with at least 30 minutes of continuous activity daily and maintain ideal body weight.  Complete 30-day cardiac event monitor around 06/12/2018 to assess for potential atrial fibrillation 2. HTN: Advised to continue current treatment regimen.  Today's BP 121/69.  Advised to continue to monitor at home along with continued follow-up with PCP for management 3. HLD: Continue simvastatin 20 mg at this time but recommend following up with PCP for ongoing lipid monitoring.  Recommend if LDL continues to be low, to discontinue simvastatin use or at least decrease dose.  LDL goal less than 70 4. Due to weekly headaches, daytime fatigue, insomnia and RLS it was recommended to be evaluated by [facility] sleep clinic for potential underlying sleep disorder such as sleep apnea.  Discussion with patient and daughter regarding increased risk factors of stroke and heart disease along with potentially worsening dementia with untreated sleep apnea    Follow up in 6 months or call earlier if needed   Greater than 50% of time during  this 25 minute visit was spent on counseling, explanation of diagnosis of left ACA infarct, reviewing risk factor management of HTN, HLD and dementia, planning of further management along with potential future management, and discussion with patient and family answering all questions.    George HughJessica VanSchaick, AGNP-BC  [facility] [address] [place] [id number]  Phone [phone] Fax [phone] Note: This document was prepared with digital dictation and possible smart phrase technology. Any transcriptional errors that result from this process are unintentional.

## 2018-05-29 NOTE — Patient Instructions (Signed)
Continue aspirin 81 mg daily  and Zocor  for secondary stroke prevention  Continue to follow up with PCP regarding cholesterol and blood pressure management - obtain repeat lipid panel at next appt - call PCP to schedule appointment   Referral placed to [facility] - you will be called to schedule appointment   Continue to stay active and maintain a healthy diet  Continue to monitor blood pressure at home  Maintain strict control of hypertension with blood pressure goal below 130/90, diabetes with hemoglobin A1c goal below 6.5% and cholesterol with LDL cholesterol (bad cholesterol) goal below 70 mg/dL. I also advised the patient to eat a healthy diet with plenty of whole grains, cereals, fruits and vegetables, exercise regularly and maintain ideal body weight.  Followup in the future with me in 6 months or call earlier if needed       Thank you for coming to see [place] at [facility]. I hope we have been able to provide you high quality care today.  You may receive a patient satisfaction survey over the next few weeks. We would appreciate your feedback and comments so that we may continue to improve ourselves and the health of our patients.

## 2018-05-29 NOTE — Progress Notes (Signed)
I agree with the above plan 

## 2018-07-02 ENCOUNTER — Ambulatory Visit (INDEPENDENT_AMBULATORY_CARE_PROVIDER_SITE_OTHER): Payer: Medicare Other | Admitting: Orthopedic Surgery

## 2018-07-13 ENCOUNTER — Telehealth: Payer: Self-pay

## 2018-07-13 NOTE — Telephone Encounter (Signed)
Notes recorded by Hildred Alamin, RN on 07/13/2018 at 4:03 PM EDT I called pt that her cardiac event monitor was normal. It was negative for any irregular heart beat. Continue treatment plan. Pt verbalized understanding. ------

## 2018-11-27 ENCOUNTER — Ambulatory Visit: Payer: Medicare Other | Admitting: Adult Health

## 2019-12-01 DIAGNOSIS — E785 Hyperlipidemia, unspecified: Secondary | ICD-10-CM | POA: Diagnosis not present

## 2019-12-01 DIAGNOSIS — E538 Deficiency of other specified B group vitamins: Secondary | ICD-10-CM | POA: Diagnosis not present

## 2019-12-01 DIAGNOSIS — E039 Hypothyroidism, unspecified: Secondary | ICD-10-CM | POA: Diagnosis not present

## 2019-12-16 DIAGNOSIS — M545 Low back pain: Secondary | ICD-10-CM | POA: Diagnosis not present

## 2019-12-16 DIAGNOSIS — E785 Hyperlipidemia, unspecified: Secondary | ICD-10-CM | POA: Diagnosis not present

## 2019-12-16 DIAGNOSIS — E039 Hypothyroidism, unspecified: Secondary | ICD-10-CM | POA: Diagnosis not present

## 2019-12-16 DIAGNOSIS — M6740 Ganglion, unspecified site: Secondary | ICD-10-CM | POA: Diagnosis not present

## 2019-12-16 DIAGNOSIS — I10 Essential (primary) hypertension: Secondary | ICD-10-CM | POA: Diagnosis not present

## 2019-12-16 DIAGNOSIS — Z0000 Encounter for general adult medical examination without abnormal findings: Secondary | ICD-10-CM | POA: Diagnosis not present

## 2020-01-05 IMAGING — CT CT HEAD W/O CM
4 series · 15 of 47 positions shown, 17 images · non-contrast
Comparison: None.

CLINICAL DATA: Fell.  Lower extremity numbness.

EXAM:
CT HEAD WITHOUT CONTRAST
CT CERVICAL SPINE WITHOUT CONTRAST
TECHNIQUE: Multidetector CT imaging of the head and cervical spine was
performed following the standard protocol without intravenous
contrast. Multiplanar CT image reconstructions of the cervical spine
were also generated.

[Series 3: head without · axial · non-contrast · 0.40mm/px · z∈[-93,+27]mm · 7 of 34 slices shown, 9 images]
[im 5/34  brain]
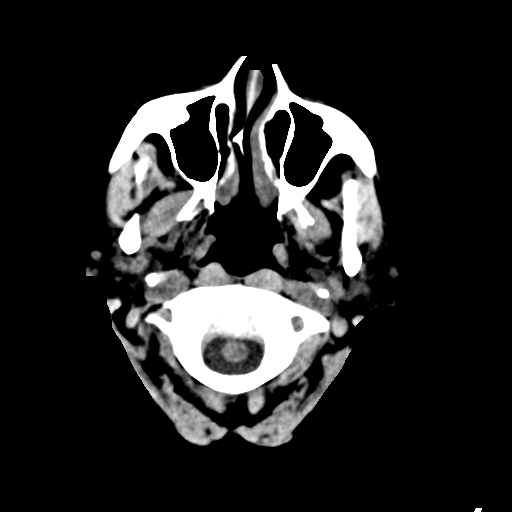
[im 5/34  bone]
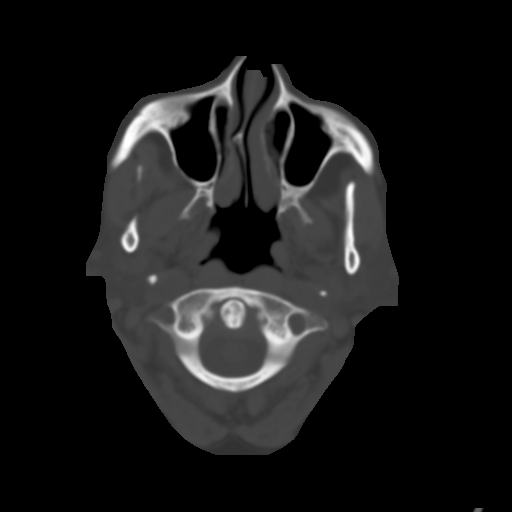
[im 9/34  brain]
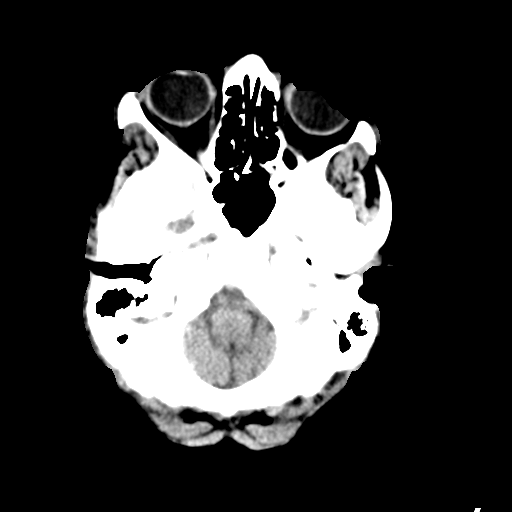
[im 13/34  brain]
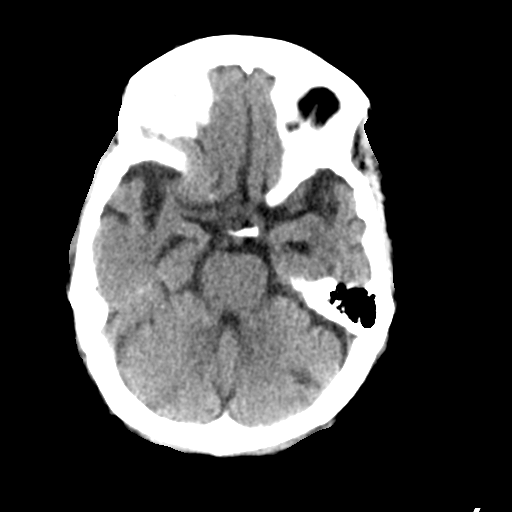
[im 17/34  brain]
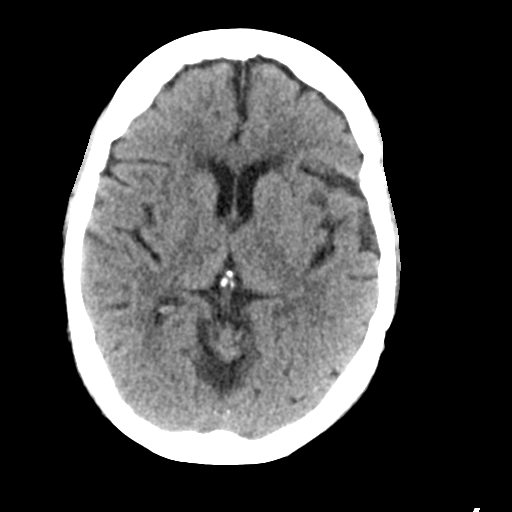
[im 21/34  brain]
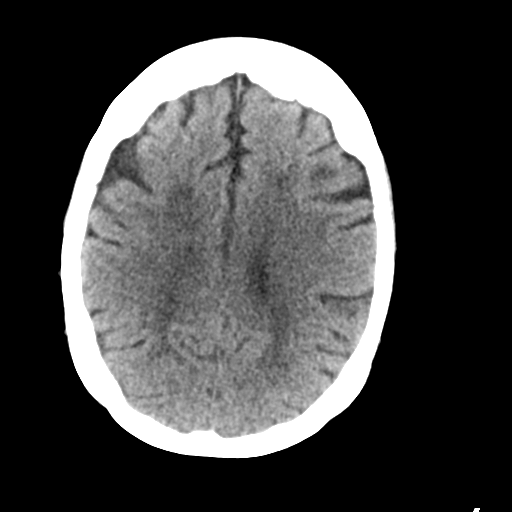
[im 21/34  bone]
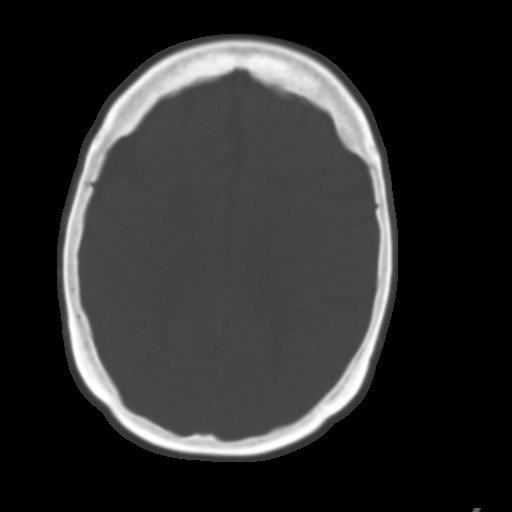
[im 25/34  brain]
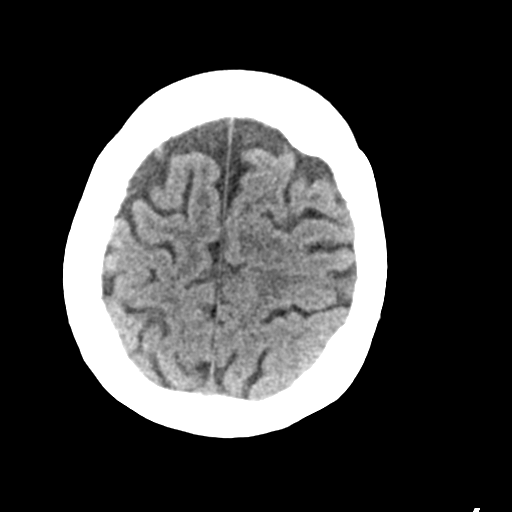
[im 29/34  brain]
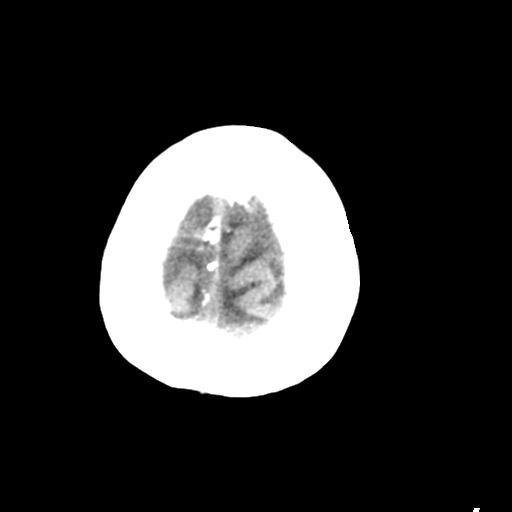

[Series 4: head bone · axial · 0.40mm/px · z∈[-97,-81]mm · 2 of 84 slices shown]
[im 9/84  bone]
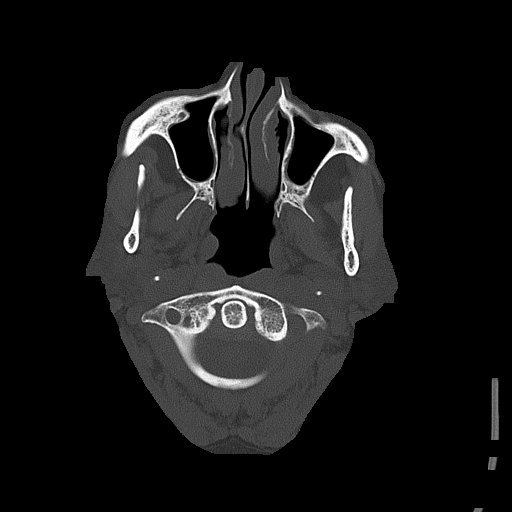
[im 17/84  bone]
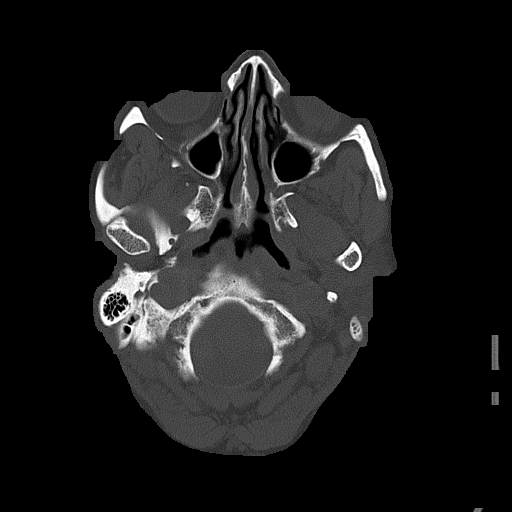

[Series 5: head without cor · coronal · non-contrast · 0.42mm/px · 3 of 67 slices shown]
[im 23/67  brain]
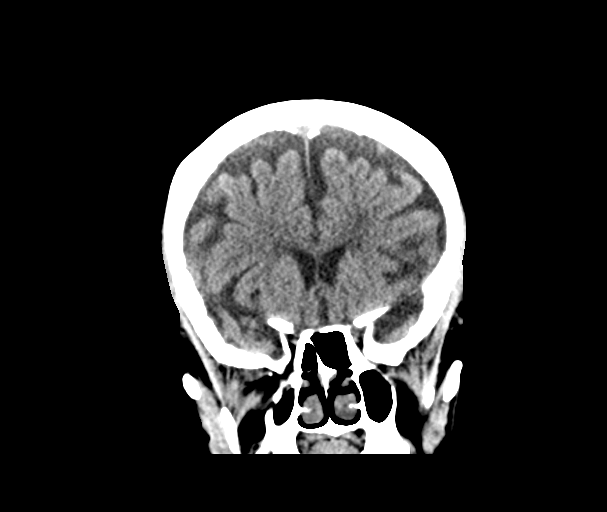
[im 30/67  brain]
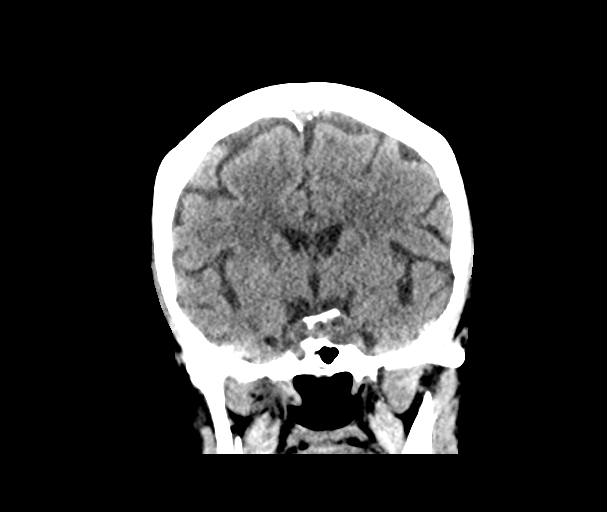
[im 37/67  brain]
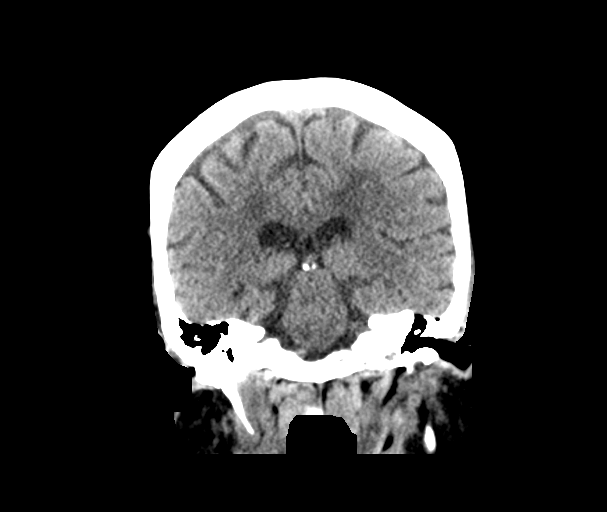

[Series 6: head without sag · sagittal · non-contrast · 0.40mm/px · 3 of 63 slices shown]
[im 21/63  brain]
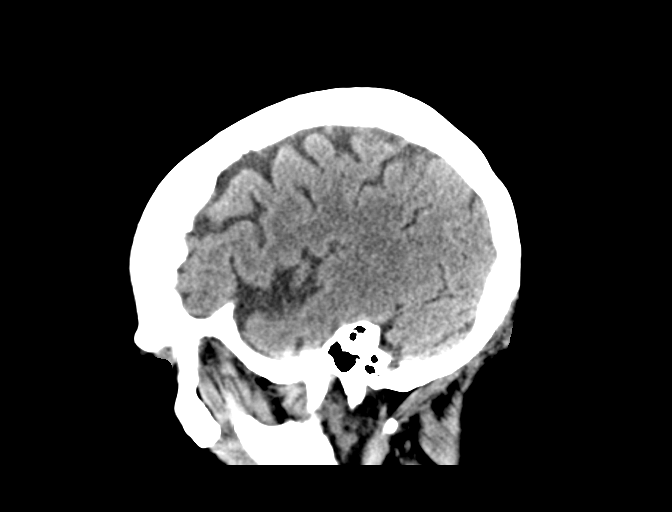
[im 32/63  brain]
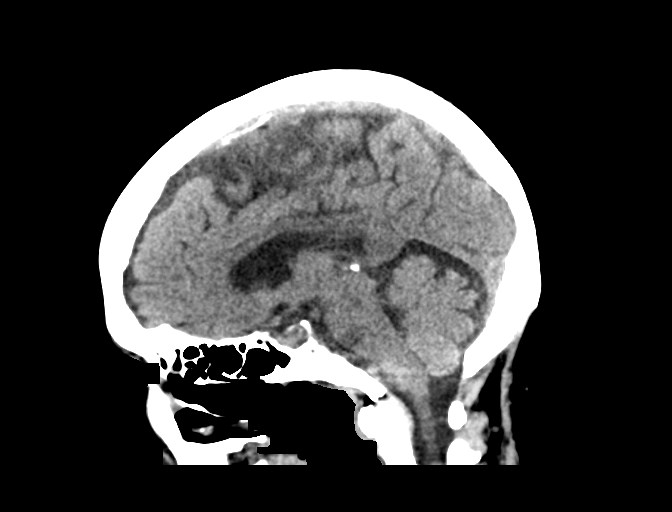
[im 42/63  brain]
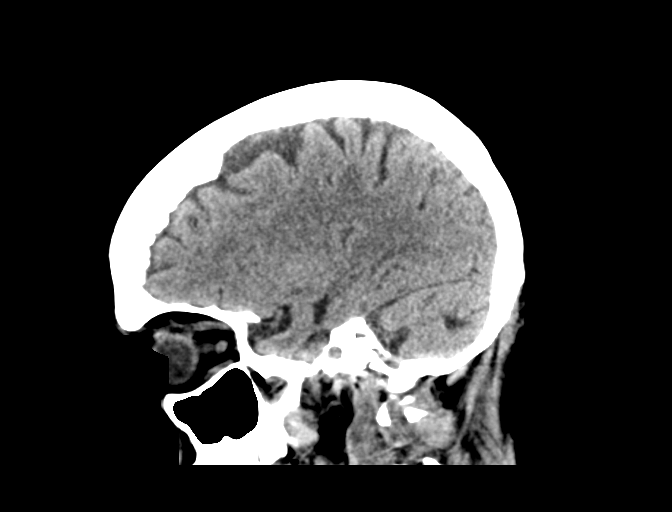

[15 of 47 positions shown; findings below may reference images not displayed]

FINDINGS: CT HEAD FINDINGS

Brain: Age related cerebral atrophy, ventriculomegaly and
periventricular white matter disease. No extra-axial fluid
collections are identified. No CT findings for acute hemispheric
infarction or intracranial hemorrhage. No mass lesions. The
brainstem and cerebellum are normal.

Vascular: Minimal vascular calcifications. No obvious aneurysm or
hyperdense vessels.

Skull: No skull fracture or bone lesion. Mild hyperostosis frontalis
interna.

Sinuses/Orbits: The paranasal sinuses and mastoid air cells are
clear. The globes are intact.

Other: No scalp lesions or hematoma.

CT CERVICAL SPINE FINDINGS

Alignment: Normal overall alignment. Mild degenerative cervical
spondylosis with multilevel disc disease and facet disease.

Skull base and vertebrae: The skull base C1 and C1-2 articulations
are maintained. Moderate degenerative changes at C1-2. No dens
fracture. No vertebral body, facet or laminar fractures.

Soft tissues and spinal canal: No prevertebral fluid or swelling. No
visible canal hematoma.

Disc levels: Generous spinal canal without findings for spinal or
foraminal stenosis. No large disc protrusions.

Upper chest: The lung apices are grossly clear.

Other: No neck mass or lymphadenopathy.
IMPRESSION: 1. Age related cerebral atrophy, ventriculomegaly and
periventricular white matter disease.
2. No acute intracranial findings or skull fracture.
3. Normal alignment of the cervical vertebral bodies and no acute
cervical spine fracture. Generous spinal without obvious large disc
protrusion or canal compromise.

## 2020-12-22 DIAGNOSIS — I10 Essential (primary) hypertension: Secondary | ICD-10-CM | POA: Diagnosis not present

## 2020-12-22 DIAGNOSIS — E785 Hyperlipidemia, unspecified: Secondary | ICD-10-CM | POA: Diagnosis not present

## 2020-12-22 DIAGNOSIS — E039 Hypothyroidism, unspecified: Secondary | ICD-10-CM | POA: Diagnosis not present

## 2020-12-28 DIAGNOSIS — E538 Deficiency of other specified B group vitamins: Secondary | ICD-10-CM | POA: Diagnosis not present

## 2020-12-28 DIAGNOSIS — I10 Essential (primary) hypertension: Secondary | ICD-10-CM | POA: Diagnosis not present

## 2020-12-28 DIAGNOSIS — Z1339 Encounter for screening examination for other mental health and behavioral disorders: Secondary | ICD-10-CM | POA: Diagnosis not present

## 2020-12-28 DIAGNOSIS — G629 Polyneuropathy, unspecified: Secondary | ICD-10-CM | POA: Diagnosis not present

## 2020-12-28 DIAGNOSIS — E039 Hypothyroidism, unspecified: Secondary | ICD-10-CM | POA: Diagnosis not present

## 2020-12-28 DIAGNOSIS — E785 Hyperlipidemia, unspecified: Secondary | ICD-10-CM | POA: Diagnosis not present

## 2020-12-28 DIAGNOSIS — R69 Illness, unspecified: Secondary | ICD-10-CM | POA: Diagnosis not present

## 2020-12-28 DIAGNOSIS — Z0000 Encounter for general adult medical examination without abnormal findings: Secondary | ICD-10-CM | POA: Diagnosis not present

## 2021-03-02 DIAGNOSIS — E785 Hyperlipidemia, unspecified: Secondary | ICD-10-CM | POA: Diagnosis not present

## 2021-03-02 DIAGNOSIS — I10 Essential (primary) hypertension: Secondary | ICD-10-CM | POA: Diagnosis not present

## 2021-03-02 DIAGNOSIS — E039 Hypothyroidism, unspecified: Secondary | ICD-10-CM | POA: Diagnosis not present

## 2021-03-02 DIAGNOSIS — Z23 Encounter for immunization: Secondary | ICD-10-CM | POA: Diagnosis not present

## 2021-04-28 ENCOUNTER — Encounter (HOSPITAL_COMMUNITY): Payer: Self-pay | Admitting: Emergency Medicine

## 2021-04-28 ENCOUNTER — Emergency Department (HOSPITAL_COMMUNITY)
Admission: EM | Admit: 2021-04-28 | Discharge: 2021-04-28 | Disposition: A | Discharge status: Home / Self Care | Payer: Medicare HMO | Attending: Emergency Medicine | Admitting: Emergency Medicine

## 2021-04-28 ENCOUNTER — Other Ambulatory Visit: Payer: Self-pay

## 2021-04-28 ENCOUNTER — Ambulatory Visit (HOSPITAL_COMMUNITY)
Admission: EM | Admit: 2021-04-28 | Discharge: 2021-04-28 | Disposition: A | Discharge status: Home / Self Care | Payer: Medicare HMO

## 2021-04-28 ENCOUNTER — Emergency Department (HOSPITAL_COMMUNITY): Payer: Medicare HMO

## 2021-04-28 ENCOUNTER — Encounter (HOSPITAL_COMMUNITY): Payer: Self-pay

## 2021-04-28 DIAGNOSIS — Z23 Encounter for immunization: Secondary | ICD-10-CM | POA: Insufficient documentation

## 2021-04-28 DIAGNOSIS — R69 Illness, unspecified: Secondary | ICD-10-CM | POA: Diagnosis not present

## 2021-04-28 DIAGNOSIS — I10 Essential (primary) hypertension: Secondary | ICD-10-CM | POA: Insufficient documentation

## 2021-04-28 DIAGNOSIS — W1839XA Other fall on same level, initial encounter: Secondary | ICD-10-CM | POA: Insufficient documentation

## 2021-04-28 DIAGNOSIS — W19XXXA Unspecified fall, initial encounter: Secondary | ICD-10-CM

## 2021-04-28 DIAGNOSIS — Z79899 Other long term (current) drug therapy: Secondary | ICD-10-CM | POA: Insufficient documentation

## 2021-04-28 DIAGNOSIS — Z91040 Latex allergy status: Secondary | ICD-10-CM | POA: Diagnosis not present

## 2021-04-28 DIAGNOSIS — S01511A Laceration without foreign body of lip, initial encounter: Secondary | ICD-10-CM | POA: Diagnosis not present

## 2021-04-28 DIAGNOSIS — Z7982 Long term (current) use of aspirin: Secondary | ICD-10-CM | POA: Insufficient documentation

## 2021-04-28 DIAGNOSIS — S0993XA Unspecified injury of face, initial encounter: Secondary | ICD-10-CM | POA: Diagnosis not present

## 2021-04-28 DIAGNOSIS — F0390 Unspecified dementia without behavioral disturbance: Secondary | ICD-10-CM | POA: Diagnosis not present

## 2021-04-28 DIAGNOSIS — S01111A Laceration without foreign body of right eyelid and periocular area, initial encounter: Secondary | ICD-10-CM | POA: Diagnosis not present

## 2021-04-28 DIAGNOSIS — S0990XA Unspecified injury of head, initial encounter: Secondary | ICD-10-CM

## 2021-04-28 DIAGNOSIS — Z043 Encounter for examination and observation following other accident: Secondary | ICD-10-CM | POA: Diagnosis not present

## 2021-04-28 MED ORDER — TETANUS-DIPHTH-ACELL PERTUSSIS 5-2.5-18.5 LF-MCG/0.5 IM SUSY
0.5000 mL | PREFILLED_SYRINGE | Freq: Once | INTRAMUSCULAR | Status: AC
  Administered 2021-04-28: 0.5 mL via INTRAMUSCULAR
  Filled 2021-04-28: qty 0.5

## 2021-04-28 MED ORDER — LIDOCAINE-EPINEPHRINE (PF) 2 %-1:200000 IJ SOLN
10.0000 mL | Freq: Once | INTRAMUSCULAR | Status: AC
  Administered 2021-04-28: 10 mL via INTRADERMAL
  Filled 2021-04-28: qty 20

## 2021-04-28 NOTE — ED Provider Triage Note (Signed)
Emergency Medicine Provider Triage Evaluation Note  Kristen Parks , a 81 y.o. female  was evaluated in triage.  Pt complains of a fall earlier this afternoon.  Patient brought in by her daughter who believes that she fell at around noon this afternoon, because this is the time she normally goes outside to check the mailbox.  Patient is on aspirin, no blood thinners. No LOC. Patient has history of dementia. .  Review of Systems  Positive: Head trauma, facial wounds Negative: Weakness, LOC  Physical Exam  BP 131/85 (BP Location: Left Arm)    Pulse 93    Resp 18    SpO2 98%  Gen:   Awake, no distress   Resp:  Normal effort  MSK:   Moves extremities without difficulty  Other:  Laceration above right eyebrow, laceration above right upper lip that will require sutures (does not cross vermilion border, does not appear to communicate with inner lip during brief evaluation)  Can follow basic commands, PERRLA, 5/5 strength in all extremities  Medical Decision Making  Medically screening exam initiated at 5:16 PM.  Appropriate orders placed.  Kristen Parks was informed that the remainder of the evaluation will be completed by another provider, this initial triage assessment does not replace that evaluation, and the importance of remaining in the ED until their evaluation is complete.  Will obtain CT head imaging to r/o brain bleed   Kearney Evitt T, PA-C 04/28/21 1719

## 2021-04-28 NOTE — ED Provider Notes (Signed)
[facility] EMERGENCY DEPARTMENT Provider Note   CSN: [id number] Arrival date & time: 04/28/21  1657     History  Chief Complaint  Patient presents with   Kristen Parks    Kristen Parks is a 81 y.o. female with past medical history significant for HTN, dementia, HLD, prior CVA who presents following a fall earlier today.  The patient lives with her daughter who provides most the history due to the patient's dementia.  [name] states that she came home to find her mother sitting outside on the porch with multiple lacerations on her face and 2 black eyes.  She says that her mother typically walks out to get the mail around noon or 1230 and she thinks this is when she fell.  The patient denies loss of consciousness but is unable to provide many details about the accident.  She is currently complaining of pain above her right eye and on the right side of her mouth.  She denies dizziness, vision changes, chest pain, shortness of breath, or abdominal pain.     Home Medications Prior to Admission medications   Medication Sig Start Date End Date Taking? Authorizing Provider  aspirin EC 81 MG EC tablet Take 1 tablet (81 mg total) by mouth daily. 04/17/18   Elgergawy, Silver Huguenin, MD  feeding supplement, ENSURE ENLIVE, (ENSURE ENLIVE) LIQD Take 237 mLs by mouth 2 (two) times daily between meals. 04/16/18   Elgergawy, Silver Huguenin, MD  ferrous sulfate 325 (65 FE) MG EC tablet Take 1 tablet (325 mg total) by mouth 2 (two) times daily. 04/16/18 04/16/19  Elgergawy, Silver Huguenin, MD  gabapentin (NEURONTIN) 300 MG capsule Take 300 mg by mouth at bedtime.     [provider]  hydrOXYzine (ATARAX/VISTARIL) 25 MG tablet Take 25 mg by mouth at bedtime as needed for itching.    [provider]  levothyroxine (SYNTHROID, LEVOTHROID) 112 MCG tablet Take 112 mcg by mouth daily.     [provider]  Multiple Vitamin (MULTIVITAMIN WITH MINERALS) TABS tablet Take 1 tablet by mouth  daily. 04/16/18   Elgergawy, Silver Huguenin, MD  omeprazole (PRILOSEC) 40 MG capsule Take 40 mg by mouth daily.    [provider]  simvastatin (ZOCOR) 20 MG tablet Take 20 mg by mouth at bedtime.     [provider]  vitamin B-12 (CYANOCOBALAMIN) 500 MCG tablet Take 1 tablet (500 mcg total) by mouth daily. 04/16/18   Elgergawy, Silver Huguenin, MD      Allergies    Latex, Penicillins, and Sulfa antibiotics    Review of Systems   Review of Systems  Eyes:  Negative for visual disturbance.  Respiratory:  Negative for cough and shortness of breath.   Cardiovascular:  Negative for chest pain.  Gastrointestinal:  Negative for abdominal pain, nausea and vomiting.  Skin:  Positive for wound.  Neurological:  Positive for headaches. Negative for dizziness, light-headedness and numbness.   Physical Exam Updated Vital Signs BP 136/88    Pulse 89    Temp 98.2 F (36.8 C) (Oral)    Resp 16    Ht 5\' 2"  (1.575 m)    Wt 58.5 kg    SpO2 99%    BMI 23.59 kg/m  Physical Exam Vitals and nursing note reviewed.  Constitutional:      General: She is not in acute distress.    Appearance: She is well-developed.  HENT:     Head: Normocephalic.     Comments:  Bilateral periorbital ecchymoses, no pain with extraocular movements.  Hemostatic lacerations over the right brow and right lip as pictured below.    Right Ear: External ear normal.     Left Ear: External ear normal.     Nose: Nose normal.     Mouth/Throat:     Pharynx: Oropharynx is clear.  Eyes:     Extraocular Movements: Extraocular movements intact.     Conjunctiva/sclera: Conjunctivae normal.     Pupils: Pupils are equal, round, and reactive to light.  Cardiovascular:     Rate and Rhythm: Normal rate and regular rhythm.     Pulses: Normal pulses.     Heart sounds: Normal heart sounds. No murmur heard. Pulmonary:     Effort: Pulmonary effort is normal. No respiratory distress.     Breath sounds: Normal breath sounds.  Abdominal:      Palpations: Abdomen is soft.     Tenderness: There is no abdominal tenderness.  Musculoskeletal:        General: No swelling.     Cervical back: Neck supple.  Skin:    General: Skin is warm and dry.     Capillary Refill: Capillary refill takes less than 2 seconds.  Neurological:     General: No focal deficit present.     Mental Status: She is alert. Mental status is at baseline.  Psychiatric:        Mood and Affect: Mood normal.      ED Results / Procedures / Treatments   Labs (all labs ordered are listed, but only abnormal results are displayed) Labs Reviewed - No data to display  EKG None  Radiology CT Head Wo Contrast  Result Date: 04/28/2021 CLINICAL DATA:  Reports a fall today at home on [address]. She states her lip and tooth are hurting EXAM: CT HEAD WITHOUT CONTRAST CT CERVICAL SPINE WITHOUT CONTRAST TECHNIQUE: Multidetector CT imaging of the head and cervical spine was performed following the standard protocol without intravenous contrast. Multiplanar CT image reconstructions of the cervical spine were also generated. RADIATION DOSE REDUCTION: This exam was performed according to the departmental dose-optimization program which includes automated exposure control, adjustment of the mA and/or kV according to patient size and/or use of iterative reconstruction technique. COMPARISON:  04/14/2018 FINDINGS: CT HEAD FINDINGS Brain: No evidence of acute infarction, hemorrhage, hydrocephalus, extra-axial collection or mass lesion/mass effect. Patchy areas of white matter hypoattenuation are noted consistent with mild chronic microvascular ischemic change. Small focus fluid attenuation in the left cerebellum suggesting old infarct, unchanged. Vascular: No hyperdense vessel or unexpected calcification. Skull: Normal. Negative for fracture or focal lesion. Sinuses/Orbits: Visualized globes and orbits are unremarkable. Visualized sinuses are clear. Other: None. CT CERVICAL SPINE  FINDINGS Alignment: Mild reversal of the normal cervical lordosis centered at C2-C3, likely due to neck flexion. No spondylolisthesis. Skull base and vertebrae: No acute fracture. No primary bone lesion or focal pathologic process. Soft tissues and spinal canal: No prevertebral fluid or swelling. No visible canal hematoma. Disc levels: Mild loss of disc height at C3-C4, C4-C5 and C5-C6 with mild disc bulging and endplate spurring. No convincing disc herniation and no change from the prior CT. Upper chest: No acute or significant abnormalities. Other: None. IMPRESSION: HEAD CT 1. No acute intracranial abnormalities. CERVICAL CT 1. No fracture or acute finding. Electronically Signed   By: Lajean Manes M.D.   On: 04/28/2021 18:36   CT Cervical Spine Wo Contrast  Result Date: 04/28/2021 CLINICAL DATA:  Reports  a fall today at home on [address]. She states her lip and tooth are hurting EXAM: CT HEAD WITHOUT CONTRAST CT CERVICAL SPINE WITHOUT CONTRAST TECHNIQUE: Multidetector CT imaging of the head and cervical spine was performed following the standard protocol without intravenous contrast. Multiplanar CT image reconstructions of the cervical spine were also generated. RADIATION DOSE REDUCTION: This exam was performed according to the departmental dose-optimization program which includes automated exposure control, adjustment of the mA and/or kV according to patient size and/or use of iterative reconstruction technique. COMPARISON:  04/14/2018 FINDINGS: CT HEAD FINDINGS Brain: No evidence of acute infarction, hemorrhage, hydrocephalus, extra-axial collection or mass lesion/mass effect. Patchy areas of white matter hypoattenuation are noted consistent with mild chronic microvascular ischemic change. Small focus fluid attenuation in the left cerebellum suggesting old infarct, unchanged. Vascular: No hyperdense vessel or unexpected calcification. Skull: Normal. Negative for fracture or focal lesion.  Sinuses/Orbits: Visualized globes and orbits are unremarkable. Visualized sinuses are clear. Other: None. CT CERVICAL SPINE FINDINGS Alignment: Mild reversal of the normal cervical lordosis centered at C2-C3, likely due to neck flexion. No spondylolisthesis. Skull base and vertebrae: No acute fracture. No primary bone lesion or focal pathologic process. Soft tissues and spinal canal: No prevertebral fluid or swelling. No visible canal hematoma. Disc levels: Mild loss of disc height at C3-C4, C4-C5 and C5-C6 with mild disc bulging and endplate spurring. No convincing disc herniation and no change from the prior CT. Upper chest: No acute or significant abnormalities. Other: None. IMPRESSION: HEAD CT 1. No acute intracranial abnormalities. CERVICAL CT 1. No fracture or acute finding. Electronically Signed   By: Lajean Manes M.D.   On: 04/28/2021 18:36    Procedures .Marland KitchenLaceration Repair  Date/Time: 04/28/2021 9:32 PM Performed by: Varney Baas, MD Authorized by: Luna Fuse, MD   Consent:    Consent obtained:  Verbal   Consent given by:  Patient   Risks, benefits, and alternatives were discussed: yes     Risks discussed:  Infection, pain, poor cosmetic result and poor wound healing Universal protocol:    Procedure explained and questions answered to patient or proxy's satisfaction: yes     Patient identity confirmed:  Verbally with patient Anesthesia:    Anesthesia method:  Local infiltration   Local anesthetic:  Lidocaine 2% WITH epi Laceration details:    Location:  Face   Face location:  R eyebrow   Length (cm):  3 Pre-procedure details:    Preparation:  Patient was prepped and draped in usual sterile fashion Exploration:    Imaging outcome: foreign body not noted     Wound exploration: entire depth of wound visualized     Wound extent: no foreign bodies/material noted and no vascular damage noted     Contaminated: no   Treatment:    Area cleansed with:  Chlorhexidine   Amount of  cleaning:  Standard   Irrigation solution:  Sterile saline   Irrigation method:  Syringe   Visualized foreign bodies/material removed: no     Debridement:  None Skin repair:    Repair method:  Sutures   Suture size:  6-0   Wound skin closure material used: Ethilon.   Suture technique:  Simple interrupted   Number of sutures:  2 Approximation:    Approximation:  Close Repair type:    Repair type:  Simple Post-procedure details:    Dressing:  Open (no dressing)   Procedure completion:  Tolerated well, no immediate complications .Marland KitchenLaceration Repair  Date/Time:  04/28/2021 9:34 PM Performed by: Varney Baas, MD Authorized by: Luna Fuse, MD   Consent:    Consent obtained:  Verbal   Consent given by:  Patient   Risks, benefits, and alternatives were discussed: yes     Risks discussed:  Infection, pain, poor cosmetic result and poor wound healing Universal protocol:    Procedure explained and questions answered to patient or proxy's satisfaction: yes     Patient identity confirmed:  Verbally with patient Anesthesia:    Anesthesia method:  Local infiltration   Local anesthetic:  Lidocaine 2% WITH epi Laceration details:    Location:  Lip   Lip location:  Upper exterior lip   Length (cm):  2 Pre-procedure details:    Preparation:  Patient was prepped and draped in usual sterile fashion Exploration:    Imaging outcome: foreign body not noted     Wound exploration: wound explored through full range of motion and entire depth of wound visualized     Wound extent: no foreign bodies/material noted     Contaminated: no   Treatment:    Area cleansed with:  Chlorhexidine   Irrigation solution:  Sterile saline   Irrigation method:  Syringe   Visualized foreign bodies/material removed: no     Debridement:  None Skin repair:    Repair method:  Sutures   Suture size:  6-0   Wound skin closure material used: ethilon.   Suture technique:  Simple interrupted   Number of  sutures:  3 Approximation:    Approximation:  Close Repair type:    Repair type:  Simple Post-procedure details:    Dressing:  Open (no dressing)   Procedure completion:  Tolerated well, no immediate complications   Medications Ordered in ED Medications  lidocaine-EPINEPHrine (XYLOCAINE W/EPI) 2 %-1:200000 (PF) injection 10 mL (10 mLs Intradermal Given by Other 04/28/21 1904)  Tdap (BOOSTRIX) injection 0.5 mL (0.5 mLs Intramuscular Given 04/28/21 2016)    ED Course/ Medical Decision Making/ A&P                            Presents after ground-level mechanical fall as described in HPI above.  Physical exam significant for periorbital ecchymoses and 2 lacerations as documented in physical exam section above.  Patient has no other obvious injuries or tenderness on exam. Patient is at her neurologic baseline according to her daughter at bedside. CT head and CT C-spine reviewed by myself negative for any acute traumatic injuries or malalignment.  I do not believe any additional imaging is warranted based on physical exam. Patient's tetanus was updated.  I discussed the results of the CT scans with the patient's daughter at bedside and explained the need to repair the lacerations.  Lacerations were repaired according to the procedure notes above.  Patient tolerated procedure well.  I instructed the patient's daughter to present to any urgent care, ED, or PCP to have the wound checked and sutures removed in 5 days.  Final Clinical Impression(s) / ED Diagnoses Final diagnoses:  Fall, initial encounter    Rx / DC Orders ED Discharge Orders     None         Kalese Ensz, Amalia Hailey, MD 04/28/21 2144    Luna Fuse, MD 04/29/21 2219

## 2021-04-28 NOTE — ED Provider Notes (Signed)
[facility]    CSN: [id number] Arrival date & time: 04/28/21  1455      History   Chief Complaint Chief Complaint  Patient presents with   Fall    HPI Kristen Parks is a 81 y.o. female presenting following head trauma that occurred earlier today.  Medical history of CVA, dementia, arthritis, high fall risk.  Here today with daughter.  Fall was not witnessed, and patient is not a reliable historian.  Daughter thinks that patient fell forward while walking on brick walkway, landing face first on a porch.  She now has 2 black eyes, multiple lacerations, headache.  Unsure of LOC, but patient cannot recall the accident.  Denies current dizziness, vision changes, unilateral weakness.  HPI  Past Medical History:  Diagnosis Date   Acquired hypothyroidism    Arthritis    "RLE" (04/15/2018)   CVA (cerebral vascular accident) ([place]) 04/14/2018   "don't feel any different and daughter doesn't notice any changes" (04/15/2018)   Dementia ([place])    Dyslipidemia    Essential hypertension    Headache    "q couple months" (04/15/2018)   History of blood transfusion 04/14/2018   "low counts" (04/15/2018)   Migraine    "q couple months" (04/15/2018)   Peripheral neuropathy     Patient Active Problem List   Diagnosis Date Noted   Female bladder prolapse 05/01/2018   Symptomatic anemia 04/14/2018   CVA (cerebral vascular accident) ([place]) 04/14/2018   Essential hypertension    Dyslipidemia    Dementia ([place])    Acquired hypothyroidism     Past Surgical History:  Procedure Laterality Date   CARPAL TUNNEL RELEASE Left    CATARACT EXTRACTION, BILATERAL Bilateral    LAPAROSCOPIC CHOLECYSTECTOMY      OB History     Gravida  6   Para  5   Term      Preterm      AB      Living  5      SAB      IAB      Ectopic      Multiple      Live Births               Home Medications    Prior to Admission medications   Medication Sig Start Date End Date Taking?  Authorizing Provider  aspirin EC 81 MG EC tablet Take 1 tablet (81 mg total) by mouth daily. 04/17/18   Elgergawy, Silver Huguenin, MD  feeding supplement, ENSURE ENLIVE, (ENSURE ENLIVE) LIQD Take 237 mLs by mouth 2 (two) times daily between meals. 04/16/18   Elgergawy, Silver Huguenin, MD  ferrous sulfate 325 (65 FE) MG EC tablet Take 1 tablet (325 mg total) by mouth 2 (two) times daily. 04/16/18 04/16/19  Elgergawy, Silver Huguenin, MD  gabapentin (NEURONTIN) 300 MG capsule Take 300 mg by mouth at bedtime.     [provider]  hydrOXYzine (ATARAX/VISTARIL) 25 MG tablet Take 25 mg by mouth at bedtime as needed for itching.    [provider]  levothyroxine (SYNTHROID, LEVOTHROID) 112 MCG tablet Take 112 mcg by mouth daily.     [provider]  Multiple Vitamin (MULTIVITAMIN WITH MINERALS) TABS tablet Take 1 tablet by mouth daily. 04/16/18   Elgergawy, Silver Huguenin, MD  omeprazole (PRILOSEC) 40 MG capsule Take 40 mg by mouth daily.    [provider]  simvastatin (ZOCOR) 20 MG tablet Take 20 mg by mouth at bedtime.  [provider]  vitamin B-12 (CYANOCOBALAMIN) 500 MCG tablet Take 1 tablet (500 mcg total) by mouth daily. 04/16/18   Elgergawy, Silver Huguenin, MD    Family History History reviewed. No pertinent family history.  Social History Social History   Tobacco Use   Smoking status: Former    Packs/day: 0.50    Years: 20.00    Pack years: 10.00    Types: Cigarettes    Quit date: 1968    Years since quitting: 55.0   Smokeless tobacco: Never  Vaping Use   Vaping Use: Never used  Substance Use Topics   Alcohol use: No    Comment: 04/15/2018 "used to have a drink once/month; nothing since <1980s"   Drug use: No     Allergies   Latex, Penicillins, and Sulfa antibiotics   Review of Systems Review of Systems  Skin:  Positive for wound.  All other systems reviewed and are negative.   Physical Exam Triage Vital Signs ED Triage Vitals [04/28/21 1602]  Enc Vitals Group      BP 124/77     Pulse Rate 95     Resp 16     Temp 98.2 F (36.8 C)     Temp Source Oral     SpO2 100 %     Weight      Height      Head Circumference      Peak Flow      Pain Score 6     Pain Loc      Pain Edu?      Excl. in [place]?    No data found.  Updated Vital Signs BP 124/77 (BP Location: Left Arm)    Pulse 95    Temp 98.2 F (36.8 C) (Oral)    Resp 16    SpO2 100%   Visual Acuity Right Eye Distance:   Left Eye Distance:   Bilateral Distance:    Right Eye Near:   Left Eye Near:    Bilateral Near:     Physical Exam Vitals reviewed.  Constitutional:      General: She is not in acute distress.    Appearance: Normal appearance. She is not ill-appearing.  HENT:     Head: Normocephalic and atraumatic.     Comments: R upper orbital tenderness surrounding laceration. No pain with EOMIs.  Pulmonary:     Effort: Pulmonary effort is normal.  Skin:    Comments: See images below Multiple lacerations and ecchymosis   Neurological:     General: No focal deficit present.     Mental Status: She is alert and oriented to person, place, and time.  Psychiatric:        Mood and Affect: Mood normal.        Behavior: Behavior normal.        Thought Content: Thought content normal.        Judgment: Judgment normal.        UC Treatments / Results  Labs (all labs ordered are listed, but only abnormal results are displayed) Labs Reviewed - No data to display  EKG   Radiology No results found.  Procedures Procedures (including critical care time)  Medications Ordered in UC Medications - No data to display  Initial Impression / Assessment and Plan / UC Course  I have reviewed the triage vital signs and the nursing notes.  Pertinent labs & imaging results that were available during my care of the patient were reviewed by me and  considered in my medical decision making (see chart for details).     This patient is a very pleasant 81 y.o. year old female  presenting with head trauma following fall. There is orbital tenderness and headaches. Fall was unwitnessed, and patient with dementia, so unsure of LOC. Sent to ED via personal vehicle, daughter is in agreement.   Final Clinical Impressions(s) / UC Diagnoses   Final diagnoses:  Traumatic injury of head, initial encounter  Fall, initial encounter     Discharge Instructions      -Please head straight to the emergency department for further work-up, I think that you need a head CT to rule out fracture of your skull, or bleed inside the brain.  They will also need to suture the laceration in your lip.   ED Prescriptions   None    PDMP not reviewed this encounter.   Hazel Sams, PA-C 04/28/21 1617

## 2021-04-28 NOTE — ED Triage Notes (Signed)
Reports a fall today at home on brick walk way. She states her lip and tooth are hurting.

## 2021-04-28 NOTE — Discharge Instructions (Addendum)
-  Please head straight to the emergency department for further work-up, I think that you need a head CT to rule out fracture of your skull, or bleed inside the brain.  They will also need to suture the laceration in your lip.

## 2021-04-28 NOTE — ED Notes (Signed)
ED Provider at bedside. 

## 2021-04-28 NOTE — Discharge Instructions (Signed)
Follow-up at any urgent care, ED, or your primary care provider to have your stitches removed and your wound checked in 5 days.

## 2021-04-28 NOTE — ED Triage Notes (Signed)
Pt sent from urgent care, family reports pt fell earlier today on her brick steps. Lac above the lip and right eye brow.

## 2021-05-03 DIAGNOSIS — S01111A Laceration without foreign body of right eyelid and periocular area, initial encounter: Secondary | ICD-10-CM | POA: Diagnosis not present

## 2021-05-03 DIAGNOSIS — Z4802 Encounter for removal of sutures: Secondary | ICD-10-CM | POA: Diagnosis not present

## 2021-05-03 DIAGNOSIS — S01511A Laceration without foreign body of lip, initial encounter: Secondary | ICD-10-CM | POA: Diagnosis not present

## 2021-05-03 DIAGNOSIS — W108XXA Fall (on) (from) other stairs and steps, initial encounter: Secondary | ICD-10-CM | POA: Diagnosis not present

## 2021-06-05 DIAGNOSIS — R634 Abnormal weight loss: Secondary | ICD-10-CM | POA: Diagnosis not present

## 2021-06-05 DIAGNOSIS — E039 Hypothyroidism, unspecified: Secondary | ICD-10-CM | POA: Diagnosis not present

## 2021-06-05 DIAGNOSIS — I10 Essential (primary) hypertension: Secondary | ICD-10-CM | POA: Diagnosis not present

## 2021-10-31 DIAGNOSIS — R9389 Abnormal findings on diagnostic imaging of other specified body structures: Secondary | ICD-10-CM | POA: Diagnosis not present

## 2021-10-31 DIAGNOSIS — R1030 Lower abdominal pain, unspecified: Secondary | ICD-10-CM | POA: Diagnosis not present

## 2021-10-31 DIAGNOSIS — R8281 Pyuria: Secondary | ICD-10-CM | POA: Diagnosis not present

## 2021-10-31 DIAGNOSIS — K5909 Other constipation: Secondary | ICD-10-CM | POA: Diagnosis not present

## 2021-10-31 DIAGNOSIS — N390 Urinary tract infection, site not specified: Secondary | ICD-10-CM | POA: Diagnosis not present

## 2022-02-11 DIAGNOSIS — I10 Essential (primary) hypertension: Secondary | ICD-10-CM | POA: Diagnosis not present

## 2022-02-11 DIAGNOSIS — E039 Hypothyroidism, unspecified: Secondary | ICD-10-CM | POA: Diagnosis not present

## 2022-02-11 DIAGNOSIS — R7989 Other specified abnormal findings of blood chemistry: Secondary | ICD-10-CM | POA: Diagnosis not present

## 2022-02-11 DIAGNOSIS — E785 Hyperlipidemia, unspecified: Secondary | ICD-10-CM | POA: Diagnosis not present

## 2022-02-11 DIAGNOSIS — E538 Deficiency of other specified B group vitamins: Secondary | ICD-10-CM | POA: Diagnosis not present

## 2022-02-18 DIAGNOSIS — M25561 Pain in right knee: Secondary | ICD-10-CM | POA: Diagnosis not present

## 2022-02-18 DIAGNOSIS — R748 Abnormal levels of other serum enzymes: Secondary | ICD-10-CM | POA: Diagnosis not present

## 2022-02-18 DIAGNOSIS — Z0000 Encounter for general adult medical examination without abnormal findings: Secondary | ICD-10-CM | POA: Diagnosis not present

## 2022-02-18 DIAGNOSIS — R6889 Other general symptoms and signs: Secondary | ICD-10-CM | POA: Diagnosis not present

## 2022-02-18 DIAGNOSIS — R82998 Other abnormal findings in urine: Secondary | ICD-10-CM | POA: Diagnosis not present

## 2022-02-18 DIAGNOSIS — I10 Essential (primary) hypertension: Secondary | ICD-10-CM | POA: Diagnosis not present

## 2022-02-18 DIAGNOSIS — Z23 Encounter for immunization: Secondary | ICD-10-CM | POA: Diagnosis not present

## 2022-02-18 DIAGNOSIS — E039 Hypothyroidism, unspecified: Secondary | ICD-10-CM | POA: Diagnosis not present

## 2022-02-18 DIAGNOSIS — E785 Hyperlipidemia, unspecified: Secondary | ICD-10-CM | POA: Diagnosis not present

## 2022-02-19 ENCOUNTER — Other Ambulatory Visit: Payer: Self-pay | Admitting: Internal Medicine

## 2022-02-19 DIAGNOSIS — R748 Abnormal levels of other serum enzymes: Secondary | ICD-10-CM

## 2022-02-25 DIAGNOSIS — I10 Essential (primary) hypertension: Secondary | ICD-10-CM | POA: Diagnosis not present

## 2022-02-25 DIAGNOSIS — Z87891 Personal history of nicotine dependence: Secondary | ICD-10-CM | POA: Diagnosis not present

## 2022-02-25 DIAGNOSIS — E039 Hypothyroidism, unspecified: Secondary | ICD-10-CM | POA: Diagnosis not present

## 2022-02-25 DIAGNOSIS — E785 Hyperlipidemia, unspecified: Secondary | ICD-10-CM | POA: Diagnosis not present

## 2022-02-25 DIAGNOSIS — Z8249 Family history of ischemic heart disease and other diseases of the circulatory system: Secondary | ICD-10-CM | POA: Diagnosis not present

## 2022-03-01 ENCOUNTER — Other Ambulatory Visit: Payer: Medicare HMO

## 2022-03-06 ENCOUNTER — Other Ambulatory Visit: Payer: Medicare HMO

## 2022-03-11 ENCOUNTER — Other Ambulatory Visit: Payer: Medicare HMO

## 2022-03-13 ENCOUNTER — Ambulatory Visit
Admission: RE | Admit: 2022-03-13 | Discharge: 2022-03-13 | Disposition: A | Discharge status: Home / Self Care | Payer: Medicare HMO | Source: Ambulatory Visit | Attending: Internal Medicine | Admitting: Internal Medicine

## 2022-03-13 DIAGNOSIS — R748 Abnormal levels of other serum enzymes: Secondary | ICD-10-CM | POA: Diagnosis not present

## 2022-03-13 DIAGNOSIS — Z9049 Acquired absence of other specified parts of digestive tract: Secondary | ICD-10-CM | POA: Diagnosis not present

## 2022-07-25 DIAGNOSIS — K5909 Other constipation: Secondary | ICD-10-CM | POA: Diagnosis not present

## 2022-07-25 DIAGNOSIS — E440 Moderate protein-calorie malnutrition: Secondary | ICD-10-CM | POA: Diagnosis not present

## 2022-07-25 DIAGNOSIS — R2681 Unsteadiness on feet: Secondary | ICD-10-CM | POA: Diagnosis not present

## 2022-07-25 DIAGNOSIS — M25561 Pain in right knee: Secondary | ICD-10-CM | POA: Diagnosis not present

## 2022-07-25 DIAGNOSIS — F5104 Psychophysiologic insomnia: Secondary | ICD-10-CM | POA: Diagnosis not present

## 2022-07-25 DIAGNOSIS — Z8673 Personal history of transient ischemic attack (TIA), and cerebral infarction without residual deficits: Secondary | ICD-10-CM | POA: Diagnosis not present

## 2022-07-25 DIAGNOSIS — E039 Hypothyroidism, unspecified: Secondary | ICD-10-CM | POA: Diagnosis not present

## 2022-07-25 DIAGNOSIS — I10 Essential (primary) hypertension: Secondary | ICD-10-CM | POA: Diagnosis not present

## 2022-07-31 ENCOUNTER — Other Ambulatory Visit (HOSPITAL_COMMUNITY): Payer: Self-pay | Admitting: Family Medicine

## 2022-07-31 DIAGNOSIS — R634 Abnormal weight loss: Secondary | ICD-10-CM

## 2022-08-01 ENCOUNTER — Ambulatory Visit (HOSPITAL_COMMUNITY)
Admission: RE | Admit: 2022-08-01 | Discharge: 2022-08-01 | Disposition: A | Discharge status: Home / Self Care | Payer: Medicare HMO | Source: Ambulatory Visit | Attending: Family Medicine | Admitting: Family Medicine

## 2022-08-01 DIAGNOSIS — R634 Abnormal weight loss: Secondary | ICD-10-CM | POA: Insufficient documentation

## 2022-08-01 DIAGNOSIS — M8580 Other specified disorders of bone density and structure, unspecified site: Secondary | ICD-10-CM | POA: Diagnosis not present

## 2022-08-01 MED ORDER — TECHNETIUM TC 99M MEDRONATE IV KIT
20.0000 | PACK | Freq: Once | INTRAVENOUS | Status: AC | PRN
  Administered 2022-08-01: 22 via INTRAVENOUS

## 2022-08-07 ENCOUNTER — Other Ambulatory Visit (HOSPITAL_COMMUNITY): Payer: Medicare HMO

## 2023-02-07 ENCOUNTER — Emergency Department (HOSPITAL_COMMUNITY): Payer: Medicare HMO | Admitting: Certified Registered Nurse Anesthetist

## 2023-02-07 ENCOUNTER — Other Ambulatory Visit: Payer: Self-pay

## 2023-02-07 ENCOUNTER — Encounter (HOSPITAL_COMMUNITY): Payer: Self-pay

## 2023-02-07 ENCOUNTER — Encounter (HOSPITAL_COMMUNITY)
Admission: EM | Disposition: A | Discharge status: Skilled Nursing Facility | Payer: Self-pay | Source: Home / Self Care | Attending: Neurology

## 2023-02-07 ENCOUNTER — Inpatient Hospital Stay (HOSPITAL_COMMUNITY)
Admission: EM | Admit: 2023-02-07 | Discharge: 2023-02-14 | DRG: 023 | Disposition: A | Discharge status: Skilled Nursing Facility | Payer: Medicare HMO | Attending: Neurology | Admitting: Neurology

## 2023-02-07 ENCOUNTER — Inpatient Hospital Stay (HOSPITAL_COMMUNITY): Payer: Medicare HMO

## 2023-02-07 ENCOUNTER — Emergency Department (HOSPITAL_COMMUNITY): Payer: Medicare HMO

## 2023-02-07 DIAGNOSIS — I69391 Dysphagia following cerebral infarction: Secondary | ICD-10-CM | POA: Diagnosis not present

## 2023-02-07 DIAGNOSIS — Z23 Encounter for immunization: Secondary | ICD-10-CM

## 2023-02-07 DIAGNOSIS — G936 Cerebral edema: Secondary | ICD-10-CM | POA: Diagnosis not present

## 2023-02-07 DIAGNOSIS — I63512 Cerebral infarction due to unspecified occlusion or stenosis of left middle cerebral artery: Secondary | ICD-10-CM | POA: Diagnosis not present

## 2023-02-07 DIAGNOSIS — R4781 Slurred speech: Secondary | ICD-10-CM | POA: Diagnosis not present

## 2023-02-07 DIAGNOSIS — R1310 Dysphagia, unspecified: Secondary | ICD-10-CM | POA: Diagnosis not present

## 2023-02-07 DIAGNOSIS — I639 Cerebral infarction, unspecified: Principal | ICD-10-CM | POA: Diagnosis present

## 2023-02-07 DIAGNOSIS — T80818A Extravasation of other vesicant agent, initial encounter: Secondary | ICD-10-CM | POA: Diagnosis not present

## 2023-02-07 DIAGNOSIS — Z1152 Encounter for screening for COVID-19: Secondary | ICD-10-CM | POA: Diagnosis not present

## 2023-02-07 DIAGNOSIS — I10 Essential (primary) hypertension: Secondary | ICD-10-CM | POA: Diagnosis present

## 2023-02-07 DIAGNOSIS — F0390 Unspecified dementia without behavioral disturbance: Secondary | ICD-10-CM | POA: Diagnosis present

## 2023-02-07 DIAGNOSIS — R2681 Unsteadiness on feet: Secondary | ICD-10-CM | POA: Diagnosis not present

## 2023-02-07 DIAGNOSIS — E43 Unspecified severe protein-calorie malnutrition: Secondary | ICD-10-CM | POA: Diagnosis not present

## 2023-02-07 DIAGNOSIS — R29810 Facial weakness: Secondary | ICD-10-CM | POA: Diagnosis not present

## 2023-02-07 DIAGNOSIS — I6782 Cerebral ischemia: Secondary | ICD-10-CM | POA: Diagnosis not present

## 2023-02-07 DIAGNOSIS — E441 Mild protein-calorie malnutrition: Secondary | ICD-10-CM | POA: Diagnosis not present

## 2023-02-07 DIAGNOSIS — Z79890 Hormone replacement therapy (postmenopausal): Secondary | ICD-10-CM | POA: Diagnosis not present

## 2023-02-07 DIAGNOSIS — Z882 Allergy status to sulfonamides status: Secondary | ICD-10-CM

## 2023-02-07 DIAGNOSIS — R1312 Dysphagia, oropharyngeal phase: Secondary | ICD-10-CM | POA: Diagnosis not present

## 2023-02-07 DIAGNOSIS — R29818 Other symptoms and signs involving the nervous system: Secondary | ICD-10-CM | POA: Diagnosis not present

## 2023-02-07 DIAGNOSIS — E039 Hypothyroidism, unspecified: Secondary | ICD-10-CM | POA: Diagnosis present

## 2023-02-07 DIAGNOSIS — R531 Weakness: Secondary | ICD-10-CM | POA: Diagnosis not present

## 2023-02-07 DIAGNOSIS — G629 Polyneuropathy, unspecified: Secondary | ICD-10-CM | POA: Diagnosis present

## 2023-02-07 DIAGNOSIS — Z7401 Bed confinement status: Secondary | ICD-10-CM | POA: Diagnosis not present

## 2023-02-07 DIAGNOSIS — G8191 Hemiplegia, unspecified affecting right dominant side: Secondary | ICD-10-CM | POA: Diagnosis present

## 2023-02-07 DIAGNOSIS — R262 Difficulty in walking, not elsewhere classified: Secondary | ICD-10-CM | POA: Diagnosis not present

## 2023-02-07 DIAGNOSIS — Z743 Need for continuous supervision: Secondary | ICD-10-CM | POA: Diagnosis not present

## 2023-02-07 DIAGNOSIS — Z87891 Personal history of nicotine dependence: Secondary | ICD-10-CM

## 2023-02-07 DIAGNOSIS — Z7982 Long term (current) use of aspirin: Secondary | ICD-10-CM

## 2023-02-07 DIAGNOSIS — I6340 Cerebral infarction due to embolism of unspecified cerebral artery: Secondary | ICD-10-CM | POA: Diagnosis not present

## 2023-02-07 DIAGNOSIS — R414 Neurologic neglect syndrome: Secondary | ICD-10-CM | POA: Diagnosis not present

## 2023-02-07 DIAGNOSIS — I69320 Aphasia following cerebral infarction: Secondary | ICD-10-CM | POA: Diagnosis not present

## 2023-02-07 DIAGNOSIS — D649 Anemia, unspecified: Secondary | ICD-10-CM | POA: Diagnosis not present

## 2023-02-07 DIAGNOSIS — I6389 Other cerebral infarction: Secondary | ICD-10-CM | POA: Diagnosis not present

## 2023-02-07 DIAGNOSIS — Z79899 Other long term (current) drug therapy: Secondary | ICD-10-CM

## 2023-02-07 DIAGNOSIS — E785 Hyperlipidemia, unspecified: Secondary | ICD-10-CM | POA: Diagnosis not present

## 2023-02-07 DIAGNOSIS — I6602 Occlusion and stenosis of left middle cerebral artery: Secondary | ICD-10-CM | POA: Diagnosis not present

## 2023-02-07 DIAGNOSIS — Z681 Body mass index (BMI) 19 or less, adult: Secondary | ICD-10-CM | POA: Diagnosis not present

## 2023-02-07 DIAGNOSIS — I609 Nontraumatic subarachnoid hemorrhage, unspecified: Secondary | ICD-10-CM | POA: Diagnosis not present

## 2023-02-07 DIAGNOSIS — M6281 Muscle weakness (generalized): Secondary | ICD-10-CM | POA: Diagnosis not present

## 2023-02-07 DIAGNOSIS — R270 Ataxia, unspecified: Secondary | ICD-10-CM | POA: Diagnosis not present

## 2023-02-07 DIAGNOSIS — Z4682 Encounter for fitting and adjustment of non-vascular catheter: Secondary | ICD-10-CM | POA: Diagnosis not present

## 2023-02-07 DIAGNOSIS — I6619 Occlusion and stenosis of unspecified anterior cerebral artery: Secondary | ICD-10-CM | POA: Diagnosis not present

## 2023-02-07 DIAGNOSIS — Z880 Allergy status to penicillin: Secondary | ICD-10-CM

## 2023-02-07 DIAGNOSIS — R29719 NIHSS score 19: Secondary | ICD-10-CM | POA: Diagnosis not present

## 2023-02-07 DIAGNOSIS — R4701 Aphasia: Secondary | ICD-10-CM | POA: Diagnosis not present

## 2023-02-07 DIAGNOSIS — I611 Nontraumatic intracerebral hemorrhage in hemisphere, cortical: Secondary | ICD-10-CM | POA: Diagnosis not present

## 2023-02-07 DIAGNOSIS — R471 Dysarthria and anarthria: Secondary | ICD-10-CM | POA: Diagnosis present

## 2023-02-07 DIAGNOSIS — Z91040 Latex allergy status: Secondary | ICD-10-CM

## 2023-02-07 DIAGNOSIS — Z8673 Personal history of transient ischemic attack (TIA), and cerebral infarction without residual deficits: Secondary | ICD-10-CM

## 2023-02-07 HISTORY — PX: IR CT HEAD LTD: IMG2386

## 2023-02-07 HISTORY — PX: IR US GUIDE VASC ACCESS RIGHT: IMG2390

## 2023-02-07 HISTORY — PX: RADIOLOGY WITH ANESTHESIA: SHX6223

## 2023-02-07 HISTORY — PX: IR TRANSCATH/EMBOLIZ: IMG695

## 2023-02-07 HISTORY — PX: IR PERCUTANEOUS ART THROMBECTOMY/INFUSION INTRACRANIAL INC DIAG ANGIO: IMG6087

## 2023-02-07 LAB — CBC
HCT: 44.2 % (ref 36.0–46.0)
Hemoglobin: 14.6 g/dL (ref 12.0–15.0)
MCH: 31.2 pg (ref 26.0–34.0)
MCHC: 33 g/dL (ref 30.0–36.0)
MCV: 94.4 fL (ref 80.0–100.0)
Platelets: 291 10*3/uL (ref 150–400)
RBC: 4.68 MIL/uL (ref 3.87–5.11)
RDW: 12.7 % (ref 11.5–15.5)
WBC: 6.5 10*3/uL (ref 4.0–10.5)
nRBC: 0 % (ref 0.0–0.2)

## 2023-02-07 LAB — I-STAT CHEM 8, ED
BUN: 24 mg/dL — ABNORMAL HIGH (ref 8–23)
Calcium, Ion: 1.27 mmol/L (ref 1.15–1.40)
Chloride: 110 mmol/L (ref 98–111)
Creatinine, Ser: 0.8 mg/dL (ref 0.44–1.00)
Glucose, Bld: 155 mg/dL — ABNORMAL HIGH (ref 70–99)
HCT: 46 % (ref 36.0–46.0)
Hemoglobin: 15.6 g/dL — ABNORMAL HIGH (ref 12.0–15.0)
Potassium: 3.6 mmol/L (ref 3.5–5.1)
Sodium: 144 mmol/L (ref 135–145)
TCO2: 21 mmol/L — ABNORMAL LOW (ref 22–32)

## 2023-02-07 LAB — DIFFERENTIAL
Abs Immature Granulocytes: 0.02 10*3/uL (ref 0.00–0.07)
Basophils Absolute: 0.1 10*3/uL (ref 0.0–0.1)
Basophils Relative: 1 %
Eosinophils Absolute: 0 10*3/uL (ref 0.0–0.5)
Eosinophils Relative: 0 %
Immature Granulocytes: 0 %
Lymphocytes Relative: 19 %
Lymphs Abs: 1.3 10*3/uL (ref 0.7–4.0)
Monocytes Absolute: 0.4 10*3/uL (ref 0.1–1.0)
Monocytes Relative: 6 %
Neutro Abs: 4.8 10*3/uL (ref 1.7–7.7)
Neutrophils Relative %: 74 %

## 2023-02-07 LAB — PROTIME-INR
INR: 1.1 (ref 0.8–1.2)
Prothrombin Time: 14.2 s (ref 11.4–15.2)

## 2023-02-07 LAB — APTT: aPTT: 26 s (ref 24–36)

## 2023-02-07 LAB — COMPREHENSIVE METABOLIC PANEL
ALT: 11 U/L (ref 0–44)
AST: 20 U/L (ref 15–41)
Albumin: 3.8 g/dL (ref 3.5–5.0)
Alkaline Phosphatase: 180 U/L — ABNORMAL HIGH (ref 38–126)
Anion gap: 10 (ref 5–15)
BUN: 20 mg/dL (ref 8–23)
CO2: 22 mmol/L (ref 22–32)
Calcium: 10 mg/dL (ref 8.9–10.3)
Chloride: 112 mmol/L — ABNORMAL HIGH (ref 98–111)
Creatinine, Ser: 0.82 mg/dL (ref 0.44–1.00)
GFR, Estimated: >60 mL/min (ref >60)
Glucose, Bld: 155 mg/dL — ABNORMAL HIGH (ref 70–99)
Potassium: 3.6 mmol/L (ref 3.5–5.1)
Sodium: 144 mmol/L (ref 135–145)
Total Bilirubin: 0.7 mg/dL (ref 0.3–1.2)
Total Protein: 7.5 g/dL (ref 6.5–8.1)

## 2023-02-07 LAB — SARS CORONAVIRUS 2 BY RT PCR: SARS Coronavirus 2 by RT PCR: NEGATIVE

## 2023-02-07 LAB — CBG MONITORING, ED: Glucose-Capillary: 133 mg/dL — ABNORMAL HIGH (ref 70–99)

## 2023-02-07 LAB — GLUCOSE, CAPILLARY
Glucose-Capillary: 121 mg/dL — ABNORMAL HIGH (ref 70–99)
Glucose-Capillary: 145 mg/dL — ABNORMAL HIGH (ref 70–99)

## 2023-02-07 LAB — ETHANOL: Alcohol, Ethyl (B): <10 mg/dL (ref <10)

## 2023-02-07 LAB — HEMOGLOBIN A1C
Hgb A1c MFr Bld: 5.2 % (ref 4.8–5.6)
Mean Plasma Glucose: 102.54 mg/dL

## 2023-02-07 LAB — MAGNESIUM: Magnesium: 1.8 mg/dL (ref 1.7–2.4)

## 2023-02-07 LAB — PHOSPHORUS: Phosphorus: 2.5 mg/dL (ref 2.5–4.6)

## 2023-02-07 LAB — MRSA NEXT GEN BY PCR, NASAL: MRSA by PCR Next Gen: NOT DETECTED

## 2023-02-07 SURGERY — RADIOLOGY WITH ANESTHESIA
Anesthesia: General

## 2023-02-07 MED ORDER — IOHEXOL 350 MG/ML SOLN
100.0000 mL | Freq: Once | INTRAVENOUS | Status: AC | PRN
  Administered 2023-02-07: 100 mL via INTRAVENOUS

## 2023-02-07 MED ORDER — LEVOTHYROXINE SODIUM 112 MCG PO TABS
112.0000 ug | ORAL_TABLET | Freq: Every day | ORAL | Status: DC
  Administered 2023-02-08: 112 ug via ORAL
  Filled 2023-02-07: qty 1

## 2023-02-07 MED ORDER — ACETAMINOPHEN 160 MG/5ML PO SOLN
650.0000 mg | ORAL | Status: DC | PRN
Start: 2023-02-07 — End: 2023-02-07

## 2023-02-07 MED ORDER — PROSOURCE TF20 ENFIT COMPATIBL EN LIQD
60.0000 mL | Freq: Every day | ENTERAL | Status: DC
  Administered 2023-02-08 – 2023-02-13 (×6): 60 mL
  Filled 2023-02-07 (×6): qty 60

## 2023-02-07 MED ORDER — SUCCINYLCHOLINE CHLORIDE 200 MG/10ML IV SOSY
PREFILLED_SYRINGE | INTRAVENOUS | Status: DC | PRN
  Administered 2023-02-07: 100 mg via INTRAVENOUS

## 2023-02-07 MED ORDER — ADULT MULTIVITAMIN W/MINERALS CH
1.0000 | ORAL_TABLET | Freq: Every day | ORAL | Status: DC
  Administered 2023-02-08 – 2023-02-11 (×4): 1
  Filled 2023-02-07 (×4): qty 1

## 2023-02-07 MED ORDER — ACETAMINOPHEN 325 MG PO TABS
650.0000 mg | ORAL_TABLET | ORAL | Status: DC | PRN
Start: 2023-02-07 — End: 2023-02-14
  Administered 2023-02-13 – 2023-02-14 (×2): 650 mg via ORAL
  Filled 2023-02-07 (×2): qty 2

## 2023-02-07 MED ORDER — ACETAMINOPHEN 650 MG RE SUPP: 650.0000 mg | RECTAL | Status: DC | PRN

## 2023-02-07 MED ORDER — ASPIRIN 325 MG PO TABS: 325.0000 mg | ORAL_TABLET | Freq: Every day | ORAL | Status: DC

## 2023-02-07 MED ORDER — NITROGLYCERIN IN D5W 200-5 MCG/ML-% IV SOLN
INTRAVENOUS | Status: DC | PRN
  Administered 2023-02-07: .08 mg via INTRAVENOUS
  Administered 2023-02-07: .12 mg via INTRAVENOUS
  Administered 2023-02-07: .04 mg via INTRAVENOUS
  Administered 2023-02-07: .08 mg via INTRAVENOUS

## 2023-02-07 MED ORDER — CEFAZOLIN SODIUM-DEXTROSE 2-4 GM/100ML-% IV SOLN
INTRAVENOUS | Status: AC
  Filled 2023-02-07: qty 100

## 2023-02-07 MED ORDER — ONDANSETRON HCL 4 MG/2ML IJ SOLN
INTRAMUSCULAR | Status: DC | PRN
  Administered 2023-02-07: 4 mg via INTRAVENOUS

## 2023-02-07 MED ORDER — OSMOLITE 1.5 CAL PO LIQD
1000.0000 mL | ORAL | Status: DC
  Administered 2023-02-07 – 2023-02-10 (×3): 1000 mL

## 2023-02-07 MED ORDER — ACETAMINOPHEN 325 MG PO TABS: 650.0000 mg | ORAL_TABLET | ORAL | Status: DC | PRN

## 2023-02-07 MED ORDER — ORAL CARE MOUTH RINSE
15.0000 mL | OROMUCOSAL | Status: DC | PRN
Start: 2023-02-07 — End: 2023-02-07

## 2023-02-07 MED ORDER — SODIUM CHLORIDE 0.9 % IV BOLUS: 250.0000 mL | INTRAVENOUS | Status: AC | PRN

## 2023-02-07 MED ORDER — ORAL CARE MOUTH RINSE: 15.0000 mL | OROMUCOSAL | Status: DC | PRN

## 2023-02-07 MED ORDER — ASPIRIN 300 MG RE SUPP: 300.0000 mg | Freq: Every day | RECTAL | Status: DC

## 2023-02-07 MED ORDER — CHLORHEXIDINE GLUCONATE CLOTH 2 % EX PADS
6.0000 | MEDICATED_PAD | Freq: Every day | CUTANEOUS | Status: DC
  Administered 2023-02-07 – 2023-02-14 (×6): 6 via TOPICAL

## 2023-02-07 MED ORDER — ACETAMINOPHEN 160 MG/5ML PO SOLN: 650.0000 mg | ORAL | Status: DC | PRN

## 2023-02-07 MED ORDER — SUGAMMADEX SODIUM 200 MG/2ML IV SOLN
INTRAVENOUS | Status: DC | PRN
  Administered 2023-02-07: 100 mg via INTRAVENOUS

## 2023-02-07 MED ORDER — ROCURONIUM BROMIDE 10 MG/ML (PF) SYRINGE
PREFILLED_SYRINGE | INTRAVENOUS | Status: DC | PRN
  Administered 2023-02-07: 30 mg via INTRAVENOUS

## 2023-02-07 MED ORDER — PANTOPRAZOLE SODIUM 40 MG IV SOLR
40.0000 mg | Freq: Every day | INTRAVENOUS | Status: DC
  Administered 2023-02-07 – 2023-02-08 (×2): 40 mg via INTRAVENOUS
  Filled 2023-02-07 (×2): qty 10

## 2023-02-07 MED ORDER — FENTANYL CITRATE (PF) 100 MCG/2ML IJ SOLN
INTRAMUSCULAR | Status: AC
  Filled 2023-02-07: qty 2

## 2023-02-07 MED ORDER — IOHEXOL 300 MG/ML  SOLN
100.0000 mL | Freq: Once | INTRAMUSCULAR | Status: AC | PRN
  Administered 2023-02-07: 30 mL via INTRA_ARTERIAL

## 2023-02-07 MED ORDER — LIDOCAINE 2% (20 MG/ML) 5 ML SYRINGE
INTRAMUSCULAR | Status: DC | PRN
  Administered 2023-02-07: 50 mg via INTRAVENOUS

## 2023-02-07 MED ORDER — SODIUM CHLORIDE 0.9% FLUSH: 3.0000 mL | Freq: Once | INTRAVENOUS | Status: DC

## 2023-02-07 MED ORDER — LACTATED RINGERS IV SOLN: INTRAVENOUS | Status: DC | PRN

## 2023-02-07 MED ORDER — PHENYLEPHRINE HCL-NACL 20-0.9 MG/250ML-% IV SOLN
INTRAVENOUS | Status: DC | PRN
  Administered 2023-02-07: 75 ug/min via INTRAVENOUS

## 2023-02-07 MED ORDER — SODIUM CHLORIDE 0.9 % IV SOLN: INTRAVENOUS | Status: DC

## 2023-02-07 MED ORDER — CEFAZOLIN SODIUM-DEXTROSE 2-3 GM-%(50ML) IV SOLR
INTRAVENOUS | Status: DC | PRN
  Administered 2023-02-07: 2 g via INTRAVENOUS

## 2023-02-07 MED ORDER — CLEVIDIPINE BUTYRATE 0.5 MG/ML IV EMUL: 0.0000 mg/h | INTRAVENOUS | Status: DC

## 2023-02-07 MED ORDER — IOHEXOL 300 MG/ML  SOLN
150.0000 mL | Freq: Once | INTRAMUSCULAR | Status: AC | PRN
  Administered 2023-02-07: 50 mL via INTRA_ARTERIAL

## 2023-02-07 MED ORDER — ESMOLOL HCL 100 MG/10ML IV SOLN
INTRAVENOUS | Status: DC | PRN
  Administered 2023-02-07 (×2): 30 mg via INTRAVENOUS

## 2023-02-07 MED ORDER — ORAL CARE MOUTH RINSE
15.0000 mL | OROMUCOSAL | Status: DC
Start: 2023-02-08 — End: 2023-02-10
  Administered 2023-02-08 – 2023-02-09 (×7): 15 mL via OROMUCOSAL

## 2023-02-07 MED ORDER — STROKE: EARLY STAGES OF RECOVERY BOOK
Freq: Once | Status: DC
  Filled 2023-02-07: qty 1

## 2023-02-07 MED ORDER — NITROGLYCERIN 1 MG/10 ML FOR IR/CATH LAB
INTRA_ARTERIAL | Status: AC
Start: 2023-02-07 — End: ?
  Filled 2023-02-07: qty 10

## 2023-02-07 MED ORDER — FENTANYL CITRATE (PF) 100 MCG/2ML IJ SOLN
INTRAMUSCULAR | Status: DC | PRN
  Administered 2023-02-07: 25 ug via INTRAVENOUS

## 2023-02-07 MED ORDER — PROPOFOL 10 MG/ML IV BOLUS
INTRAVENOUS | Status: DC | PRN
  Administered 2023-02-07 (×2): 30 mg via INTRAVENOUS
  Administered 2023-02-07: 90 mg via INTRAVENOUS

## 2023-02-07 NOTE — ED Notes (Signed)
PT transported to IR with this RN and code stroke team

## 2023-02-07 NOTE — Transfer of Care (Signed)
Immediate Anesthesia Transfer of Care Note  Patient: Kristen Parks  Procedure(s) Performed: RADIOLOGY WITH ANESTHESIA  Patient Location: PACU  Anesthesia Type:General  Level of Consciousness: awake and alert   Airway & Oxygen Therapy: Patient Spontanous Breathing and Patient connected to nasal cannula oxygen  Post-op Assessment: Report given to RN and Post -op Vital signs reviewed and stable  Post vital signs: Reviewed and stable  Last Vitals:  Vitals Value Taken Time  BP 134/84 02/07/23 1319  Temp 98   Pulse 62 02/07/23 1327  Resp 11 02/07/23 1327  SpO2 94 % 02/07/23 1327  Vitals shown include unfiled device data.  Last Pain:  Vitals:   02/07/23 1115  TempSrc: Oral         Complications: There were no known notable events for this encounter.

## 2023-02-07 NOTE — ED Triage Notes (Signed)
Pt to ED via pov w/granddaughter. Granddaughter states that pt was fine last night at 10pm including walking around, ate dinner, and talking (has dementia). Pt was found at 0830 this morning and is not talking, looking to the left, has right hand drawn up and is not conversing with anyone. Pt would not walk this am. Pt only saying numbers when asked any question.

## 2023-02-07 NOTE — ED Notes (Signed)
Dr. Amada Jupiter obtained verbal obtained verbal consent from family at bedside for IR procedure.

## 2023-02-07 NOTE — Code Documentation (Signed)
Stroke Response Nurse Documentation Code Documentation  Kristen Parks is a 82 y.o. female arriving to [facility]  via [employer] on 02/07/2023 with past medical hx of HTN, dyslipidemia, anemia, hypothyroidism, left corpus collosum infarction 2019, chronic small vessel desease. On No antithrombotic. Code stroke was activated by ED.   Patient from home where she was LKW at 2200 02/06/2023 and now complaining of slurred speech and left visual preference. Per family, patient was last seen normal at 2200 last night before bed and found altered this morning at 0830 with slurred speech and a leftward gaze.  Stroke team at the bedside on patient arrival. Labs drawn and patient cleared for CT by Dr. Rosalia Hammers. Patient to CT with team. NIHSS 18, see documentation for details and code stroke times. Patient with disoriented, not following commands, left gaze preference , right hemianopia, right facial droop, right arm weakness, right leg weakness, Global aphasia , dysarthria , and Visual  neglect on exam. The following imaging was completed:  CT Head, CTA, and CTP. Patient is not a candidate for IV Thrombolytic due to outside of TNK window per MD. Patient is a candidate for IR due to LVO noted on imaging per MD.   Care Plan: Taken to IR.   Bedside handoff with IR RN [place].    Felecia Jan  Stroke Response RN

## 2023-02-07 NOTE — H&P (Signed)
Stroke Neurology Admission History & Physical   CC: altered mental status  History is obtained from:chart review and family discussion.   HPI: Kristen Parks is a 82 y.o. female with PMH significant for HTN, dyslipidemia, anemia, hypothyroidism, Left corpus collosum infarction 2019, chronic small vessel disease who was brought into ED d/t slurred speech and aphasia. Perfamily, LKW was 220 last night before bed and patient was found altered around 0830 this am.  Code stroke was activated in Triage and patient was taken emergently to CT. On assessment in CT, patient was found to have left-gaze preference, aphasia, dysarthria, right-sided weakness. NIH 19. Per discussion with family, patient's rankin score is a 3, but functional. CTH negative for hemorrhage, shows possible infarct posterior left subfrontal gyrus. CTA w/P shows: Left MCA M2 stenosis vs embolus with 12ml core infarct. Case discussed between Dr. Amada Jupiter and Dr. Tommie Sams, and with patient's family. Risks, benefits, alternatives of IV thrombolysis were discussed and family/patient agreed to proceed. Patient taken to IR for thrombectomy.   LKW: 2200 10/24 tnk given?: no, outside of window Premorbid modified rankin scale: 3, per family functional  NIHSS:  1a Level of Conscious.: 0 1b LOC Questions: 2 1c LOC Commands: 2 2 Best Gaze: 2 3 Visual: 2 4 Facial Palsy: 2 5a Motor Arm - left: 0 5b Motor Arm - Right:2  6a Motor Leg - Left: 0 6b Motor Leg - Right: 1 7 Limb Ataxia: 0 8 Sensory: 1 9 Best Language: 2 10 Dysarthria: 2 11 Extinct. and Inatten.: 1 TOTAL: 19   LKW: 2200 10/24 TNK given? No, outside of window Mechanical thrombectomy? Yes Risks, benefits, alternatives of IV thrombolysis were discussed and family/patient agreed to proceed. CT imaging was reviewed personally prior to IV thrombolysis administration with no evidence of bleed.  Premorbid modified Rankin scale (mRS): 3. Per family, functional.    ROS: Unable to obtain due to altered mental status.   Past Medical History:  Diagnosis Date   Acquired hypothyroidism    Arthritis    "RLE" (04/15/2018)   CVA (cerebral vascular accident) ([facility]) 04/14/2018   "don't feel any different and daughter doesn't notice any changes" (04/15/2018)   Dementia ([facility])    Dyslipidemia    Essential hypertension    Headache    "q couple months" (04/15/2018)   History of blood transfusion 04/14/2018   "low counts" (04/15/2018)   Migraine    "q couple months" (04/15/2018)   Peripheral neuropathy     History reviewed. No pertinent family history.  Social History:   reports that she quit smoking about 56 years ago. She started smoking about 76 years ago. She has a 10 pack-year smoking history. She has never used smokeless tobacco. She reports that she does not drink alcohol and does not use drugs.  Medications  Current Facility-Administered Medications:     stroke: early stages of recovery book, , Does not apply, Once, Rejeana Brock, MD   0.9 %  sodium chloride infusion, , Intravenous, Continuous, Kobe Ofallon, Hardin Negus, MD   acetaminophen (TYLENOL) tablet 650 mg, 650 mg, Oral, Q4H PRN **OR** acetaminophen (TYLENOL) 160 MG/5ML solution 650 mg, 650 mg, Per Tube, Q4H PRN **OR** acetaminophen (TYLENOL) suppository 650 mg, 650 mg, Rectal, Q4H PRN, Rejeana Brock, MD   aspirin suppository 300 mg, 300 mg, Rectal, Daily **OR** aspirin tablet 325 mg, 325 mg, Oral, Daily, Myliah Medel, Hardin Negus, MD   iohexol (OMNIPAQUE) 300 MG/ML solution 100 mL, 100 mL, Intra-arterial, Once PRN, de Marcial Pacas  Kizzie Fantasia, MD   iohexol (OMNIPAQUE) 300 MG/ML solution 150 mL, 150 mL, Intra-arterial, Once PRN, de Melchor Amour, Jerilynn Mages, MD   sodium chloride flush (NS) 0.9 % injection 3 mL, 3 mL, Intravenous, Once, Royanne Foots, DO  Current Outpatient Medications:    aspirin EC 81 MG EC tablet, Take 1 tablet (81 mg total) by mouth daily., Disp: 30 tablet,  Rfl: 1   feeding supplement, ENSURE ENLIVE, (ENSURE ENLIVE) LIQD, Take 237 mLs by mouth 2 (two) times daily between meals., Disp: 237 mL, Rfl: 12   ferrous sulfate 325 (65 FE) MG EC tablet, Take 1 tablet (325 mg total) by mouth 2 (two) times daily., Disp: 60 tablet, Rfl: 3   gabapentin (NEURONTIN) 300 MG capsule, Take 300 mg by mouth at bedtime. , Disp: , Rfl:    hydrOXYzine (ATARAX/VISTARIL) 25 MG tablet, Take 25 mg by mouth at bedtime as needed for itching., Disp: , Rfl:    levothyroxine (SYNTHROID, LEVOTHROID) 112 MCG tablet, Take 112 mcg by mouth daily. , Disp: , Rfl:    Multiple Vitamin (MULTIVITAMIN WITH MINERALS) TABS tablet, Take 1 tablet by mouth daily., Disp: 30 tablet, Rfl: 1   omeprazole (PRILOSEC) 40 MG capsule, Take 40 mg by mouth daily., Disp: , Rfl:    simvastatin (ZOCOR) 20 MG tablet, Take 20 mg by mouth at bedtime. , Disp: , Rfl:    vitamin B-12 (CYANOCOBALAMIN) 500 MCG tablet, Take 1 tablet (500 mcg total) by mouth daily., Disp: 30 tablet, Rfl: 1  Facility-Administered Medications Ordered in Other Encounters:    fentaNYL (SUBLIMAZE) injection, , Intravenous, Anesthesia Intra-op, [place], Alisha B, CRNA, 25 mcg at 02/07/23 1213   lactated ringers infusion, , Intravenous, Continuous PRN, Samara Deist, CRNA, New Bag at 02/07/23 1134   lidocaine 2% (20 mg/mL) 5 mL syringe, , Intravenous, Anesthesia Intra-op, [place], Alisha B, CRNA, 50 mg at 02/07/23 1136   phenylephrine (NEO-SYNEPHRINE) 20mg /NS premix infusion, , Intravenous, Continuous PRN, Irean Hong B, CRNA, Stopped at 02/07/23 1208   propofol (DIPRIVAN) 10 mg/mL bolus/IV push, , Intravenous, Anesthesia Intra-op, [place], Alisha B, CRNA, 30 mg at 02/07/23 1213   rocuronium bromide 10 mg/mL (PF) syringe, , Intravenous, Anesthesia Intra-op, [place], Alisha B, CRNA, 30 mg at 02/07/23 1143   succinylcholine (ANECTINE) syringe, , Intravenous, Anesthesia Intra-op, Irean Hong B, CRNA, 100 mg at 02/07/23  1136   Exam: Current vital signs: BP (!) 134/97   Pulse 68   Temp (!) 96.7 F (35.9 C) (Oral)   Resp 14   SpO2 100%  Vital signs in last 24 hours: Temp:  [96.7 F (35.9 C)] 96.7 F (35.9 C) (10/25 1115) Pulse Rate:  [68-73] 68 (10/25 1115) Resp:  [14] 14 (10/25 1019) BP: (134-158)/(89-113) 134/97 (10/25 1115) SpO2:  [95 %-100 %] 100 % (10/25 1115)  GENERAL: Awake in NAD HEENT: - Normocephalic and atraumatic, dry mm LUNGS - Unlabored breathing, no supplemental oxygen needed.  CV - S1S2 RRR,equal pulses bilaterally. ABDOMEN - Soft, nontender, nondistended  Ext: warm, well perfused, intact peripheral pulses, no edema  Neuro: Mental Status: Patient is awake, alert. Does not follow commands.  Answers questions with incoherent sounds or extremely slurred speech.   Cranial Nerves: II: Unable to test VF, does not blink to threat on right. Pupils are equal, round, and reactive to light.   III,IV, VI: left-gaze preference, does not cross midline.  V: unable to test facial sensation VII: right-sided facial droop VIII: hearing is intact to voice X: unable to  assess XI: unable to assess. XII: unable to assess  Motor: Right-sided weakness. RUE drawn up with clenched fist.  Sensory: Withdraws to pain, more in right than left.  Cerebellar: Unable to assess  Labs I have reviewed labs in epic and the results pertinent to this consultation are:  CBC    Component Value Date/Time   WBC 6.5 02/07/2023 1045   RBC 4.68 02/07/2023 1045   HGB 15.6 (H) 02/07/2023 1051   HCT 46.0 02/07/2023 1051   PLT 291 02/07/2023 1045   MCV 94.4 02/07/2023 1045   MCH 31.2 02/07/2023 1045   MCHC 33.0 02/07/2023 1045   RDW 12.7 02/07/2023 1045   LYMPHSABS 1.3 02/07/2023 1045   MONOABS 0.4 02/07/2023 1045   EOSABS 0.0 02/07/2023 1045   BASOSABS 0.1 02/07/2023 1045    CMP     Component Value Date/Time   NA 144 02/07/2023 1051   K 3.6 02/07/2023 1051   CL 110 02/07/2023 1051   CO2 22  02/07/2023 1045   GLUCOSE 155 (H) 02/07/2023 1051   BUN 24 (H) 02/07/2023 1051   CREATININE 0.80 02/07/2023 1051   CALCIUM 10.0 02/07/2023 1045   PROT 7.5 02/07/2023 1045   ALBUMIN 3.8 02/07/2023 1045   AST 20 02/07/2023 1045   ALT 11 02/07/2023 1045   ALKPHOS 180 (H) 02/07/2023 1045   BILITOT 0.7 02/07/2023 1045   GFRNONAA >60 02/07/2023 1045   GFRAA >60 04/16/2018 0332    Lipid Panel     Component Value Date/Time   CHOL 89 04/15/2018 0339   TRIG 97 04/15/2018 0339   HDL 31 (L) 04/15/2018 0339   CHOLHDL 2.9 04/15/2018 0339   VLDL 19 04/15/2018 0339   LDLCALC 39 04/15/2018 0339     Imaging I have reviewed the images obtained:  CT Head: Possible new loss of gray-white differentiation along the posterior aspect of the left subfrontal gyrus, which could represent an acute infarct. No acute hemorrhage. Background of severe chronic small-vessel disease with old infarct along the left middle frontal gyrus and old perforator infarcts in the bilateral thalami and left cerebellar hemisphere  CT Angio Head and Neck w/Perf:  Left MCA M2 high-grade stenosis versus sub-occlusive embolus.  12ml core infarct in left frontoparietal junction  Assessment/Plan:   Acute Ischemic Infarct  Acuity: Acute Current Suspected Etiology: small vessel disease Continue Evaluation:  -Admit to: ICU -Continue Aspirin/ Statin, additional antithrombotics per IR - Blood Pressure Goal: SBP 120-160 for first 24 hours then less than 180 -MRI/ECHO/A1C/Lipid panel. -Hyperglycemia management per SSI to maintain glucose 140-180mg /dL. -PT/OT/ST therapies and recommendations when able  CNS Dysarthria Dysphagia following cerebral infarction  -NPO until cleared by speech -ST -Advance diet as tolerated -May need PEG  Hemiplegia and hemiparesis following cerebral infarction affecting right dominant side -PT/OT  RESP Continue to monitor Possible need to stay intubated post-procedure Appreciate CCM  assistance   CV Essential (primary) hypertension -Aggressive BP control, Blood Pressure Goal: SBP 120-160 for first 24 hours then less than 180 -Titrate oral agents  Hyperlipidemia, unspecified  - Statin for goal LDL < 70   HEME -Monitor -transfuse for hgb < 7   ENDO -SSI -goal HgbA1c < 7  GI/GU -Gentle hydration  Fluid/Electrolyte Disorders -Replete electrolytes as needed -Repeat labs daily AM  Nutrition E46 Protein-Calorie Malnutrition Mild Moderate Severe -diet consult  Prophylaxis DVT:  SCDs GI: Protonix Bowel: Senna  Diet: NPO until cleared by speech  Code Status: Full code   THE FOLLOWING WERE PRESENT ON ADMISSION:  CNS -  Acute Ischemic Stroke, Hemiplegia   Risks, benefits, alternatives of IV thrombolysis were discussed and family/patient agreed to proceed. CT imaging was reviewed personally prior to IV thrombolysis administration with no evidence of bleed. --    Pt seen by Neuro NP/APP and later by MD. Note/plan to be edited by MD as needed.    Lynnae January, DNP, AGACNP-BC [facility] Please use AMION for contact information & EPIC for messaging.   I have seen the patient and was present for the entirety of the evaluation and management reflected in the above note.  Ms. Zackery is an 82 year old female with mild dementia who is relatively independent, needing help to prepare meals but otherwise taking care of all of her activities of daily living at baseline (MRS 3).  She presents with new onset severe right-sided weakness and aphasia.  She has a significant area of penumbra by CT perfusion, with a suspected short segment M2 occlusion in the left MCA.  Based on these findings, she was considered for thrombectomy.  On my review, I think there may be some hypodensity on the CT in this territory and it may be underestimating true infarct on CT perfusion, but difficult to know by how much and to what degree she may have improvement if we  reperfuse this area.  Based on that information, I discussed with the patient's daughter as did Dr. Tommie Sams, and they are aware of the risks and possible benefits of thrombectomy.  I described there was a risk of hemorrhage as well as the possibility that even with reperfusion she may end up having a significant stroke.  She will be admitted to the neurology service following thrombectomy in the intensive care unit.  This patient is critically ill and at significant risk of neurological worsening, death and care requires constant monitoring of vital signs, hemodynamics,respiratory and cardiac monitoring, neurological assessment, discussion with family, other specialists and medical decision making of high complexity. I spent 55 minutes of neurocritical care time  in the care of  this patient. This was time spent independent of any time provided by nurse practitioner or PA.  Ritta Slot, MD [facility] [phone]  If 7pm- 7am, please page neurology on call as listed in AMION. 02/07/2023  7:39 PM

## 2023-02-07 NOTE — Progress Notes (Signed)
Orthopedic Tech Progress Note Patient Details:  Kristen Parks 11/08/1940 [id number]  Patient ID: Mont Dutton, female   DOB: 07-21-1940, 82 y.o.   MRN: [id number] Pt. Already had knee immobilizer on right leg. Tonye Pearson 02/07/2023, 1:56 PM

## 2023-02-07 NOTE — ED Notes (Signed)
Code stroke called. Pt to bridge, Dr. Elayne Snare to bedside. Pt to CT scanner.

## 2023-02-07 NOTE — Sedation Documentation (Signed)
Pacu handoff with RN. Right femoral site and pulses verified with receiving nurse. Pulses palpable. Right fem site clean dry and intact. Right groin site verified as not having a hematoma with receiving nurse. All belongings with patient. Receiving RN denied having any other questions. Airway and IV patent. No s/s of discomfort or distress observed.

## 2023-02-07 NOTE — Anesthesia Postprocedure Evaluation (Signed)
Anesthesia Post Note  Patient: Kristen Parks  Procedure(s) Performed: RADIOLOGY WITH ANESTHESIA     Patient location during evaluation: PACU Anesthesia Type: General Level of consciousness: awake and alert Pain management: pain level controlled Vital Signs Assessment: post-procedure vital signs reviewed and stable Respiratory status: spontaneous breathing, nonlabored ventilation, respiratory function stable and patient connected to nasal cannula oxygen Cardiovascular status: blood pressure returned to baseline and stable Postop Assessment: no apparent nausea or vomiting Anesthetic complications: no   There were no known notable events for this encounter.  Last Vitals:  Vitals:   02/07/23 1330 02/07/23 1345  BP: 117/68 113/74  Pulse: (!) 59 60  Resp: (!) 9 12  Temp:  (!) 36.2 C  SpO2: 92% 93%    Last Pain:  Vitals:   02/07/23 1115  TempSrc: Oral                 Earl Lites P Jeralynn Vaquera

## 2023-02-07 NOTE — Procedures (Signed)
Cortrak  Tube Type:  Cortrak - 43 inches Tube Location:  Left nare Initial Placement:  Stomach Secured by: Bridle Technique Used to Measure Tube Placement:  Marking at nare/corner of mouth Cortrak Secured At:  70 cm   Cortrak Tube Team Note:  Consult received to place a Cortrak feeding tube.   X-ray is required, abdominal x-ray has been ordered by the Cortrak team. Please confirm tube placement before using the Cortrak tube.   If the tube becomes dislodged please keep the tube and contact the Cortrak team at [email or web address] for replacement.  If after hours and replacement cannot be delayed, place a NG tube and confirm placement with an abdominal x-ray.      MS, RD, LDN Please refer to AMION for RD and/or RD on-call/weekend/after hours pager   

## 2023-02-07 NOTE — Anesthesia Preprocedure Evaluation (Signed)
Anesthesia Evaluation  Preop documentation limited or incomplete due to emergent nature of procedure.  Airway Mallampati: I  TM Distance: >3 FB Neck ROM: Full    Dental  (+) Poor Dentition   Pulmonary former smoker   Pulmonary exam normal        Cardiovascular hypertension,  Rhythm:Regular Rate:Normal     Neuro/Psych  Headaches      Dementia CODE STROKE CVA    GI/Hepatic Neg liver ROS,GERD  Medicated,,  Endo/Other  Hypothyroidism    Renal/GU negative Renal ROS  negative genitourinary   Musculoskeletal  (+) Arthritis , Osteoarthritis,    Abdominal Normal abdominal exam  (+)   Peds  Hematology  (+) Blood dyscrasia, anemia   Anesthesia Other Findings   Reproductive/Obstetrics                             Anesthesia Physical Anesthesia Plan  ASA: 4 and emergent  Anesthesia Plan: General   Post-op Pain Management:    Induction: Intravenous and Rapid sequence  PONV Risk Score and Plan: 3 and Ondansetron and Treatment may vary due to age or medical condition  Airway Management Planned: Mask and Oral ETT  Additional Equipment: None  Intra-op Plan:   Post-operative Plan: Possible Post-op intubation/ventilation  Informed Consent:      Only emergency history available  Plan Discussed with: CRNA  Anesthesia Plan Comments:        Anesthesia Quick Evaluation

## 2023-02-07 NOTE — ED Provider Notes (Signed)
EMERGENCY DEPARTMENT AT [facility] Provider Note   CSN: [id number] Arrival date & time: 02/07/23  1014     History  Chief Complaint  Patient presents with   Altered Mental Status    Kristen Parks is a 82 y.o. female.  With a past history of of stroke, hypertension, hyperlipidemia and dementia who presents to the ED given concern for potential stroke.  Granddaughter reports patient was l last known well at 2200 last night.  Around 0830 this morning she was found to be confused with clenching of her right hand and left-sided gaze preference.  Confused speech unable to follow commands.  Additional history limited at this time secondary to clinical condition   Altered Mental Status      Home Medications Prior to Admission medications   Medication Sig Start Date End Date Taking? Authorizing Provider  aspirin EC 81 MG EC tablet Take 1 tablet (81 mg total) by mouth daily. 04/17/18   Elgergawy, Leana Roe, MD  feeding supplement, ENSURE ENLIVE, (ENSURE ENLIVE) LIQD Take 237 mLs by mouth 2 (two) times daily between meals. 04/16/18   Elgergawy, Leana Roe, MD  ferrous sulfate 325 (65 FE) MG EC tablet Take 1 tablet (325 mg total) by mouth 2 (two) times daily. 04/16/18 04/16/19  Elgergawy, Leana Roe, MD  gabapentin (NEURONTIN) 300 MG capsule Take 300 mg by mouth at bedtime.     [provider]  hydrOXYzine (ATARAX/VISTARIL) 25 MG tablet Take 25 mg by mouth at bedtime as needed for itching.    [provider]  levothyroxine (SYNTHROID, LEVOTHROID) 112 MCG tablet Take 112 mcg by mouth daily.     [provider]  Multiple Vitamin (MULTIVITAMIN WITH MINERALS) TABS tablet Take 1 tablet by mouth daily. 04/16/18   Elgergawy, Leana Roe, MD  omeprazole (PRILOSEC) 40 MG capsule Take 40 mg by mouth daily.    [provider]  simvastatin (ZOCOR) 20 MG tablet Take 20 mg by mouth at bedtime.     [provider]  vitamin B-12 (CYANOCOBALAMIN) 500 MCG  tablet Take 1 tablet (500 mcg total) by mouth daily. 04/16/18   Elgergawy, Leana Roe, MD      Allergies    Latex, Penicillins, and Sulfa antibiotics    Review of Systems   Review of Systems  Physical Exam Updated Vital Signs BP (!) 134/97   Pulse 68   Temp (!) 96.7 F (35.9 C) (Oral)   Resp 14   SpO2 100%  Physical Exam Vitals and nursing note reviewed.  HENT:     Head: Normocephalic and atraumatic.  Eyes:     Pupils: Pupils are equal, round, and reactive to light.  Cardiovascular:     Rate and Rhythm: Normal rate and regular rhythm.  Pulmonary:     Effort: Pulmonary effort is normal.     Breath sounds: Normal breath sounds.  Abdominal:     Palpations: Abdomen is soft.     Tenderness: There is no abdominal tenderness.  Skin:    General: Skin is warm and dry.  Neurological:     Mental Status: She is alert.     Comments: Awake alert Incoherent speech Gaze preference to left Clenched right upper extremity Unable to follow commands  Psychiatric:        Mood and Affect: Mood normal.     ED Results / Procedures / Treatments   Labs (all labs ordered are listed, but only abnormal results are displayed) Labs Reviewed  I-STAT  CHEM 8, ED - Abnormal; Notable for the following components:      Result Value   BUN 24 (*)    Glucose, Bld 155 (*)    TCO2 21 (*)    Hemoglobin 15.6 (*)    All other components within normal limits  CBG MONITORING, ED - Abnormal; Notable for the following components:   Glucose-Capillary 133 (*)    All other components within normal limits  SARS CORONAVIRUS 2 BY RT PCR  CBC  DIFFERENTIAL  PROTIME-INR  APTT  COMPREHENSIVE METABOLIC PANEL  ETHANOL    EKG None  Radiology CT HEAD CODE STROKE WO CONTRAST  Result Date: 02/07/2023 CLINICAL DATA:  Code stroke. Neuro deficit, acute, stroke suspected. Left visual preference and slurred speech. EXAM: CT HEAD WITHOUT CONTRAST TECHNIQUE: Contiguous axial images were obtained from the base of the  skull through the vertex without intravenous contrast. RADIATION DOSE REDUCTION: This exam was performed according to the departmental dose-optimization program which includes automated exposure control, adjustment of the mA and/or kV according to patient size and/or use of iterative reconstruction technique. COMPARISON:  Head CT 04/28/2021. FINDINGS: Brain: No acute hemorrhage. Possible new loss of gray-white differentiation along the posterior aspect of the left subfrontal gyrus (sagittal image 41 series 6). Background of severe chronic small-vessel disease with old infarct along the left middle frontal gyrus and old perforator infarcts in the bilateral thalami and left cerebellar hemisphere. No hydrocephalus or extra-axial collection. No mass effect or midline shift. Vascular: No hyperdense vessel or unexpected calcification. Skull: No calvarial fracture or suspicious bone lesion. Skull base is unremarkable. Sinuses/Orbits: No acute finding. Other: None. ASPECTS [facility] Stroke Program Early CT Score) - Ganglionic level infarction (caudate, lentiform nuclei, internal capsule, insula, M1-M3 cortex): 7 - Supraganglionic infarction (M4-M6 cortex): 2 Total score (0-10 with 10 being normal): 9 IMPRESSION: 1. Possible new loss of gray-white differentiation along the posterior aspect of the left subfrontal gyrus, which could represent an acute infarct. No acute hemorrhage. 2. Background of severe chronic small-vessel disease with old infarct along the left middle frontal gyrus and old perforator infarcts in the bilateral thalami and left cerebellar hemisphere. Code stroke imaging results were communicated on 02/07/2023 at 10:47 am to provider Dr. Amada Jupiter via secure text paging. Electronically Signed   By: Orvan Falconer M.D.   On: 02/07/2023 10:47    Procedures .Critical Care  Performed by: Royanne Foots, DO Authorized by: Royanne Foots, DO   Critical care provider statement:    Critical care time  (minutes):  48   Critical care time was exclusive of:  Separately billable procedures and treating other patients and teaching time   Critical care was necessary to treat or prevent imminent or life-threatening deterioration of the following conditions:  CNS failure or compromise   Critical care was time spent personally by me on the following activities:  Blood draw for specimens, ordering and performing treatments and interventions, discussions with consultants, development of treatment plan with patient or surrogate, re-evaluation of patient's condition, pulse oximetry, examination of patient, review of old charts and obtaining history from patient or surrogate   I assumed direction of critical care for this patient from another provider in my specialty: no     Care discussed with: admitting provider       Medications Ordered in ED Medications  sodium chloride flush (NS) 0.9 % injection 3 mL (has no administration in time range)  iohexol (OMNIPAQUE) 350 MG/ML injection 100 mL (100 mLs Intravenous Contrast  Given 02/07/23 1051)    ED Course/ Medical Decision Making/ A&P Clinical Course as of 02/07/23 1134  Fri Feb 07, 2023  1130 No intracranial hemorrhage.  CTA notable for left-sided large vessel occlusion.  Dr. Amada Jupiter (neurology) has discussed with neurointerventional radiology who agrees with plan for thrombectomy.  Patient's family members at bedside have been updated and escorted back to IR and agree with plan for thrombectomy after discussion of the risks and benefits with Dr. Amada Jupiter.  She will be admitted following this procedure [MP]    Clinical Course User Index [MP] Royanne Foots, DO                                 Medical Decision Making 82 year old female with history as above presenting given concern for acute stroke.  Last known well time of 2200 last night.  This morning was noted to have confused speech, left gaze preference and right upper extremity clinching  which is all new for her.  NIHSS of 19 as detailed below.  Point-of-care glucose within normal limits.  Hemodynamically stable.  No airway concerns.  Code stroke was activated  NIHSS:  1a Level of Conscious.: 0 1b LOC Questions: 2 1c LOC Commands: 2 2 Best Gaze: 2 3 Visual: 2 4 Facial Palsy: 2 5a Motor Arm - left: 0 5b Motor Arm - Right:2  6a Motor Leg - Left: 0 6b Motor Leg - Right: 1 7 Limb Ataxia: 0 8 Sensory: 1 9 Best Language: 2 10 Dysarthria: 2 11 Extinct. and Inatten.: 1 TOTAL: 19   Amount and/or Complexity of Data Reviewed Labs: ordered. Radiology: ordered.           Final Clinical Impression(s) / ED Diagnoses Final diagnoses:  Ischemic stroke [facility])    Rx / DC Orders ED Discharge Orders     None         Royanne Foots, DO 02/07/23 1134

## 2023-02-07 NOTE — Anesthesia Procedure Notes (Signed)
Procedure Name: Intubation Date/Time: 02/07/2023 11:38 AM  Performed by: Flonnie Hailstone, CRNAPre-anesthesia Checklist: Patient identified, Emergency Drugs available, Suction available and Patient being monitored Patient Re-evaluated:Patient Re-evaluated prior to induction Oxygen Delivery Method: Circle System Utilized Preoxygenation: Pre-oxygenation with 100% oxygen Induction Type: IV induction and Rapid sequence Laryngoscope Size: Mac and 3 Grade View: Grade I Tube type: Oral Tube size: 7.5 mm Number of attempts: 1 Airway Equipment and Method: Stylet Placement Confirmation: ETT inserted through vocal cords under direct vision, positive ETCO2 and breath sounds checked- equal and bilateral Secured at: 20 cm Tube secured with: Tape Dental Injury: Teeth and Oropharynx as per pre-operative assessment

## 2023-02-07 NOTE — Procedures (Signed)
INTERVENTIONAL NEURORADIOLOGY BRIEF POSTPROCEDURE NOTE  DIAGNOSTIC CEREBRAL ANGIOGRAM, MECHANICAL THROMBECTOMY, FLAT PANEL HEAD CT  Attending physician: Baldemar Lenis, MD  Diagnosis: Left pericallosal and left M3 occlusion.  Access site: Right common femoral artery.  Access closure: Perclose Prostyle.  Anesthesia: IR sedation: General endotracheal anesthesia.  Complications: Contrast extravasation, as detailed below.  Estimated blood loss:  40 mL.  Specimen: None.  Findings: Occlusion of the left A4/ACA and distal M3/MCA posterior division. Increased blush and early venous drainage in the left parietal region.   Mechanical thrombectomy performed in the left A4/ACA with direct contact aspiration achieving recanalization (TICI 3) despite a residual non occlusive filling defect. Patency was maintained in delayed runs.  Mechanical thrombectomy performed in the left M3/MCA with direct contact aspiration achieving recanalization with slow distal flow (TICI 2C). However, contrast extravasation was noted and the vessel was occluded with coils and glue at the M3 level (same level of occlusion prior to thrombectomy). No further contrast extravasation on follow up angiograms.  The patient vitals remained stable throughout intervention.   PLAN: - Bed rest x 6 hours - SBP 100-140 mmHg - follow-up imaging per Neurolgy

## 2023-02-07 NOTE — Progress Notes (Signed)
SLP Cancellation Note  Patient Details Name: Kristen Parks MRN: [id number] DOB: 1940-12-03   Cancelled treatment:       Reason Eval/Treat Not Completed: Fatigue/lethargy limiting ability to participate. Per RN, pt not yet demonstrating readiness to participate with therapies. Will continue to follow.    Gwynneth Aliment, M.A., CF-SLP Speech Language Pathology, Acute Rehabilitation Services  Secure Chat preferred [phone]  02/07/2023, 4:40 PM

## 2023-02-07 NOTE — Progress Notes (Signed)
Initial Nutrition Assessment  DOCUMENTATION CODES:   Severe malnutrition in context of social or environmental circumstances  INTERVENTION:   Initiate tube feeding via cortrak tube once placed: Osmolite 1.5 at 20 ml/h and increase by 10 ml every 8 hours to goal rate of 40 ml/h (960 ml per day) Prosource TF20 60 ml daily  Provides 1520 kcal, 80 gm protein, 729 ml free water daily  MVI daily   NUTRITION DIAGNOSIS:   Severe Malnutrition related to social / environmental circumstances (dementia) as evidenced by severe muscle depletion, severe fat depletion.  GOAL:   Patient will meet greater than or equal to 90% of their needs  MONITOR:   TF tolerance  REASON FOR ASSESSMENT:   Consult Enteral/tube feeding initiation and management  ASSESSMENT:   Pt with PMH of stroke, HTN, HLD, and dementia admitted with stroke s/p IR.   Weight pt on bedscale, 49.5 kg  Intake/Output Summary (Last 24 hours) at 02/07/2023 1707 Last data filed at 02/07/2023 1321 Gross per 24 hour  Intake 1050 ml  Output 1500 ml  Net -450 ml   Net IO Since Admission: -450 mL [02/07/23 1707]   Nutritionally Relevant Medications: Scheduled Meds:   stroke: early stages of recovery book   Does not apply Once   aspirin  300 mg Rectal Daily   Or   aspirin  325 mg Oral Daily   Chlorhexidine Gluconate Cloth  6 each Topical Daily   feeding supplement (PROSource TF20)  60 mL Per Tube Daily   multivitamin with minerals  1 tablet Per Tube Daily   sodium chloride flush  3 mL Intravenous Once   Continuous Infusions:  sodium chloride     clevidipine     feeding supplement (OSMOLITE 1.5 CAL)     sodium chloride     Labs Reviewed: HgbA1c 5.2    NUTRITION - FOCUSED PHYSICAL EXAM:  Flowsheet Row Most Recent Value  Orbital Region Severe depletion  Upper Arm Region Severe depletion  Thoracic and Lumbar Region Severe depletion  Buccal Region Severe depletion  Temple Region Severe depletion  Clavicle  Bone Region Severe depletion  Clavicle and Acromion Bone Region Severe depletion  Scapular Bone Region Severe depletion  Dorsal Hand Severe depletion  Patellar Region Severe depletion  Anterior Thigh Region Severe depletion  Posterior Calf Region Severe depletion  Edema (RD Assessment) None  Hair Reviewed  Eyes Reviewed  Mouth Reviewed  [missing teeth]  Skin Reviewed  Nails Reviewed       Diet Order:   Diet Order             Diet NPO time specified  Diet effective now                   EDUCATION NEEDS:   No education needs have been identified at this time  Skin:  Skin Assessment: Reviewed RN Assessment  Last BM:  10/25 large  Height:   Ht Readings from Last 1 Encounters:  04/28/21 5\' 2"  (1.575 m)    Weight:   Wt Readings from Last 1 Encounters:  02/07/23 49.5 kg    BMI:  Body mass index is 19.96 kg/m.  Estimated Nutritional Needs:   Kcal:  1500-1700  Protein:  75-85 grams  Fluid:  >1.5 L/day  Cammy Copa., RD, LDN, CNSC See AMiON for contact information

## 2023-02-08 ENCOUNTER — Inpatient Hospital Stay (HOSPITAL_COMMUNITY): Payer: Medicare HMO

## 2023-02-08 ENCOUNTER — Encounter (HOSPITAL_COMMUNITY): Payer: Self-pay | Admitting: Radiology

## 2023-02-08 DIAGNOSIS — I6389 Other cerebral infarction: Secondary | ICD-10-CM

## 2023-02-08 DIAGNOSIS — I6340 Cerebral infarction due to embolism of unspecified cerebral artery: Secondary | ICD-10-CM | POA: Diagnosis not present

## 2023-02-08 LAB — BASIC METABOLIC PANEL
Anion gap: 12 (ref 5–15)
BUN: 17 mg/dL (ref 8–23)
CO2: 18 mmol/L — ABNORMAL LOW (ref 22–32)
Calcium: 9.3 mg/dL (ref 8.9–10.3)
Chloride: 111 mmol/L (ref 98–111)
Creatinine, Ser: 0.73 mg/dL (ref 0.44–1.00)
GFR, Estimated: >60 mL/min (ref >60)
Glucose, Bld: 143 mg/dL — ABNORMAL HIGH (ref 70–99)
Potassium: 4.2 mmol/L (ref 3.5–5.1)
Sodium: 141 mmol/L (ref 135–145)

## 2023-02-08 LAB — ECHOCARDIOGRAM COMPLETE
AR max vel: 1.77 cm2
AV Peak grad: 6.1 mm[Hg]
Ao pk vel: 1.23 m/s
Area-P 1/2: 4.6 cm2
S' Lateral: 2.1 cm
Weight: 1756.63 [oz_av]

## 2023-02-08 LAB — PHOSPHORUS
Phosphorus: 2.4 mg/dL — ABNORMAL LOW (ref 2.5–4.6)
Phosphorus: 1.7 mg/dL — ABNORMAL LOW (ref 2.5–4.6)

## 2023-02-08 LAB — LIPID PANEL
Cholesterol: 157 mg/dL (ref 0–200)
HDL: 36 mg/dL — ABNORMAL LOW (ref >40)
LDL Cholesterol: 101 mg/dL — ABNORMAL HIGH (ref 0–99)
Total CHOL/HDL Ratio: 4.4 {ratio}
Triglycerides: 100 mg/dL (ref <150)
VLDL: 20 mg/dL (ref 0–40)

## 2023-02-08 LAB — CBC
HCT: 36.2 % (ref 36.0–46.0)
Hemoglobin: 12.4 g/dL (ref 12.0–15.0)
MCH: 32 pg (ref 26.0–34.0)
MCHC: 34.3 g/dL (ref 30.0–36.0)
MCV: 93.3 fL (ref 80.0–100.0)
Platelets: 210 10*3/uL (ref 150–400)
RBC: 3.88 MIL/uL (ref 3.87–5.11)
RDW: 12.6 % (ref 11.5–15.5)
WBC: 9.6 10*3/uL (ref 4.0–10.5)
nRBC: 0 % (ref 0.0–0.2)

## 2023-02-08 LAB — GLUCOSE, CAPILLARY
Glucose-Capillary: 127 mg/dL — ABNORMAL HIGH (ref 70–99)
Glucose-Capillary: 128 mg/dL — ABNORMAL HIGH (ref 70–99)
Glucose-Capillary: 130 mg/dL — ABNORMAL HIGH (ref 70–99)
Glucose-Capillary: 137 mg/dL — ABNORMAL HIGH (ref 70–99)
Glucose-Capillary: 166 mg/dL — ABNORMAL HIGH (ref 70–99)
Glucose-Capillary: 187 mg/dL — ABNORMAL HIGH (ref 70–99)

## 2023-02-08 LAB — MAGNESIUM
Magnesium: 1.8 mg/dL (ref 1.7–2.4)
Magnesium: 2 mg/dL (ref 1.7–2.4)

## 2023-02-08 MED ORDER — FREE WATER
100.0000 mL | Status: DC
  Administered 2023-02-08 (×2): 100 mL

## 2023-02-08 MED ORDER — ROSUVASTATIN CALCIUM 20 MG PO TABS
20.0000 mg | ORAL_TABLET | Freq: Every day | ORAL | Status: DC
  Administered 2023-02-08 – 2023-02-11 (×4): 20 mg
  Filled 2023-02-08 (×4): qty 1

## 2023-02-08 MED ORDER — HEPARIN SODIUM (PORCINE) 5000 UNIT/ML IJ SOLN
5000.0000 [IU] | Freq: Two times a day (BID) | INTRAMUSCULAR | Status: DC
  Administered 2023-02-08 – 2023-02-14 (×12): 5000 [IU] via SUBCUTANEOUS
  Filled 2023-02-08 (×12): qty 1

## 2023-02-08 MED ORDER — SODIUM PHOSPHATES 45 MMOLE/15ML IV SOLN
15.0000 mmol | Freq: Once | INTRAVENOUS | Status: AC
Start: 2023-02-09 — End: 2023-02-09
  Administered 2023-02-09: 15 mmol via INTRAVENOUS
  Filled 2023-02-08: qty 5

## 2023-02-08 MED ORDER — LEVOTHYROXINE SODIUM 112 MCG PO TABS
112.0000 ug | ORAL_TABLET | ORAL | Status: DC
  Administered 2023-02-09 – 2023-02-11 (×3): 112 ug
  Filled 2023-02-08 (×3): qty 1

## 2023-02-08 MED ORDER — FREE WATER
100.0000 mL | Freq: Four times a day (QID) | Status: DC
  Administered 2023-02-08 – 2023-02-10 (×6): 100 mL

## 2023-02-08 NOTE — Progress Notes (Signed)
Echocardiogram 2D Echocardiogram has been performed.  Lucendia Herrlich 02/08/2023, 9:01 AM

## 2023-02-08 NOTE — Evaluation (Signed)
Speech Language Pathology Evaluation Patient Details Name: Kristen Parks MRN: [id number] DOB: 1940-07-30 Today's Date: 02/08/2023 Time: 4008-6761 SLP Time Calculation (min) (ACUTE ONLY): 19 min  Problem List:  Patient Active Problem List   Diagnosis Date Noted   Stroke (cerebrum) (HCC) 02/07/2023   Female bladder prolapse 05/01/2018   Symptomatic anemia 04/14/2018   CVA (cerebral vascular accident) (HCC) 04/14/2018   Essential hypertension    Dyslipidemia    Dementia (HCC)    Acquired hypothyroidism    Past Medical History:  Past Medical History:  Diagnosis Date   Acquired hypothyroidism    Arthritis    "RLE" (04/15/2018)   CVA (cerebral vascular accident) (HCC) 04/14/2018   "don't feel any different and daughter doesn't notice any changes" (04/15/2018)   Dementia (HCC)    Dyslipidemia    Essential hypertension    Headache    "q couple months" (04/15/2018)   History of blood transfusion 04/14/2018   "low counts" (04/15/2018)   Migraine    "q couple months" (04/15/2018)   Peripheral neuropathy    Past Surgical History:  Past Surgical History:  Procedure Laterality Date   CARPAL TUNNEL RELEASE Left    CATARACT EXTRACTION, BILATERAL Bilateral    LAPAROSCOPIC CHOLECYSTECTOMY     RADIOLOGY WITH ANESTHESIA N/A 02/07/2023   Procedure: RADIOLOGY WITH ANESTHESIA;  Surgeon: Radiologist, Medication, MD;  Location: [facility] OR;  Service: Radiology;  Laterality: N/A;   HPI:  Pt is a 82 y.o. female who was brought into ED d/t  left-gaze preference, aphasia, dysarthria, right-sided weakness. CTH negative for hemorrhage, shows possible infarct posterior left subfrontal gyrus. CTA w/P shows: Left MCA M2 stenosis vs embolus with 12ml core infarct. Pt taken to IR for thrombectomy. Failed [facility] Swallow Screen 02/07/23. PMH significant for HTN, dyslipidemia, anemia, hypothyroidism, Left corpus collosum infarction 2019, chronic small vessel disease, dementia.   Assessment / Plan / Recommendation Clinical  Impression  Pt presents with receptive/expressive aphasia with suspected exacerbation of baseline cognitive deficits, s/p L CVA. L gaze preference noted, pt able to navigate to midline. Speech is marked by low vocal intensity and breathiness, with majority of verbalizations unintelligible. Spontaneous verbal output were primarily social responses such as, "I reckon", "alright", "ok", "no". When attempting confrontational/receptive naming tasks, y/n questions, following of commands, and production of automatic series, pt either became visually frustrated or began producing numbers in a random order ("4-5-9-5"). Her attention was fleeting requiring constant redirection to task and encouragement. Pt would benefit from ongoing SLP intervention at the acute level and upon discharge to focus on functional communication. Will f/u.    SLP Assessment  SLP Recommendation/Assessment: Patient needs continued Speech Lanaguage Pathology Services SLP Visit Diagnosis: Cognitive communication deficit (R41.841);Aphasia (R47.01)    Recommendations for follow up therapy are one component of a multi-disciplinary discharge planning process, led by the attending physician.  Recommendations may be updated based on patient status, additional functional criteria and insurance authorization.    Follow Up Recommendations  Acute inpatient rehab (3hours/day)    Assistance Recommended at Discharge  Frequent or constant Supervision/Assistance  Functional Status Assessment Patient has had a recent decline in their functional status and demonstrates the ability to make significant improvements in function in a reasonable and predictable amount of time.  Frequency and Duration min 2x/week  2 weeks      SLP Evaluation Cognition  Overall Cognitive Status: Difficult to assess Arousal/Alertness: Awake/alert Orientation Level:  (difficult to assess) Attention: Focused;Sustained Focused Attention: Appears intact Sustained  Attention: Impaired Sustained  Attention Impairment: Verbal basic;Functional basic Behaviors: Restless;Impulsive       Comprehension  Auditory Comprehension Overall Auditory Comprehension: Impaired Yes/No Questions: Impaired Basic Biographical Questions: 0-25% accurate Commands: Impaired One Step Basic Commands: 0-24% accurate Interfering Components: Attention    Expression Expression Primary Mode of Expression: Verbal Verbal Expression Overall Verbal Expression: Impaired Initiation: No impairment Automatic Speech: Counting;Social Response Level of Generative/Spontaneous Verbalization: Word;Phrase Naming: Impairment Confrontation: Impaired Written Expression Dominant Hand: Right   Oral / Motor  Oral Motor/Sensory Function Overall Oral Motor/Sensory Function: Mild impairment Facial ROM: Reduced right;Suspected CN VII (facial) dysfunction Facial Symmetry: Abnormal symmetry right;Suspected CN VII (facial) dysfunction Facial Strength: Reduced right;Suspected CN VII (facial) dysfunction Facial Sensation: Reduced right;Suspected CN V (Trigeminal) dysfunction Motor Speech Overall Motor Speech:  (difficult to assess) Phonation: Low vocal intensity Articulation: Impaired Intelligibility: Intelligibility reduced            [name], MA, CCC-SLP Acute Rehabilitation Services Office Number: [phone]   Paulette Blanch 02/08/2023, 1:58 PM

## 2023-02-08 NOTE — Progress Notes (Signed)
STROKE TEAM PROGRESS NOTE   SUBJECTIVE (INTERVAL HISTORY) Her daughter and granddaughter are at the bedside.  Overall her condition is stable.  She is lying in bed, still has aphasia and right-sided weakness.  Repeat CT showed small left sylvian fissure SAH.  On tube feeding.   OBJECTIVE Temp:  [97.2 F (36.2 C)-98.5 F (36.9 C)] 98.3 F (36.8 C) (10/26 1200) Pulse Rate:  [59-81] 81 (10/26 0700) Cardiac Rhythm: Normal sinus rhythm (10/26 0826) Resp:  [9-28] 19 (10/26 0700) BP: (108-135)/(59-110) 132/77 (10/26 0700) SpO2:  [92 %-98 %] 95 % (10/26 0700) Weight:  [49.5 kg-49.8 kg] 49.8 kg (10/26 0444)  Recent Labs  Lab 02/07/23 1942 02/07/23 2335 02/08/23 0323 02/08/23 0722 02/08/23 1120  GLUCAP 121* 145* 127* 166* 130*   Recent Labs  Lab 02/07/23 1045 02/07/23 1051 02/07/23 1821 02/08/23 0631  NA 144 144  --  141  K 3.6 3.6  --  4.2  CL 112* 110  --  111  CO2 22  --   --  18*  GLUCOSE 155* 155*  --  143*  BUN 20 24*  --  17  CREATININE 0.82 0.80  --  0.73  CALCIUM 10.0  --   --  9.3  MG  --   --  1.8 1.8  PHOS  --   --  2.5 2.4*   Recent Labs  Lab 02/07/23 1045  AST 20  ALT 11  ALKPHOS 180*  BILITOT 0.7  PROT 7.5  ALBUMIN 3.8   Recent Labs  Lab 02/07/23 1045 02/07/23 1051 02/08/23 0631  WBC 6.5  --  9.6  NEUTROABS 4.8  --   --   HGB 14.6 15.6* 12.4  HCT 44.2 46.0 36.2  MCV 94.4  --  93.3  PLT 291  --  210   No results for input(s): "CKTOTAL", "CKMB", "CKMBINDEX", "TROPONINI" in the last 168 hours. Recent Labs    02/07/23 1045  LABPROT 14.2  INR 1.1   No results for input(s): "COLORURINE", "LABSPEC", "PHURINE", "GLUCOSEU", "HGBUR", "BILIRUBINUR", "KETONESUR", "PROTEINUR", "UROBILINOGEN", "NITRITE", "LEUKOCYTESUR" in the last 72 hours.  Invalid input(s): "APPERANCEUR"     Component Value Date/Time   CHOL 157 02/08/2023 0630   TRIG 100 02/08/2023 0630   HDL 36 (L) 02/08/2023 0630   CHOLHDL 4.4 02/08/2023 0630   VLDL 20 02/08/2023 0630    LDLCALC 101 (H) 02/08/2023 0630   Lab Results  Component Value Date   HGBA1C 5.2 02/07/2023   No results found for: "LABOPIA", "COCAINSCRNUR", "LABBENZ", "AMPHETMU", "THCU", "LABBARB"  Recent Labs  Lab 02/07/23 1029  ETH <10    I have personally reviewed the radiological images below and agree with the radiology interpretations.  ECHOCARDIOGRAM COMPLETE  Result Date: 02/08/2023    ECHOCARDIOGRAM REPORT   Patient Name:   ANACAREN LEWI Date of Exam: 02/08/2023 Medical Rec #:  [id number]      Height:       62.0 in Accession #:    [id number]     Weight:       109.8 lb Date of Birth:  [birth year]       BSA:          1.482 m Patient Age:    82 years       BP:           132/77 mmHg Patient Gender: F              HR:  76 bpm. Exam Location:  Inpatient Procedure: 2D Echo, Cardiac Doppler and Color Doppler Indications:    Stroke 434.91/I163.9  History:        Patient has prior history of Echocardiogram examinations, most                 recent 04/15/2018. Stroke; Risk Factors:Hypertension and                 Dyslipidemia. Migraine.  Sonographer:    Lucendia Herrlich RCS Referring Phys: Amada Jupiter, MCNEILL, P  Sonographer Comments: Image acquisition challenging due to patient body habitus. IMPRESSIONS  1. Left ventricular ejection fraction, by estimation, is 55 to 60%. The left ventricle has normal function. The left ventricle has no regional wall motion abnormalities. Left ventricular diastolic parameters are consistent with Grade I diastolic dysfunction (impaired relaxation).  2. Right ventricular systolic function is normal. The right ventricular size is normal. Tricuspid regurgitation signal is inadequate for assessing PA pressure. The estimated right ventricular systolic pressure is 18.5 mmHg.  3. The mitral valve is grossly normal. No evidence of mitral valve regurgitation. No evidence of mitral stenosis.  4. The aortic valve is tricuspid. Aortic valve regurgitation is not visualized. No aortic  stenosis is present.  5. The inferior vena cava is normal in size with greater than 50% respiratory variability, suggesting right atrial pressure of 3 mmHg. Comparison(s): No significant change from prior study. FINDINGS  Left Ventricle: Left ventricular ejection fraction, by estimation, is 55 to 60%. The left ventricle has normal function. The left ventricle has no regional wall motion abnormalities. The left ventricular internal cavity size was normal in size. There is  no left ventricular hypertrophy. Left ventricular diastolic function could not be evaluated due to nondiagnostic images. Left ventricular diastolic parameters are consistent with Grade I diastolic dysfunction (impaired relaxation). Right Ventricle: The right ventricular size is normal. No increase in right ventricular wall thickness. Right ventricular systolic function is normal. Tricuspid regurgitation signal is inadequate for assessing PA pressure. The tricuspid regurgitant velocity is 1.97 m/s, and with an assumed right atrial pressure of 3 mmHg, the estimated right ventricular systolic pressure is 18.5 mmHg. Left Atrium: Left atrial size was normal in size. Right Atrium: Right atrial size was normal in size. Pericardium: There is no evidence of pericardial effusion. Mitral Valve: The mitral valve is grossly normal. No evidence of mitral valve regurgitation. No evidence of mitral valve stenosis. Tricuspid Valve: The tricuspid valve is grossly normal. Tricuspid valve regurgitation is trivial. No evidence of tricuspid stenosis. Aortic Valve: The aortic valve is tricuspid. Aortic valve regurgitation is not visualized. No aortic stenosis is present. Aortic valve peak gradient measures 6.1 mmHg. Pulmonic Valve: The pulmonic valve was grossly normal. Pulmonic valve regurgitation is not visualized. No evidence of pulmonic stenosis. Aorta: The aortic root and ascending aorta are structurally normal, with no evidence of dilitation. Venous: The inferior  vena cava is normal in size with greater than 50% respiratory variability, suggesting right atrial pressure of 3 mmHg. IAS/Shunts: The atrial septum is grossly normal.  LEFT VENTRICLE PLAX 2D LVIDd:         3.30 cm   Diastology LVIDs:         2.10 cm   LV e' medial:    4.62 cm/s LV PW:         0.90 cm   LV E/e' medial:  10.6 LV IVS:        0.90 cm   LV e' lateral:   9.01  cm/s LVOT diam:     2.00 cm   LV E/e' lateral: 5.4 LV SV:         41 LV SV Index:   28 LVOT Area:     3.14 cm  RIGHT VENTRICLE             IVC RV S prime:     14.90 cm/s  IVC diam: 1.80 cm TAPSE (M-mode): 1.8 cm LEFT ATRIUM           Index LA diam:      2.40 cm 1.62 cm/m LA Vol (A2C): 9.8 ml  6.59 ml/m  AORTIC VALVE AV Area (Vmax): 1.77 cm AV Vmax:        123.00 cm/s AV Peak Grad:   6.1 mmHg LVOT Vmax:      69.22 cm/s LVOT Vmean:     43.400 cm/s LVOT VTI:       0.130 m  AORTA Ao Root diam: 2.80 cm Ao Asc diam:  3.10 cm MITRAL VALVE               TRICUSPID VALVE MV Area (PHT): 4.60 cm    TR Peak grad:   15.5 mmHg MV Decel Time: 165 msec    TR Vmax:        197.00 cm/s MV E velocity: 49.00 cm/s MV A velocity: 67.40 cm/s  SHUNTS MV E/A ratio:  0.73        Systemic VTI:  0.13 m                            Systemic Diam: 2.00 cm Lennie Odor MD Electronically signed by Lennie Odor MD Signature Date/Time: 02/08/2023/9:48:49 AM    Final    CT HEAD WO CONTRAST ( )  Result Date: 02/08/2023 CLINICAL DATA:  82 year old female code stroke presentation yesterday. Left MCA abnormality, left ACA and MCA branch occlusions and endovascular reperfusion. Posterior left MCA branch extravasation and subsequent blue and coil embolization. EXAM: CT HEAD WITHOUT CONTRAST TECHNIQUE: Contiguous axial images were obtained from the base of the skull through the vertex without intravenous contrast. RADIATION DOSE REDUCTION: This exam was performed according to the departmental dose-optimization program which includes automated exposure control, adjustment of the mA  and/or kV according to patient size and/or use of iterative reconstruction technique. COMPARISON:  Head CT 1039 hours yesterday. FINDINGS: Brain: Small volume subarachnoid hemorrhage in the left sylvian fissure primarily. No intraventricular blood. No ventriculomegaly. No midline shift. Left MCA cytotoxic edema most apparent in the posterior division, lateral perirolandic area series 3, image 18. Additional confluent cytotoxic edema at the posterior ACA/MCA watershed, left vertex and parietal lobe series 3, image 23. Elsewhere gray-white differentiation appears stable including advanced chronic white matter hypodensity, chronic left cerebellar infarcts. No other acute intracranial hemorrhage identified. Basilar cisterns remain patent. Vascular: Posterior left MCA region embolization sequelae. Skull: Stable, intact. Sinuses/Orbits: Visualized paranasal sinuses and mastoids are stable and well aerated. Other: Left nasoenteric tube now in place. No acute orbit or scalp soft tissue finding. Left gaze persists. IMPRESSION: 1. Cytotoxic edema in the posterior left MCA and left distal ACA/MCA watershed area. Sequelae of posterior left MCA branch embolization. 2. Small volume acute subarachnoid hemorrhage, primarily in the left sylvian fissure. Heidelberg classification 3c: Subarachnoid hemorrhage. 3. No midline shift or significant intracranial mass effect. No IVH or ventriculomegaly. 4. Underlying chronic ischemia. Electronically Signed   By: Odessa Fleming M.D.   On: 02/08/2023 05:22  MR BRAIN WO CONTRAST  Result Date: 02/07/2023 CLINICAL DATA:  MCA branch occlusion status post thrombectomy EXAM: MRI HEAD WITHOUT CONTRAST TECHNIQUE: Multiplanar, multiecho pulse sequences of the brain and surrounding structures were obtained without intravenous contrast. COMPARISON:  Same-day CT/CTA head and neck FINDINGS: Brain: There is moderate volume patchy diffusion restriction in the left cerebral hemisphere involving the caudate  head, corona radiata, insula, parietal lobe, and occipital lobe primarily in the MCA and MCA watershed distributions. There is petechial hemorrhage in the parasagittal parietal lobe (12-45). There is FLAIR signal abnormality and SWI signal dropout in the basal cisterns extending to the left MCA cistern, sylvian fissure, and over the parietal lobe likely reflecting a combination of subarachnoid hemorrhage and contrast extravasation. There is no significant mass effect or midline shift. Background parenchymal volume is within expected limits for age. There is advanced background chronic small-vessel ischemic change with remote infarcts in the bilateral thalami and left cerebellar hemisphere. The pituitary and suprasellar region are grossly unremarkable. Vascular: The major flow voids are present. Skull and upper cervical spine: There is no definite marrow signal abnormality. Sinuses/Orbits: The paranasal sinuses are clear. Bilateral lens implants are in place. The globes and orbits are otherwise unremarkable. Other: The mastoid air cells and middle ear cavities are clear. IMPRESSION: 1. Moderate volume patchy acute infarcts in the left cerebral hemisphere primarily in the MCA and MCA watershed distributions with petechial hemorrhage in the parietal lobe but no significant mass effect or midline shift. 2. Probable combination of extravasated contrast and subarachnoid hemorrhage in the basal cisterns and over the left cerebral hemisphere. Recommend attention on subsequent head CTs. Electronically Signed   By: Lesia Hausen M.D.   On: 02/07/2023 18:30   DG Abd Portable 1V  Result Date: 02/07/2023 CLINICAL DATA:  Nasoenteric feeding tube placement EXAM: PORTABLE ABDOMEN - 1 VIEW COMPARISON:  None Available. FINDINGS: Nasoenteric feeding tube tip overlies the expected distal body of the stomach. The abdominal gas pattern is nonspecific due to a paucity of intra-abdominal gas. Cholecystectomy clips are seen in the right  upper quadrant. Contrast is seen within nondilated renal collecting systems bilaterally. IMPRESSION: 1. Nasoenteric feeding tube tip within the distal body of the stomach. Electronically Signed   By: Helyn Numbers M.D.   On: 02/07/2023 17:59   CT ANGIO HEAD NECK W WO CM W PERF (CODE STROKE)  Result Date: 02/07/2023 CLINICAL DATA:  Neuro deficit, acute, stroke suspected. Left-sided visual preference and slurred speech. EXAM: CT ANGIOGRAPHY HEAD AND NECK CT PERFUSION BRAIN TECHNIQUE: Multidetector CT imaging of the head and neck was performed using the standard protocol during bolus administration of intravenous contrast. Multiplanar CT image reconstructions and MIPs were obtained to evaluate the vascular anatomy. Carotid stenosis measurements (when applicable) are obtained utilizing NASCET criteria, using the distal internal carotid diameter as the denominator. Multiphase CT imaging of the brain was performed following IV bolus contrast injection. Subsequent parametric perfusion maps were calculated using RAPID software. RADIATION DOSE REDUCTION: This exam was performed according to the departmental dose-optimization program which includes automated exposure control, adjustment of the mA and/or kV according to patient size and/or use of iterative reconstruction technique. CONTRAST:  OMNIPAQUE IOHEXOL 350 MG/ML SOLN COMPARISON:  Head CT 02/07/2023. CTA neck 04/15/2018. MRI/MRA head 04/14/2018. FINDINGS: CTA NECK FINDINGS Aortic arch: Three-vessel arch configuration. Arch vessel origins are patent. Right carotid system: No evidence of dissection, stenosis (50% or greater) or occlusion. Left carotid system: No evidence of dissection, stenosis (50% or greater) or  occlusion. Vertebral arteries: Left dominant. No evidence of dissection, stenosis (50% or greater) or occlusion. Skeleton: Mild cervical spondylosis without high-grade spinal canal stenosis. 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 Upper chest: 11 mm solid nodule in the superior segment of the left lower lobe (axial image 186 series 5). Review of the MIP images confirms the above findings CTA HEAD FINDINGS Anterior circulation: Intracranial ICAs are patent without stenosis or aneurysm. Proximal high-grade stenosis versus subocclusive embolus of the left MCA superior M2 division with distal reconstitution (axial images 98-103 series 8), new from the prior MRA. Distal branches are symmetric. Posterior circulation: Normal basilar artery. The SCAs, AICAs and PICAs are patent proximally. The PCAs are patent proximally without stenosis or aneurysm. Distal branches are symmetric. Venous sinuses: Early phase of contrast. Anatomic variants: Hypoplastic right A1 segment. Azygous A2 segment. Persistent fetal origin of the right PCA with hypoplastic right P1 segment. Review of the MIP images confirms the above findings CT Brain Perfusion Findings: CBF (<30%) Volume: 12mL estimated by automated post processing. However, given significant motion during the first pass arterial bolus, this may be an overestimate. Perfusion (Tmax>6.0s) volume: 72mL estimated by automated post processing. However, given significant motion during the first pass arterial bolus, this may be an overestimate. Mismatch Volume: 60mL Infarction Location:Left frontoparietal junction, MCA territory. IMPRESSION: 1. Proximal high-grade stenosis versus subocclusive embolus of the left MCA superior M2 division with distal reconstitution, new from the prior MRA. 2. CT perfusion demonstrates a 12 mL core infarct in the left frontoparietal junction, MCA territory. However, given significant motion during the first pass arterial bolus, this may be an overestimate. 3. No hemodynamically significant stenosis in the neck. 4. 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 5. A 11 mm solid nodule in the superior segment of the left lower lobe. Consider one of the following in 3 months for both low-risk and high-risk individuals: (a) repeat chest CT, (b) follow-up PET-CT, or (c) tissue sampling. This recommendation follows the consensus statement: Guidelines for Management of Incidental Pulmonary Nodules Detected on CT Images: From the Fleischner Society 2017; Radiology 2017; 284:228-243. Code stroke imaging results were communicated on 02/07/2023 at 11:17 am to provider Dr. Amada Jupiter via telephone, who verbally acknowledged these results. Electronically Signed   By: Orvan Falconer M.D.   On: 02/07/2023 11:32   CT HEAD CODE STROKE WO CONTRAST  Result Date: 02/07/2023 CLINICAL DATA:  Code stroke. Neuro deficit, acute, stroke suspected. Left visual preference and slurred speech. EXAM: CT HEAD WITHOUT CONTRAST TECHNIQUE: Contiguous axial images were obtained from the base of the skull through the vertex without intravenous contrast. RADIATION DOSE REDUCTION: This exam was performed according to the departmental dose-optimization program which includes automated exposure control, adjustment of the mA and/or kV according to patient size and/or use of iterative reconstruction technique. COMPARISON:  Head CT 04/28/2021. FINDINGS: Brain: No acute hemorrhage. Possible new loss of gray-white differentiation along the posterior aspect of the left subfrontal gyrus (sagittal image 41 series 6). Background of severe chronic small-vessel disease with old infarct along the left middle frontal gyrus and old perforator infarcts in the bilateral thalami and left cerebellar hemisphere. No hydrocephalus or extra-axial collection. No mass effect or midline shift. Vascular: No hyperdense vessel or unexpected calcification. Skull: No calvarial fracture or suspicious bone lesion. Skull base  is unremarkable. Sinuses/Orbits: No acute  finding. Other: None. ASPECTS [facility] Stroke Program Early CT Score) - Ganglionic level infarction (caudate, lentiform nuclei, internal capsule, insula, M1-M3 cortex): 7 - Supraganglionic infarction (M4-M6 cortex): 2 Total score (0-10 with 10 being normal): 9 IMPRESSION: 1. Possible new loss of gray-white differentiation along the posterior aspect of the left subfrontal gyrus, which could represent an acute infarct. No acute hemorrhage. 2. Background of severe chronic small-vessel disease with old infarct along the left middle frontal gyrus and old perforator infarcts in the bilateral thalami and left cerebellar hemisphere. Code stroke imaging results were communicated on 02/07/2023 at 10:47 am to provider Dr. Amada Jupiter via secure text paging. Electronically Signed   By: Orvan Falconer M.D.   On: 02/07/2023 10:47     PHYSICAL EXAM  Temp:  [97.2 F (36.2 C)-98.5 F (36.9 C)] 98.3 F (36.8 C) (10/26 1200) Pulse Rate:  [59-81] 81 (10/26 0700) Resp:  [9-28] 19 (10/26 0700) BP: (108-135)/(59-110) 132/77 (10/26 0700) SpO2:  [92 %-98 %] 95 % (10/26 0700) Weight:  [49.5 kg-49.8 kg] 49.8 kg (10/26 0444)  General - Well nourished, well developed, in no apparent distress.  Ophthalmologic - fundi not visualized due to noncooperation.  Cardiovascular - Regular rhythm and rate.  Neuro - drowsy sleepy but easily arousable and able to maintain awakefulness. Pt eyes open, global aphasia, not following commands, able to have sounds out but not meaningful. Left gaze preference, barely cross midline, tracking on the left but not on the right, right hemianopia vs. Visual neglect, PERRL. Right facial droop. Tongue protrusion not cooperative. LUE at least 3+/5 and LLE at least 3/5, RUE and RLE slight against gravity on painful stimuli, however, she did  have spontaneous movement of RUE and RLE against gravity by observation. Sensation, coordination and gait not tested.    ASSESSMENT/PLAN Ms. ROSINA DEMARINIS is  a 82 y.o. female with history of hypertension, hyperlipidemia, stroke in 2019 admitted for slurred speech, aphasia, left gaze and right-sided weakness. No tPA given due to outside window.    Stroke:  left MCA and ACA infarcts, embolic pattern, likely secondary to cardioembolic source CT head no acute infarct, questionable left frontal early sign of stroke CT head and neck left M2 high-grade stenosis versus near occlusion CTP 12/72 Status post IR with left A4 occlusion with TICI3 reperfusion, left M3 occlusion with TICI2c complicated by extravasation status post coil embolization MRI left MCA and ACA infarcts with hemorrhagic transformation and questionable SAH CT 12/26 left MCA and left distal ACA/MCA watershed area infarcts.  Small volume acute SAH, primary in the left sylvian fissure 2D Echo EF 55 to 60% LE venous Doppler pending Will consider loop recorder if neuro significantly improved LDL 101 HgbA1c 5.2 Heparin subcu for VTE prophylaxis aspirin 81 mg daily prior to admission, now on No antithrombotic given HT with SAH Ongoing aggressive stroke risk factor management Therapy recommendations:  CIR Disposition: Pending  History of stroke 03/2018 admitted for small left CC and cingulate gyrus infarct left ACA territory.  MRA head negative.  CTA neck on markable.  LDL 39, A1c 5.1.  EF 60 to 60%.  Had anemia with PRBC transfusion.  Discharged on aspirin and Zocor 20. Outpatient 30-day CardioNet monitor negative for A-fib.  Hypertension Stable BP goal less than 160 given SAH Long term BP goal normotensive  Hyperlipidemia Home meds: None LDL 101, goal < 70 Now on Crestor 20 Continue statin at discharge  Dysphagia Dys 2 diet with thin liquid Still  has tube feeding @40cc  Encourage p.o. intake  Other Stroke Risk Factors Advanced age  Other Active Problems   Hospital day # 1  This patient is critically ill due to stroke with left ACA and MCA occlusion status post IR  complicated by [facility] and at significant risk of neurological worsening, death form recurrent stroke, hemorrhagic conversion, vasospasm, seizure. This patient's care requires constant monitoring of vital signs, hemodynamics, respiratory and cardiac monitoring, review of multiple databases, neurological assessment, discussion with family, other specialists and medical decision making of high complexity. I spent 40 minutes of neurocritical care time in the care of this patient. I had long discussion with daughter at bedside, updated pt current condition, treatment plan and potential prognosis, and answered all the questions.  She expressed understanding and appreciation.    Marvel Plan, MD PhD Stroke Neurology 02/08/2023 12:27 PM    To contact Stroke Continuity provider, please refer to [email or web address]. After hours, contact General Neurology

## 2023-02-08 NOTE — Progress Notes (Signed)
Referring Physician(s): Amada Jupiter, M  Supervising Physician: Baldemar Lenis  Patient Status:  [facility] - In-pt  Chief Complaint:  Code stroke, aphasia and right sided weakness L ACA/MCA occlusion  S/p L ACA and MAC mechanical thrombectomy by Dr. Tommie Sams on 10/25   Subjective:  Patient laying in bed, sleeping. Family members at bedside. Family states that she has been moving her right arm, she had to get a mitten because she tried to pull NGT.  She is still having difficulty speaking per family.   Allergies: Latex, Penicillins, and Sulfa antibiotics  Medications: Prior to Admission medications   Medication Sig Start Date End Date Taking? Authorizing Provider  aspirin EC 81 MG EC tablet Take 1 tablet (81 mg total) by mouth daily. 04/17/18  Yes Elgergawy, Leana Roe, MD  levothyroxine (SYNTHROID, LEVOTHROID) 112 MCG tablet Take 112 mcg by mouth daily.    Yes [provider]     Vital Signs: BP 132/77   Pulse 81   Temp 98.3 F (36.8 C) (Axillary)   Resp 19   Wt 109 lb 12.6 oz (49.8 kg)   SpO2 95%   BMI 20.08 kg/m   Physical Exam Vitals reviewed.  Constitutional:      General: She is not in acute distress.    Appearance: She is not ill-appearing.     Comments: Sleeping, but easily arousable.   HENT:     Head: Normocephalic and atraumatic.     Nose:     Comments: DHT  Cardiovascular:     Rate and Rhythm: Normal rate and regular rhythm.  Pulmonary:     Effort: Pulmonary effort is normal.  Abdominal:     General: Bowel sounds are normal.     Palpations: Abdomen is soft.  Musculoskeletal:     Cervical back: Neck supple.  Skin:    General: Skin is warm and dry.     Coloration: Skin is not jaundiced or pale.     Comments: Positive dressing on R CFA puncture site. Site is unremarkable with no erythema, edema, tenderness, bleeding or drainage. Minimal amount of old, dry blood noted on the dressing. Dressing otherwise clean, dry, and  intact. R DP 1+      Imaging: CT HEAD WO CONTRAST ( )  Result Date: 02/08/2023 CLINICAL DATA:  82 year old female code stroke presentation yesterday. Left MCA abnormality, left ACA and MCA branch occlusions and endovascular reperfusion. Posterior left MCA branch extravasation and subsequent blue and coil embolization. EXAM: CT HEAD WITHOUT CONTRAST TECHNIQUE: Contiguous axial images were obtained from the base of the skull through the vertex without intravenous contrast. RADIATION DOSE REDUCTION: This exam was performed according to the departmental dose-optimization program which includes automated exposure control, adjustment of the mA and/or kV according to patient size and/or use of iterative reconstruction technique. COMPARISON:  Head CT 1039 hours yesterday. FINDINGS: Brain: Small volume subarachnoid hemorrhage in the left sylvian fissure primarily. No intraventricular blood. No ventriculomegaly. No midline shift. Left MCA cytotoxic edema most apparent in the posterior division, lateral perirolandic area series 3, image 18. Additional confluent cytotoxic edema at the posterior ACA/MCA watershed, left vertex and parietal lobe series 3, image 23. Elsewhere gray-white differentiation appears stable including advanced chronic white matter hypodensity, chronic left cerebellar infarcts. No other acute intracranial hemorrhage identified. Basilar cisterns remain patent. Vascular: Posterior left MCA region embolization sequelae. Skull: Stable, intact. Sinuses/Orbits: Visualized paranasal sinuses and mastoids are stable and well aerated. Other: Left nasoenteric tube now in place.  No acute orbit or scalp soft tissue finding. Left gaze persists. IMPRESSION: 1. Cytotoxic edema in the posterior left MCA and left distal ACA/MCA watershed area. Sequelae of posterior left MCA branch embolization. 2. Small volume acute subarachnoid hemorrhage, primarily in the left sylvian fissure. Heidelberg classification 3c:  Subarachnoid hemorrhage. 3. No midline shift or significant intracranial mass effect. No IVH or ventriculomegaly. 4. Underlying chronic ischemia. Electronically Signed   By: Odessa Fleming M.D.   On: 02/08/2023 05:22   MR BRAIN WO CONTRAST  Result Date: 02/07/2023 CLINICAL DATA:  MCA branch occlusion status post thrombectomy EXAM: MRI HEAD WITHOUT CONTRAST TECHNIQUE: Multiplanar, multiecho pulse sequences of the brain and surrounding structures were obtained without intravenous contrast. COMPARISON:  Same-day CT/CTA head and neck FINDINGS: Brain: There is moderate volume patchy diffusion restriction in the left cerebral hemisphere involving the caudate head, corona radiata, insula, parietal lobe, and occipital lobe primarily in the MCA and MCA watershed distributions. There is petechial hemorrhage in the parasagittal parietal lobe (12-45). There is FLAIR signal abnormality and SWI signal dropout in the basal cisterns extending to the left MCA cistern, sylvian fissure, and over the parietal lobe likely reflecting a combination of subarachnoid hemorrhage and contrast extravasation. There is no significant mass effect or midline shift. Background parenchymal volume is within expected limits for age. There is advanced background chronic small-vessel ischemic change with remote infarcts in the bilateral thalami and left cerebellar hemisphere. The pituitary and suprasellar region are grossly unremarkable. Vascular: The major flow voids are present. Skull and upper cervical spine: There is no definite marrow signal abnormality. Sinuses/Orbits: The paranasal sinuses are clear. Bilateral lens implants are in place. The globes and orbits are otherwise unremarkable. Other: The mastoid air cells and middle ear cavities are clear. IMPRESSION: 1. Moderate volume patchy acute infarcts in the left cerebral hemisphere primarily in the MCA and MCA watershed distributions with petechial hemorrhage in the parietal lobe but no significant  mass effect or midline shift. 2. Probable combination of extravasated contrast and subarachnoid hemorrhage in the basal cisterns and over the left cerebral hemisphere. Recommend attention on subsequent head CTs. Electronically Signed   By: Lesia Hausen M.D.   On: 02/07/2023 18:30   DG Abd Portable 1V  Result Date: 02/07/2023 CLINICAL DATA:  Nasoenteric feeding tube placement EXAM: PORTABLE ABDOMEN - 1 VIEW COMPARISON:  None Available. FINDINGS: Nasoenteric feeding tube tip overlies the expected distal body of the stomach. The abdominal gas pattern is nonspecific due to a paucity of intra-abdominal gas. Cholecystectomy clips are seen in the right upper quadrant. Contrast is seen within nondilated renal collecting systems bilaterally. IMPRESSION: 1. Nasoenteric feeding tube tip within the distal body of the stomach. Electronically Signed   By: Helyn Numbers M.D.   On: 02/07/2023 17:59   CT ANGIO HEAD NECK W WO CM W PERF (CODE STROKE)  Result Date: 02/07/2023 CLINICAL DATA:  Neuro deficit, acute, stroke suspected. Left-sided visual preference and slurred speech. EXAM: CT ANGIOGRAPHY HEAD AND NECK CT PERFUSION BRAIN TECHNIQUE: Multidetector CT imaging of the head and neck was performed using the standard protocol during bolus administration of intravenous contrast. Multiplanar CT image reconstructions and MIPs were obtained to evaluate the vascular anatomy. Carotid stenosis measurements (when applicable) are obtained utilizing NASCET criteria, using the distal internal carotid diameter as the denominator. Multiphase CT imaging of the brain was performed following IV bolus contrast injection. Subsequent parametric perfusion maps were calculated using RAPID software. RADIATION DOSE REDUCTION: This exam was performed according to  the departmental dose-optimization program which includes automated exposure control, adjustment of the mA and/or kV according to patient size and/or use of iterative reconstruction  technique. CONTRAST:  OMNIPAQUE IOHEXOL 350 MG/ML SOLN COMPARISON:  Head CT 02/07/2023. CTA neck 04/15/2018. MRI/MRA head 04/14/2018. FINDINGS: CTA NECK FINDINGS Aortic arch: Three-vessel arch configuration. Arch vessel origins are patent. Right carotid system: No evidence of dissection, stenosis (50% or greater) or occlusion. Left carotid system: No evidence of dissection, stenosis (50% or greater) or occlusion. Vertebral arteries: Left dominant. No evidence of dissection, stenosis (50% or greater) or occlusion. Skeleton: Mild cervical spondylosis without high-grade spinal canal stenosis. 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 Upper chest: 11 mm solid nodule in the superior segment of the left lower lobe (axial image 186 series 5). Review of the MIP images confirms the above findings CTA HEAD FINDINGS Anterior circulation: Intracranial ICAs are patent without stenosis or aneurysm. Proximal high-grade stenosis versus subocclusive embolus of the left MCA superior M2 division with distal reconstitution (axial images 98-103 series 8), new from the prior MRA. Distal branches are symmetric. Posterior circulation: Normal basilar artery. The SCAs, AICAs and PICAs are patent proximally. The PCAs are patent proximally without stenosis or aneurysm. Distal branches are symmetric. Venous sinuses: Early phase of contrast. Anatomic variants: Hypoplastic right A1 segment. Azygous A2 segment. Persistent fetal origin of the right PCA with hypoplastic right P1 segment. Review of the MIP images confirms the above findings CT Brain Perfusion Findings: CBF (<30%) Volume: 12mL estimated by automated post processing. However, given significant motion during the first pass arterial bolus, this may be an overestimate. Perfusion (Tmax>6.0s) volume: 72mL estimated by automated post processing. However,  given significant motion during the first pass arterial bolus, this may be an overestimate. Mismatch Volume: 60mL Infarction Location:Left frontoparietal junction, MCA territory. IMPRESSION: 1. Proximal high-grade stenosis versus subocclusive embolus of the left MCA superior M2 division with distal reconstitution, new from the prior MRA. 2. CT perfusion demonstrates a 12 mL core infarct in the left frontoparietal junction, MCA territory. However, given significant motion during the first pass arterial bolus, this may be an overestimate. 3. No hemodynamically significant stenosis in the neck. 4. 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 5. A 11 mm solid nodule in the superior segment of the left lower lobe. Consider one of the following in 3 months for both low-risk and high-risk individuals: (a) repeat chest CT, (b) follow-up PET-CT, or (c) tissue sampling. This recommendation follows the consensus statement: Guidelines for Management of Incidental Pulmonary Nodules Detected on CT Images: From the Fleischner Society 2017; Radiology 2017; 284:228-243. Code stroke imaging results were communicated on 02/07/2023 at 11:17 am to provider Dr. Amada Jupiter via telephone, who verbally acknowledged these results. Electronically Signed   By: Orvan Falconer M.D.   On: 02/07/2023 11:32   CT HEAD CODE STROKE WO CONTRAST  Result Date: 02/07/2023 CLINICAL DATA:  Code stroke. Neuro deficit, acute, stroke suspected. Left visual preference and slurred speech. EXAM: CT HEAD WITHOUT CONTRAST TECHNIQUE: Contiguous axial images were obtained from the base of the skull through the vertex without intravenous contrast. RADIATION DOSE REDUCTION: This exam was performed according to the departmental dose-optimization program which includes automated exposure control, adjustment of the mA and/or kV according to patient size and/or use of iterative  reconstruction technique. COMPARISON:  Head CT 04/28/2021. FINDINGS: Brain: No acute hemorrhage. Possible new loss of gray-white differentiation along the posterior aspect of the left subfrontal gyrus (sagittal image 41 series 6). Background of severe chronic small-vessel disease with old infarct along the left middle frontal gyrus and old perforator infarcts in the bilateral thalami and left cerebellar hemisphere. No hydrocephalus or extra-axial collection. No mass effect or midline shift. Vascular: No hyperdense vessel or unexpected calcification. Skull: No calvarial fracture or suspicious bone lesion. Skull base is unremarkable. Sinuses/Orbits: No acute finding. Other: None. ASPECTS [facility] Stroke Program Early CT Score) - Ganglionic level infarction (caudate, lentiform nuclei, internal capsule, insula, M1-M3 cortex): 7 - Supraganglionic infarction (M4-M6 cortex): 2 Total score (0-10 with 10 being normal): 9 IMPRESSION: 1. Possible new loss of gray-white differentiation along the posterior aspect of the left subfrontal gyrus, which could represent an acute infarct. No acute hemorrhage. 2. Background of severe chronic small-vessel disease with old infarct along the left middle frontal gyrus and old perforator infarcts in the bilateral thalami and left cerebellar hemisphere. Code stroke imaging results were communicated on 02/07/2023 at 10:47 am to provider Dr. Amada Jupiter via secure text paging. Electronically Signed   By: Orvan Falconer M.D.   On: 02/07/2023 10:47    Labs:  CBC: Recent Labs    02/07/23 1045 02/07/23 1051 02/08/23 0631  WBC 6.5  --  9.6  HGB 14.6 15.6* 12.4  HCT 44.2 46.0 36.2  PLT 291  --  210    COAGS: Recent Labs    02/07/23 1045  INR 1.1  APTT 26    BMP: Recent Labs    02/07/23 1045 02/07/23 1051 02/08/23 0631  NA 144 144 141  K 3.6 3.6 4.2  CL 112* 110 111  CO2 22  --  18*  GLUCOSE 155* 155* 143*  BUN 20 24* 17  CALCIUM 10.0  --  9.3  CREATININE 0.82  0.80 0.73  GFRNONAA >60  --  >60    LIVER FUNCTION TESTS: Recent Labs    02/07/23 1045  BILITOT 0.7  AST 20  ALT 11  ALKPHOS 180*  PROT 7.5  ALBUMIN 3.8    Assessment and Plan:  82 y.o. female with HTN, dyslipidemia, anemia, hypothyroidism, Left corpus collosum infarction 2019, chronic small vessel disease who developed aphasia and right sided weakness, brought to ED as Code Stroke, found to have L ACA/MCA occlusion, she is s/p L ACA/MCA mechanical thrombectomy by Dr. Pershing Proud on 10/25. (TICI 3 for the ACA, TICI 2 for the MCA. Contrast extravasation was noted and the vessel was occluded with coils and glue at the M3 level.)   CBC wnl  RF wnl  VSS R CFA puncture site with no appreciable hematoma or pseudoaneurysm  R DP 1+  Able to move all 4 per family, still has difficulty speaking.   Further treatment plan per Stroke team.  Appreciate and agree with the plan.  Please call IR for questions and concerns.   Electronically Signed: Willette Brace, PA-C 02/08/2023, 9:03 AM   I spent a total of 15 Minutes at the the patient's bedside AND on the patient's hospital floor or unit, greater than 50% of which was counseling/coordinating care for stroke f/u.   This chart was dictated using voice recognition software.  Despite best efforts to proofread,  errors can occur which can change the documentation meaning.

## 2023-02-08 NOTE — Evaluation (Signed)
Physical Therapy Evaluation Patient Details Name: Kristen Parks MRN: [id number] DOB: June 03, 1940 Today's Date: 02/08/2023  History of Present Illness  Kristen Parks is a 82 y.o. female with PMH significant for HTN, dyslipidemia, anemia, hypothyroidism, Left corpus collosum infarction 2019, chronic small vessel disease who was brought into ED d/t  left-gaze preference, aphasia, dysarthria, right-sided weakness. CTH negative for hemorrhage, shows possible infarct posterior left subfrontal gyrus. CTA w/P shows: Left MCA M2 stenosis vs embolus with 12ml core infarct. Patient taken to IR for thrombectomy.   Clinical Impression  Pt admitted with above diagnosis. PTA pt lived at home with daughter and granddaughter, mod I mobility/ADLs without AD. Pt currently with functional limitations due to the deficits listed below (see PT Problem List). On eval, pt required mod assist bed mobility, and +2 HH/mod assist sit to stand. She demo poor sitting balance and zero standing balance. L gaze preference. Unable to cross midline visually. Soft, mumbling, nonsensical speech. Pt will benefit from acute skilled PT to increase their independence and safety with mobility to allow discharge.  Pt has good family support. Upon d/c, pt would benefit from further therapy in inpatient setting. She demonstrates good ability to tolerate > 3 hours/day.          If plan is discharge home, recommend the following: A lot of help with walking and/or transfers;A lot of help with bathing/dressing/bathroom;Assist for transportation;Help with stairs or ramp for entrance;Direct supervision/assist for medications management;Assistance with cooking/housework;Supervision due to cognitive status   Can travel by private vehicle        Equipment Recommendations Other (comment) (TBD)  Recommendations for Other Services  Rehab consult    Functional Status Assessment Patient has had a recent decline in their functional status and  demonstrates the ability to make significant improvements in function in a reasonable and predictable amount of time.     Precautions / Restrictions Precautions Precautions: Fall;Other (comment) Precaution Comments: cortrak Restrictions Weight Bearing Restrictions: No      Mobility  Bed Mobility Overal bed mobility: Needs Assistance Bed Mobility: Supine to Sit, Sit to Supine     Supine to sit: Mod assist, HOB elevated Sit to supine: Mod assist   General bed mobility comments: increased time, verbal/tactile cues for sequencing, assist with BLE and trunk    Transfers Overall transfer level: Needs assistance Equipment used: 2 person hand held assist Transfers: Sit to/from Stand Sit to Stand: Mod assist, +2 physical assistance           General transfer comment: sit to stand from EOB, providing support at gait belt and bed pad under hips. R knee blocked as a precaution but no buckling noted. Bilat distal LE appeared to be braced on bed. Pt maintained flexed posture.    Ambulation/Gait                  Stairs            Wheelchair Mobility     Tilt Bed    Modified Rankin (Stroke Patients Only) Modified Rankin (Stroke Patients Only) Pre-Morbid Rankin Score: Slight disability Modified Rankin: Severe disability     Balance Overall balance assessment: Needs assistance Sitting-balance support: Feet supported, Bilateral upper extremity supported Sitting balance-Leahy Scale: Poor     Standing balance support: Bilateral upper extremity supported, During functional activity Standing balance-Leahy Scale: Zero Standing balance comment: heavy reliance on external support  Pertinent Vitals/Pain Pain Assessment Pain Assessment: Faces Faces Pain Scale: No hurt    Home Living Family/patient expects to be discharged to:: Private residence Living Arrangements: Children Available Help at Discharge: Family;Available 24  hours/day Type of Home: House Home Access: Stairs to enter Entrance Stairs-Rails: Left Entrance Stairs-Number of Steps: 7 (split level)   Home Layout: Two level;Able to live on main level with bedroom/bathroom Home Equipment: Rolling Walker (2 wheels);Cane - single point      Prior Function Prior Level of Function : Independent/Modified Independent             Mobility Comments: occasional use of cane ADLs Comments: assist to get in/out of tub/shower     Extremity/Trunk Assessment   Upper Extremity Assessment Upper Extremity Assessment:  (increased tone in flexion synergy pattern noted RUE when pt yawned)    Lower Extremity Assessment Lower Extremity Assessment: Generalized weakness (active movement noted bilat. Unable to MMT due to poor command follow.)    Cervical / Trunk Assessment Cervical / Trunk Assessment: Kyphotic  Communication   Communication Communication: Difficulty communicating thoughts/reduced clarity of speech;Difficulty following commands/understanding  Cognition Arousal: Alert Behavior During Therapy: Flat affect Overall Cognitive Status: Difficult to assess                                 General Comments: Eyes open. Able to attend briefly to therapist. No command follow. Soft voice. Mumbling, nonsensical speech.        General Comments      Exercises     Assessment/Plan    PT Assessment Patient needs continued PT services  PT Problem List Decreased strength;Decreased balance;Decreased cognition;Decreased mobility;Impaired tone;Decreased activity tolerance;Decreased safety awareness       PT Treatment Interventions DME instruction;Therapeutic activities;Cognitive remediation;Gait training;Therapeutic exercise;Patient/family education;Balance training;Functional mobility training;Stair training    PT Goals (Current goals can be found in the Care Plan section)  Acute Rehab PT Goals Patient Stated Goal: home per family PT  Goal Formulation: With patient/family Time For Goal Achievement: 02/22/23 Potential to Achieve Goals: Good    Frequency Min 1X/week     Co-evaluation               AM-PAC PT "6 Clicks" Mobility  Outcome Measure Help needed turning from your back to your side while in a flat bed without using bedrails?: A Lot Help needed moving from lying on your back to sitting on the side of a flat bed without using bedrails?: A Lot Help needed moving to and from a bed to a chair (including a wheelchair)?: Total Help needed standing up from a chair using your arms (e.g., wheelchair or bedside chair)?: Total Help needed to walk in hospital room?: Total Help needed climbing 3-5 steps with a railing? : Total 6 Click Score: 8    End of Session Equipment Utilized During Treatment: Gait belt Activity Tolerance: Patient tolerated treatment well Patient left: in bed;with call bell/phone within reach;with bed alarm set;with family/visitor present Nurse Communication: Mobility status PT Visit Diagnosis: Other abnormalities of gait and mobility (R26.89)    Time: 1610-9604 PT Time Calculation (min) (ACUTE ONLY): 24 min   Charges:   PT Evaluation $PT Eval Moderate Complexity: 1 Mod   PT General Charges $$ ACUTE PT VISIT: 1 Visit         Ferd Glassing., PT  Office # [phone]   Ilda Foil 02/08/2023, 10:38 AM

## 2023-02-08 NOTE — Progress Notes (Signed)
Inpatient Rehab Admissions Coordinator:  ? ?Per therapy recommendations,  patient was screened for CIR candidacy by Devaney Segers, MS, CCC-SLP. At this time, Pt. Appears to be a a potential candidate for CIR. I will place   order for rehab consult per protocol for full assessment. Please contact me any with questions. ? ?Trine Fread, MS, CCC-SLP ?Rehab Admissions Coordinator  ?[phone] (celll) ?[phone] (office) ? ?

## 2023-02-08 NOTE — Evaluation (Signed)
Occupational Therapy Evaluation Patient Details Name: Kristen Parks MRN: [id number] DOB: 1940/07/29 Today's Date: 02/08/2023   History of Present Illness Kristen Parks is a 82 y.o. female with PMH significant for HTN, dyslipidemia, anemia, hypothyroidism, Left corpus collosum infarction 2019, chronic small vessel disease, dementia who was brought into ED d/t  left-gaze preference, aphasia, dysarthria, right-sided weakness. CTH negative for hemorrhage, shows possible infarct posterior left subfrontal gyrus. CTA w/P shows: Left MCA M2 stenosis vs embolus with 12ml core infarct. Patient taken to IR for thrombectomy.   Clinical Impression   This 82 yo female admitted with above presents to acute OT with PLOF of being totally independent with all basic ADLs and mobility without use of AD. Currently she is total A for basic ADLs, mod A for bed mobility with increased time and Mod A +2 sit<>stand from EOB. She will continue to benefit from acute OT with follow up from intensive inpatient follow up therapy, >3 hours/day.       If plan is discharge home, recommend the following: Two people to help with walking and/or transfers;A lot of help with bathing/dressing/bathroom;Assistance with cooking/housework;Assistance with feeding;Help with stairs or ramp for entrance;Assist for transportation;Direct supervision/assist for financial management;Direct supervision/assist for medications management    Functional Status Assessment  Patient has had a recent decline in their functional status and demonstrates the ability to make significant improvements in function in a reasonable and predictable amount of time.  Equipment Recommendations  Other (comment) (TBD next venue)    Recommendations for Other Services Rehab consult     Precautions / Restrictions Precautions Precautions: Fall Precaution Comments: cortrak Restrictions Weight Bearing Restrictions: No      Mobility Bed Mobility Overal bed  mobility: Needs Assistance Bed Mobility: Supine to Sit, Sit to Supine     Supine to sit: Mod assist, HOB elevated Sit to supine: Mod assist   General bed mobility comments: increased time, verbal/tactile cues for sequencing, assist with BLE and trunk    Transfers   Equipment used: 2 person hand held assist Transfers: Sit to/from Stand Sit to Stand: Mod assist, +2 physical assistance           General transfer comment: sit to stand from EOB, providing support at gait belt and bed pad under hips. R knee blocked as a precaution but no buckling noted. Bilat distal LE appeared to be braced on bed. Pt maintained flexed posture.      Balance Overall balance assessment: Needs assistance Sitting-balance support: Feet supported, Bilateral upper extremity supported Sitting balance-Leahy Scale: Poor     Standing balance support: Bilateral upper extremity supported, During functional activity Standing balance-Leahy Scale: Zero Standing balance comment: heavy reliance on external support                           ADL either performed or assessed with clinical judgement   ADL                                         General ADL Comments: total A     Vision   Additional Comments: pt with head and eye gaze to left--can get head and eyes to midline briefly (seconds)            Pertinent Vitals/Pain Pain Assessment Pain Assessment: Faces Faces Pain Scale: No hurt     Extremity/Trunk Assessment  Upper Extremity Assessment Upper Extremity Assessment: RUE deficits/detail RUE Deficits / Details: increased tone in flexion synergy pattern noted RUE when pt yawned; flexion syngery while seated EOB as well. In supine pt able to move arm out of synergy   Lower Extremity Assessment Lower Extremity Assessment: Generalized weakness (active movement noted bilat. Unable to MMT due to poor command follow.)   Cervical / Trunk Assessment Cervical / Trunk  Assessment: Kyphotic   Communication Communication Communication: Difficulty communicating thoughts/reduced clarity of speech;Difficulty following commands/understanding   Cognition Arousal: Alert Behavior During Therapy: Flat affect Overall Cognitive Status: Difficult to assess                                                  Home Living Family/patient expects to be discharged to:: Private residence Living Arrangements: Children Available Help at Discharge: Family;Available 24 hours/day Type of Home: House Home Access: Stairs to enter [employer] of Steps: 7 (split level) Entrance Stairs-Rails: Left Home Layout: Two level;Able to live on main level with bedroom/bathroom     Bathroom Shower/Tub: Tub/shower unit;Walk-in shower   Bathroom Toilet: Standard     Home Equipment: Agricultural consultant (2 wheels);Cane - single point          Prior Functioning/Environment Prior Level of Function : Independent/Modified Independent             Mobility Comments: occasional use of cane ADLs Comments: assist to get in/out of tub/shower        OT Problem List: Decreased strength;Decreased range of motion;Impaired balance (sitting and/or standing);Impaired vision/perception;Decreased coordination;Decreased cognition;Decreased safety awareness;Decreased knowledge of use of DME or AE;Impaired tone;Impaired UE functional use      OT Treatment/Interventions: Self-care/ADL training;DME and/or AE instruction;Balance training;Patient/family education;Neuromuscular education;Visual/perceptual remediation/compensation;Therapeutic exercise;Cognitive remediation/compensation;Therapeutic activities    OT Goals(Current goals can be found in the care plan section) Acute Rehab OT Goals Patient Stated Goal: family agreeable that patient needs rehab OT Goal Formulation: With family Time For Goal Achievement: 02/22/23 Potential to Achieve Goals: Good  OT Frequency: Min  1X/week    Co-evaluation PT/OT/SLP Co-Evaluation/Treatment: Yes Reason for Co-Treatment: For patient/therapist safety;To address functional/ADL transfers PT goals addressed during session: Mobility/safety with mobility;Balance;Strengthening/ROM OT goals addressed during session: Strengthening/ROM;ADL's and self-care      AM-PAC OT "6 Clicks" Daily Activity     Outcome Measure Help from another person eating meals?: Total Help from another person taking care of personal grooming?: Total Help from another person toileting, which includes using toliet, bedpan, or urinal?: Total Help from another person bathing (including washing, rinsing, drying)?: Total Help from another person to put on and taking off regular upper body clothing?: Total Help from another person to put on and taking off regular lower body clothing?: Total 6 Click Score: 6   End of Session Equipment Utilized During Treatment: Gait belt Nurse Communication: Mobility status  Activity Tolerance: Patient tolerated treatment well Patient left: in bed;with call bell/phone within reach;with bed alarm set;with family/visitor present  OT Visit Diagnosis: Unsteadiness on feet (R26.81);Other abnormalities of gait and mobility (R26.89);Muscle weakness (generalized) (M62.81);Low vision, both eyes (H54.2);Other symptoms and signs involving the nervous system (R29.898);Other symptoms and signs involving cognitive function;Cognitive communication deficit (R41.841) Symptoms and signs involving cognitive functions: Cerebral infarction                Time: 1610-9604 OT Time Calculation (min):  21 min Charges:  OT General Charges $OT Visit: 1 Visit OT Evaluation $OT Eval Moderate Complexity: 1 Mod Cathy L. OT Acute Rehabilitation Services Office [phone]    Evette Georges 02/08/2023, 10:55 AM

## 2023-02-08 NOTE — Evaluation (Signed)
Clinical/Bedside Swallow Evaluation Patient Details  Name: Kristen Parks MRN: [id number] Date of Birth: 06-06-1940  Today's Date: 02/08/2023 Time: SLP Start Time (ACUTE ONLY): 1243 SLP Stop Time (ACUTE ONLY): 1300 SLP Time Calculation (min) (ACUTE ONLY): 17 min  Past Medical History:  Past Medical History:  Diagnosis Date   Acquired hypothyroidism    Arthritis    "RLE" (04/15/2018)   CVA (cerebral vascular accident) (HCC) 04/14/2018   "don't feel any different and daughter doesn't notice any changes" (04/15/2018)   Dementia (HCC)    Dyslipidemia    Essential hypertension    Headache    "q couple months" (04/15/2018)   History of blood transfusion 04/14/2018   "low counts" (04/15/2018)   Migraine    "q couple months" (04/15/2018)   Peripheral neuropathy    Past Surgical History:  Past Surgical History:  Procedure Laterality Date   CARPAL TUNNEL RELEASE Left    CATARACT EXTRACTION, BILATERAL Bilateral    LAPAROSCOPIC CHOLECYSTECTOMY     RADIOLOGY WITH ANESTHESIA N/A 02/07/2023   Procedure: RADIOLOGY WITH ANESTHESIA;  Surgeon: Radiologist, Medication, MD;  Location: [facility] OR;  Service: Radiology;  Laterality: N/A;   HPI:  Pt is a 82 y.o. female who was brought into ED d/t  left-gaze preference, aphasia, dysarthria, right-sided weakness. CTH negative for hemorrhage, shows possible infarct posterior left subfrontal gyrus. CTA w/P shows: Left MCA M2 stenosis vs embolus with 12ml core infarct. Pt taken to IR for thrombectomy. Failed [facility] Swallow Screen 02/07/23. PMH significant for HTN, dyslipidemia, anemia, hypothyroidism, Left corpus collosum infarction 2019, chronic small vessel disease, dementia.    Assessment / Plan / Recommendation  Clinical Impression  Pt presents without clinical signs of aspiration post CVA, though question some mild oral deficits which are likely impacted by changes to motor/sensory function and cognitive changes. Family at bedside reports that pt consumed a regular  diet without difficulties at baseline. R facial asymmetry noted with reduced facial sensation on R side suspected, otherwise oral mechanism examination limited by pt's poor ability to follow commands. Pt resistive to PO trials, but with clinician and family encouragement, she consumed x2 ice chips, x3 sips of thin liquids by straw, x3 bites of puree and x1 bite of regular textured solid. x1 brief cough/mild thraot clear noted during liquid intake, otherwise no clinical signs of aspiration. Could not encourage successive sipping of liquids for 3oz water swallow challenge this date. Mildly prolonged oral manipulation/mastication observed with likely full clearance from oral cavity. Buccal cavities clear, however pt resistive to full oral cavity check. No POs noted on lingual body with pt communicating. Recommend initation of dy 2 diet/thin liquids with full supervision for meals/meds, encouraging small bites/sips at a slow rate. Will f/u for tolerance and readiness to advance.  SLP Visit Diagnosis: Dysphagia, unspecified (R13.10)    Aspiration Risk       Diet Recommendation Dysphagia 2 (Fine chop);Thin liquid    Liquid Administration via: Cup;Straw Medication Administration: Whole meds with liquid (or whole in puree as tolerated) Supervision: Full supervision/cueing for compensatory strategies;Staff to assist with self feeding Compensations: Minimize environmental distractions;Slow rate;Small sips/bites;Follow solids with liquid Postural Changes: Seated upright at 90 degrees    Other  Recommendations Oral Care Recommendations: Oral care BID;Staff/trained caregiver to provide oral care    Recommendations for follow up therapy are one component of a multi-disciplinary discharge planning process, led by the attending physician.  Recommendations may be updated based on patient status, additional functional criteria and insurance authorization.  Follow  up Recommendations Acute inpatient rehab  (3hours/day)      Assistance Recommended at Discharge    Functional Status Assessment Patient has had a recent decline in their functional status and demonstrates the ability to make significant improvements in function in a reasonable and predictable amount of time.  Frequency and Duration min 2x/week  2 weeks       Prognosis Prognosis for improved oropharyngeal function: Good Barriers to Reach Goals: Cognitive deficits;Language deficits      Swallow Study   General Date of Onset: 02/07/23 HPI: Pt is a 82 y.o. female who was brought into ED d/t  left-gaze preference, aphasia, dysarthria, right-sided weakness. CTH negative for hemorrhage, shows possible infarct posterior left subfrontal gyrus. CTA w/P shows: Left MCA M2 stenosis vs embolus with 12ml core infarct. Pt taken to IR for thrombectomy. Failed [facility] Swallow Screen 02/07/23. PMH significant for HTN, dyslipidemia, anemia, hypothyroidism, Left corpus collosum infarction 2019, chronic small vessel disease, dementia. Type of Study: Bedside Swallow Evaluation Previous Swallow Assessment: none per EMR Diet Prior to this Study: NPO Temperature Spikes Noted: No Respiratory Status: Room air History of Recent Intubation: Yes Total duration of intubation (days): 1 days (in OR) Date extubated: 02/07/23 Behavior/Cognition: Alert;Cooperative;Confused;Requires cueing;Doesn't follow directions Oral Cavity Assessment:  (difficult to assess) Oral Care Completed by SLP: No Oral Cavity - Dentition: Missing dentition Vision: Impaired for self-feeding Self-Feeding Abilities: Needs assist Patient Positioning: Upright in bed;Postural control adequate for testing Baseline Vocal Quality: Breathy;Low vocal intensity Volitional Cough: Cognitively unable to elicit    Oral/Motor/Sensory Function Overall Oral Motor/Sensory Function: Mild impairment Facial ROM: Reduced right;Suspected CN VII (facial) dysfunction Facial Symmetry: Abnormal symmetry  right;Suspected CN VII (facial) dysfunction Facial Strength: Reduced right;Suspected CN VII (facial) dysfunction Facial Sensation: Reduced right;Suspected CN V (Trigeminal) dysfunction   Ice Chips Ice chips: Within functional limits Presentation: Spoon   Thin Liquid Thin Liquid: Within functional limits Presentation: Straw    Nectar Thick Nectar Thick Liquid: Not tested   Honey Thick Honey Thick Liquid: Not tested   Puree Puree: Within functional limits Presentation: Spoon   Solid     Solid: Within functional limits       [name], MA, CCC-SLP Acute Rehabilitation Services Office Number: [phone]  Paulette Blanch 02/08/2023,1:42 PM

## 2023-02-09 ENCOUNTER — Inpatient Hospital Stay (HOSPITAL_COMMUNITY): Payer: Medicare HMO

## 2023-02-09 DIAGNOSIS — I639 Cerebral infarction, unspecified: Secondary | ICD-10-CM | POA: Diagnosis not present

## 2023-02-09 DIAGNOSIS — I6340 Cerebral infarction due to embolism of unspecified cerebral artery: Secondary | ICD-10-CM | POA: Diagnosis not present

## 2023-02-09 DIAGNOSIS — E43 Unspecified severe protein-calorie malnutrition: Secondary | ICD-10-CM | POA: Diagnosis present

## 2023-02-09 LAB — BASIC METABOLIC PANEL
Anion gap: 11 (ref 5–15)
BUN: 15 mg/dL (ref 8–23)
CO2: 22 mmol/L (ref 22–32)
Calcium: 9.2 mg/dL (ref 8.9–10.3)
Chloride: 106 mmol/L (ref 98–111)
Creatinine, Ser: 0.59 mg/dL (ref 0.44–1.00)
GFR, Estimated: >60 mL/min (ref >60)
Glucose, Bld: 112 mg/dL — ABNORMAL HIGH (ref 70–99)
Potassium: 3.5 mmol/L (ref 3.5–5.1)
Sodium: 139 mmol/L (ref 135–145)

## 2023-02-09 LAB — CBC
HCT: 36.9 % (ref 36.0–46.0)
Hemoglobin: 12.7 g/dL (ref 12.0–15.0)
MCH: 31.2 pg (ref 26.0–34.0)
MCHC: 34.4 g/dL (ref 30.0–36.0)
MCV: 90.7 fL (ref 80.0–100.0)
Platelets: 231 10*3/uL (ref 150–400)
RBC: 4.07 MIL/uL (ref 3.87–5.11)
RDW: 12.5 % (ref 11.5–15.5)
WBC: 7.8 10*3/uL (ref 4.0–10.5)
nRBC: 0 % (ref 0.0–0.2)

## 2023-02-09 LAB — MAGNESIUM: Magnesium: 1.7 mg/dL (ref 1.7–2.4)

## 2023-02-09 LAB — PHOSPHORUS: Phosphorus: 2.3 mg/dL — ABNORMAL LOW (ref 2.5–4.6)

## 2023-02-09 LAB — GLUCOSE, CAPILLARY
Glucose-Capillary: 105 mg/dL — ABNORMAL HIGH (ref 70–99)
Glucose-Capillary: 116 mg/dL — ABNORMAL HIGH (ref 70–99)
Glucose-Capillary: 124 mg/dL — ABNORMAL HIGH (ref 70–99)
Glucose-Capillary: 124 mg/dL — ABNORMAL HIGH (ref 70–99)
Glucose-Capillary: 126 mg/dL — ABNORMAL HIGH (ref 70–99)
Glucose-Capillary: 145 mg/dL — ABNORMAL HIGH (ref 70–99)

## 2023-02-09 MED ORDER — INSULIN ASPART 100 UNIT/ML IJ SOLN
0.0000 [IU] | INTRAMUSCULAR | Status: DC
  Administered 2023-02-09 (×3): 2 [IU] via SUBCUTANEOUS
  Administered 2023-02-09: 3 [IU] via SUBCUTANEOUS
  Administered 2023-02-09 – 2023-02-13 (×11): 2 [IU] via SUBCUTANEOUS

## 2023-02-09 NOTE — Progress Notes (Addendum)
STROKE TEAM PROGRESS NOTE   SUBJECTIVE (INTERVAL HISTORY) No family at the bedside. Patient laying in the bed in NAD.  Vitals are stable.  Phos is a little low at 2.3 and has been replaced.  Neurological exam is unchanged   OBJECTIVE Temp:  [96.7 F (35.9 C)-98.8 F (37.1 C)] 98.6 F (37 C) (10/27 1200) Pulse Rate:  [63-94] 73 (10/27 1200) Cardiac Rhythm: Normal sinus rhythm (10/27 1200) Resp:  [13-31] 26 (10/27 1200) BP: (115-144)/(65-116) 134/80 (10/27 1200) SpO2:  [94 %-99 %] 95 % (10/27 1200) Weight:  [52.2 kg] 52.2 kg (10/27 0429)  Recent Labs  Lab 02/08/23 1935 02/08/23 2334 02/09/23 0335 02/09/23 0758 02/09/23 1217  GLUCAP 128* 187* 145* 124* 124*   Recent Labs  Lab 02/07/23 1045 02/07/23 1051 02/07/23 1821 02/08/23 0631 02/08/23 1744 02/09/23 0709  NA 144 144  --  141  --  139  K 3.6 3.6  --  4.2  --  3.5  CL 112* 110  --  111  --  106  CO2 22  --   --  18*  --  22  GLUCOSE 155* 155*  --  143*  --  112*  BUN 20 24*  --  17  --  15  CREATININE 0.82 0.80  --  0.73  --  0.59  CALCIUM 10.0  --   --  9.3  --  9.2  MG  --   --  1.8 1.8 2.0 1.7  PHOS  --   --  2.5 2.4* 1.7* 2.3*   Recent Labs  Lab 02/07/23 1045  AST 20  ALT 11  ALKPHOS 180*  BILITOT 0.7  PROT 7.5  ALBUMIN 3.8   Recent Labs  Lab 02/07/23 1045 02/07/23 1051 02/08/23 0631 02/09/23 0709  WBC 6.5  --  9.6 7.8  NEUTROABS 4.8  --   --   --   HGB 14.6 15.6* 12.4 12.7  HCT 44.2 46.0 36.2 36.9  MCV 94.4  --  93.3 90.7  PLT 291  --  210 231   No results for input(s): "CKTOTAL", "CKMB", "CKMBINDEX", "TROPONINI" in the last 168 hours. Recent Labs    02/07/23 1045  LABPROT 14.2  INR 1.1   No results for input(s): "COLORURINE", "LABSPEC", "PHURINE", "GLUCOSEU", "HGBUR", "BILIRUBINUR", "KETONESUR", "PROTEINUR", "UROBILINOGEN", "NITRITE", "LEUKOCYTESUR" in the last 72 hours.  Invalid input(s): "APPERANCEUR"     Component Value Date/Time   CHOL 157 02/08/2023 0630   TRIG 100  02/08/2023 0630   HDL 36 (L) 02/08/2023 0630   CHOLHDL 4.4 02/08/2023 0630   VLDL 20 02/08/2023 0630   LDLCALC 101 (H) 02/08/2023 0630   Lab Results  Component Value Date   HGBA1C 5.2 02/07/2023   No results found for: "LABOPIA", "COCAINSCRNUR", "LABBENZ", "AMPHETMU", "THCU", "LABBARB"  Recent Labs  Lab 02/07/23 1029  ETH <10    I have personally reviewed the radiological images below and agree with the radiology interpretations.  ECHOCARDIOGRAM COMPLETE  Result Date: 02/08/2023    ECHOCARDIOGRAM REPORT   Patient Name:   TENIA HARK Date of Exam: 02/08/2023 Medical Rec #:  [id number]      Height:       62.0 in Accession #:    [id number]     Weight:       109.8 lb Date of Birth:  19-Dec-1940       BSA:          1.482 m Patient Age:    82 years  BP:           132/77 mmHg Patient Gender: F              HR:           76 bpm. Exam Location:  Inpatient Procedure: 2D Echo, Cardiac Doppler and Color Doppler Indications:    Stroke 434.91/I163.9  History:        Patient has prior history of Echocardiogram examinations, most                 recent 04/15/2018. Stroke; Risk Factors:Hypertension and                 Dyslipidemia. Migraine.  Sonographer:    Lucendia Herrlich RCS Referring Phys: Amada Jupiter, MCNEILL, P  Sonographer Comments: Image acquisition challenging due to patient body habitus. IMPRESSIONS  1. Left ventricular ejection fraction, by estimation, is 55 to 60%. The left ventricle has normal function. The left ventricle has no regional wall motion abnormalities. Left ventricular diastolic parameters are consistent with Grade I diastolic dysfunction (impaired relaxation).  2. Right ventricular systolic function is normal. The right ventricular size is normal. Tricuspid regurgitation signal is inadequate for assessing PA pressure. The estimated right ventricular systolic pressure is 18.5 mmHg.  3. The mitral valve is grossly normal. No evidence of mitral valve regurgitation. No evidence of  mitral stenosis.  4. The aortic valve is tricuspid. Aortic valve regurgitation is not visualized. No aortic stenosis is present.  5. The inferior vena cava is normal in size with greater than 50% respiratory variability, suggesting right atrial pressure of 3 mmHg. Comparison(s): No significant change from prior study. FINDINGS  Left Ventricle: Left ventricular ejection fraction, by estimation, is 55 to 60%. The left ventricle has normal function. The left ventricle has no regional wall motion abnormalities. The left ventricular internal cavity size was normal in size. There is  no left ventricular hypertrophy. Left ventricular diastolic function could not be evaluated due to nondiagnostic images. Left ventricular diastolic parameters are consistent with Grade I diastolic dysfunction (impaired relaxation). Right Ventricle: The right ventricular size is normal. No increase in right ventricular wall thickness. Right ventricular systolic function is normal. Tricuspid regurgitation signal is inadequate for assessing PA pressure. The tricuspid regurgitant velocity is 1.97 m/s, and with an assumed right atrial pressure of 3 mmHg, the estimated right ventricular systolic pressure is 18.5 mmHg. Left Atrium: Left atrial size was normal in size. Right Atrium: Right atrial size was normal in size. Pericardium: There is no evidence of pericardial effusion. Mitral Valve: The mitral valve is grossly normal. No evidence of mitral valve regurgitation. No evidence of mitral valve stenosis. Tricuspid Valve: The tricuspid valve is grossly normal. Tricuspid valve regurgitation is trivial. No evidence of tricuspid stenosis. Aortic Valve: The aortic valve is tricuspid. Aortic valve regurgitation is not visualized. No aortic stenosis is present. Aortic valve peak gradient measures 6.1 mmHg. Pulmonic Valve: The pulmonic valve was grossly normal. Pulmonic valve regurgitation is not visualized. No evidence of pulmonic stenosis. Aorta: The  aortic root and ascending aorta are structurally normal, with no evidence of dilitation. Venous: The inferior vena cava is normal in size with greater than 50% respiratory variability, suggesting right atrial pressure of 3 mmHg. IAS/Shunts: The atrial septum is grossly normal.  LEFT VENTRICLE PLAX 2D LVIDd:         3.30 cm   Diastology LVIDs:         2.10 cm   LV e' medial:  4.62 cm/s LV PW:         0.90 cm   LV E/e' medial:  10.6 LV IVS:        0.90 cm   LV e' lateral:   9.01 cm/s LVOT diam:     2.00 cm   LV E/e' lateral: 5.4 LV SV:         41 LV SV Index:   28 LVOT Area:     3.14 cm  RIGHT VENTRICLE             IVC RV S prime:     14.90 cm/s  IVC diam: 1.80 cm TAPSE (M-mode): 1.8 cm LEFT ATRIUM           Index LA diam:      2.40 cm 1.62 cm/m LA Vol (A2C): 9.8 ml  6.59 ml/m  AORTIC VALVE AV Area (Vmax): 1.77 cm AV Vmax:        123.00 cm/s AV Peak Grad:   6.1 mmHg LVOT Vmax:      69.22 cm/s LVOT Vmean:     43.400 cm/s LVOT VTI:       0.130 m  AORTA Ao Root diam: 2.80 cm Ao Asc diam:  3.10 cm MITRAL VALVE               TRICUSPID VALVE MV Area (PHT): 4.60 cm    TR Peak grad:   15.5 mmHg MV Decel Time: 165 msec    TR Vmax:        197.00 cm/s MV E velocity: 49.00 cm/s MV A velocity: 67.40 cm/s  SHUNTS MV E/A ratio:  0.73        Systemic VTI:  0.13 m                            Systemic Diam: 2.00 cm Lennie Odor MD Electronically signed by Lennie Odor MD Signature Date/Time: 02/08/2023/9:48:49 AM    Final    CT HEAD WO CONTRAST ( )  Result Date: 02/08/2023 CLINICAL DATA:  82 year old female code stroke presentation yesterday. Left MCA abnormality, left ACA and MCA branch occlusions and endovascular reperfusion. Posterior left MCA branch extravasation and subsequent blue and coil embolization. EXAM: CT HEAD WITHOUT CONTRAST TECHNIQUE: Contiguous axial images were obtained from the base of the skull through the vertex without intravenous contrast. RADIATION DOSE REDUCTION: This exam was performed according  to the departmental dose-optimization program which includes automated exposure control, adjustment of the mA and/or kV according to patient size and/or use of iterative reconstruction technique. COMPARISON:  Head CT 1039 hours yesterday. FINDINGS: Brain: Small volume subarachnoid hemorrhage in the left sylvian fissure primarily. No intraventricular blood. No ventriculomegaly. No midline shift. Left MCA cytotoxic edema most apparent in the posterior division, lateral perirolandic area series 3, image 18. Additional confluent cytotoxic edema at the posterior ACA/MCA watershed, left vertex and parietal lobe series 3, image 23. Elsewhere gray-white differentiation appears stable including advanced chronic white matter hypodensity, chronic left cerebellar infarcts. No other acute intracranial hemorrhage identified. Basilar cisterns remain patent. Vascular: Posterior left MCA region embolization sequelae. Skull: Stable, intact. Sinuses/Orbits: Visualized paranasal sinuses and mastoids are stable and well aerated. Other: Left nasoenteric tube now in place. No acute orbit or scalp soft tissue finding. Left gaze persists. IMPRESSION: 1. Cytotoxic edema in the posterior left MCA and left distal ACA/MCA watershed area. Sequelae of posterior left MCA branch embolization. 2. Small volume acute subarachnoid hemorrhage, primarily in the  left sylvian fissure. Heidelberg classification 3c: Subarachnoid hemorrhage. 3. No midline shift or significant intracranial mass effect. No IVH or ventriculomegaly. 4. Underlying chronic ischemia. Electronically Signed   By: Odessa Fleming M.D.   On: 02/08/2023 05:22   MR BRAIN WO CONTRAST  Result Date: 02/07/2023 CLINICAL DATA:  MCA branch occlusion status post thrombectomy EXAM: MRI HEAD WITHOUT CONTRAST TECHNIQUE: Multiplanar, multiecho pulse sequences of the brain and surrounding structures were obtained without intravenous contrast. COMPARISON:  Same-day CT/CTA head and neck FINDINGS: Brain:  There is moderate volume patchy diffusion restriction in the left cerebral hemisphere involving the caudate head, corona radiata, insula, parietal lobe, and occipital lobe primarily in the MCA and MCA watershed distributions. There is petechial hemorrhage in the parasagittal parietal lobe (12-45). There is FLAIR signal abnormality and SWI signal dropout in the basal cisterns extending to the left MCA cistern, sylvian fissure, and over the parietal lobe likely reflecting a combination of subarachnoid hemorrhage and contrast extravasation. There is no significant mass effect or midline shift. Background parenchymal volume is within expected limits for age. There is advanced background chronic small-vessel ischemic change with remote infarcts in the bilateral thalami and left cerebellar hemisphere. The pituitary and suprasellar region are grossly unremarkable. Vascular: The major flow voids are present. Skull and upper cervical spine: There is no definite marrow signal abnormality. Sinuses/Orbits: The paranasal sinuses are clear. Bilateral lens implants are in place. The globes and orbits are otherwise unremarkable. Other: The mastoid air cells and middle ear cavities are clear. IMPRESSION: 1. Moderate volume patchy acute infarcts in the left cerebral hemisphere primarily in the MCA and MCA watershed distributions with petechial hemorrhage in the parietal lobe but no significant mass effect or midline shift. 2. Probable combination of extravasated contrast and subarachnoid hemorrhage in the basal cisterns and over the left cerebral hemisphere. Recommend attention on subsequent head CTs. Electronically Signed   By: Lesia Hausen M.D.   On: 02/07/2023 18:30   DG Abd Portable 1V  Result Date: 02/07/2023 CLINICAL DATA:  Nasoenteric feeding tube placement EXAM: PORTABLE ABDOMEN - 1 VIEW COMPARISON:  None Available. FINDINGS: Nasoenteric feeding tube tip overlies the expected distal body of the stomach. The abdominal gas  pattern is nonspecific due to a paucity of intra-abdominal gas. Cholecystectomy clips are seen in the right upper quadrant. Contrast is seen within nondilated renal collecting systems bilaterally. IMPRESSION: 1. Nasoenteric feeding tube tip within the distal body of the stomach. Electronically Signed   By: Helyn Numbers M.D.   On: 02/07/2023 17:59   CT ANGIO HEAD NECK W WO CM W PERF (CODE STROKE)  Result Date: 02/07/2023 CLINICAL DATA:  Neuro deficit, acute, stroke suspected. Left-sided visual preference and slurred speech. EXAM: CT ANGIOGRAPHY HEAD AND NECK CT PERFUSION BRAIN TECHNIQUE: Multidetector CT imaging of the head and neck was performed using the standard protocol during bolus administration of intravenous contrast. Multiplanar CT image reconstructions and MIPs were obtained to evaluate the vascular anatomy. Carotid stenosis measurements (when applicable) are obtained utilizing NASCET criteria, using the distal internal carotid diameter as the denominator. Multiphase CT imaging of the brain was performed following IV bolus contrast injection. Subsequent parametric perfusion maps were calculated using RAPID software. RADIATION DOSE REDUCTION: This exam was performed according to the departmental dose-optimization program which includes automated exposure control, adjustment of the mA and/or kV according to patient size and/or use of iterative reconstruction technique. CONTRAST:  OMNIPAQUE IOHEXOL 350 MG/ML SOLN COMPARISON:  Head CT 02/07/2023. CTA neck 04/15/2018. MRI/MRA  head 04/14/2018. FINDINGS: CTA NECK FINDINGS Aortic arch: Three-vessel arch configuration. Arch vessel origins are patent. Right carotid system: No evidence of dissection, stenosis (50% or greater) or occlusion. Left carotid system: No evidence of dissection, stenosis (50% or greater) or occlusion. Vertebral arteries: Left dominant. No evidence of dissection, stenosis (50% or greater) or occlusion. Skeleton: Mild cervical  spondylosis without high-grade spinal canal stenosis. 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 Upper chest: 11 mm solid nodule in the superior segment of the left lower lobe (axial image 186 series 5). Review of the MIP images confirms the above findings CTA HEAD FINDINGS Anterior circulation: Intracranial ICAs are patent without stenosis or aneurysm. Proximal high-grade stenosis versus subocclusive embolus of the left MCA superior M2 division with distal reconstitution (axial images 98-103 series 8), new from the prior MRA. Distal branches are symmetric. Posterior circulation: Normal basilar artery. The SCAs, AICAs and PICAs are patent proximally. The PCAs are patent proximally without stenosis or aneurysm. Distal branches are symmetric. Venous sinuses: Early phase of contrast. Anatomic variants: Hypoplastic right A1 segment. Azygous A2 segment. Persistent fetal origin of the right PCA with hypoplastic right P1 segment. Review of the MIP images confirms the above findings CT Brain Perfusion Findings: CBF (<30%) Volume: 12mL estimated by automated post processing. However, given significant motion during the first pass arterial bolus, this may be an overestimate. Perfusion (Tmax>6.0s) volume: 72mL estimated by automated post processing. However, given significant motion during the first pass arterial bolus, this may be an overestimate. Mismatch Volume: 60mL Infarction Location:Left frontoparietal junction, MCA territory. IMPRESSION: 1. Proximal high-grade stenosis versus subocclusive embolus of the left MCA superior M2 division with distal reconstitution, new from the prior MRA. 2. CT perfusion demonstrates a 12 mL core infarct in the left frontoparietal junction, MCA territory. However, given significant motion during the first pass arterial bolus, this may be an overestimate. 3. No  hemodynamically significant stenosis in the neck. 4. 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 5. A 11 mm solid nodule in the superior segment of the left lower lobe. Consider one of the following in 3 months for both low-risk and high-risk individuals: (a) repeat chest CT, (b) follow-up PET-CT, or (c) tissue sampling. This recommendation follows the consensus statement: Guidelines for Management of Incidental Pulmonary Nodules Detected on CT Images: From the Fleischner Society 2017; Radiology 2017; 284:228-243. Code stroke imaging results were communicated on 02/07/2023 at 11:17 am to provider Dr. Amada Jupiter via telephone, who verbally acknowledged these results. Electronically Signed   By: Orvan Falconer M.D.   On: 02/07/2023 11:32   CT HEAD CODE STROKE WO CONTRAST  Result Date: 02/07/2023 CLINICAL DATA:  Code stroke. Neuro deficit, acute, stroke suspected. Left visual preference and slurred speech. EXAM: CT HEAD WITHOUT CONTRAST TECHNIQUE: Contiguous axial images were obtained from the base of the skull through the vertex without intravenous contrast. RADIATION DOSE REDUCTION: This exam was performed according to the departmental dose-optimization program which includes automated exposure control, adjustment of the mA and/or kV according to patient size and/or use of iterative reconstruction technique. COMPARISON:  Head CT 04/28/2021. FINDINGS: Brain: No acute hemorrhage. Possible new loss of gray-white differentiation along the posterior aspect of the left subfrontal gyrus (sagittal image 41 series 6). Background of severe chronic small-vessel disease with old infarct along the left middle frontal  gyrus and old perforator infarcts in the bilateral thalami and left cerebellar hemisphere. No hydrocephalus or extra-axial collection. No mass effect or midline shift. Vascular: No hyperdense vessel or unexpected  calcification. Skull: No calvarial fracture or suspicious bone lesion. Skull base is unremarkable. Sinuses/Orbits: No acute finding. Other: None. ASPECTS [facility] Stroke Program Early CT Score) - Ganglionic level infarction (caudate, lentiform nuclei, internal capsule, insula, M1-M3 cortex): 7 - Supraganglionic infarction (M4-M6 cortex): 2 Total score (0-10 with 10 being normal): 9 IMPRESSION: 1. Possible new loss of gray-white differentiation along the posterior aspect of the left subfrontal gyrus, which could represent an acute infarct. No acute hemorrhage. 2. Background of severe chronic small-vessel disease with old infarct along the left middle frontal gyrus and old perforator infarcts in the bilateral thalami and left cerebellar hemisphere. Code stroke imaging results were communicated on 02/07/2023 at 10:47 am to provider Dr. Amada Jupiter via secure text paging. Electronically Signed   By: Orvan Falconer M.D.   On: 02/07/2023 10:47     PHYSICAL EXAM  Temp:  [96.7 F (35.9 C)-98.8 F (37.1 C)] 98.6 F (37 C) (10/27 1200) Pulse Rate:  [63-94] 73 (10/27 1200) Resp:  [13-31] 26 (10/27 1200) BP: (115-144)/(65-116) 134/80 (10/27 1200) SpO2:  [94 %-99 %] 95 % (10/27 1200) Weight:  [52.2 kg] 52.2 kg (10/27 0429)  General - Well nourished, well developed, in no apparent distress. Cardiovascular - Regular rhythm and rate.  Neuro - drowsy sleepy but easily arousable and able to maintain awakefulness. Pt eyes open, global aphasia, not following commands, able to have sounds out but not meaningful. Left gaze preference, can cross midline, tracks,  right hemianopia vs. Visual neglect, no blink to threat on the right, PERRL. Right facial droop. Tongue protrusion not cooperative. LUE at least 3+/5 and LLE at least 3/5, RUE flaccid, RLE withdraws    ASSESSMENT/PLAN Ms. Kristen Parks is a 82 y.o. female with history of hypertension, hyperlipidemia, stroke in 2019 admitted for slurred speech, aphasia,  left gaze and right-sided weakness. No tPA given due to outside window.    Stroke:  left MCA and ACA infarcts s/p IR with TICI3 on L A4 and TICI2c on L M3 complicated by extravasation at left M3 s/p coil embolization, embolic pattern, likely secondary to cardioembolic source CT head no acute infarct, questionable left frontal early sign of stroke CT head and neck left M2 high-grade stenosis versus near occlusion CTP 12/72 Status post IR with left A4 occlusion with TICI3 reperfusion, left M3 occlusion with TICI2c complicated by extravasation status post coil embolization MRI left MCA and ACA infarcts with hemorrhagic transformation and questionable SAH CT 12/26 left MCA and left distal ACA/MCA watershed area infarcts.  Small volume acute SAH, primary in the left sylvian fissure 2D Echo EF 55 to 60% LE venous Doppler no DVT Will consider loop recorder if neuro significantly improved LDL 101 HgbA1c 5.2 Heparin subcu for VTE prophylaxis aspirin 81 mg daily prior to admission, now on No antithrombotic given HT with SAH Ongoing aggressive stroke risk factor management Therapy recommendations:  CIR Disposition: Pending  History of stroke 03/2018 admitted for small left CC and cingulate gyrus infarct left ACA territory.  MRA head negative.  CTA neck on markable.  LDL 39, A1c 5.1.  EF 60 to 60%.  Had anemia with PRBC transfusion.  Discharged on aspirin and Zocor 20. Outpatient 30-day CardioNet monitor negative for A-fib.  Hypertension Stable BP goal less than 160 given SAH Long term BP goal normotensive  Hyperlipidemia Home meds: None LDL 101, goal < 70 Now on Crestor 20 Continue statin at discharge  Dysphagia Dys 2 diet with thin liquid Still has tube feeding @40cc  FW 100 Q6h Encourage p.o. intake  Other Stroke Risk Factors Advanced age  Other Active Problems Hypophosphatemia Phos 2.3 replaced  Hospital day # 2 .  Gevena Mart DNP, ACNPC-AG  [facility]  Neuro[facility]  ATTENDING NOTE: I reviewed above note and agree with the assessment and plan. Pt was seen and examined.   No family at bedside.  Patient lying in bed. No acute event overnight. Awake, alert, eyes open, global aphasia, not following commands, able to have sounds out but not meaningful. Left gaze preference, now able to cross midline, and tracking bilaterally, right hemianopia vs. Visual neglect, no blink to threat on the right. Right facial droop. Tongue protrusion not cooperative. LUE at least 3+/5 and LLE at least 3/5, RUE flaccid, RLE withdraws.  Tele monitoring showing no afib so far. Will consider further cardia monitoring if neuro further improves. CT repeat yesterday showed small SAH, antiplatelet on hold for now. Continue TF and FW. Continue statin.   For detailed assessment and plan, please refer to above/below as I have made changes wherever appropriate.   Marvel Plan, MD PhD Stroke Neurology 02/09/2023 4:21 PM  This patient is critically ill due to stroke with left ACA and MCA occlusion status post IR complicated by [facility] and at significant risk of neurological worsening, death form recurrent stroke, hemorrhagic conversion, vasospasm, seizure. This patient's care requires constant monitoring of vital signs, hemodynamics, respiratory and cardiac monitoring, review of multiple databases, neurological assessment, discussion with family, other specialists and medical decision making of high complexity. I spent 35 minutes of neurocritical care time in the care of this patient.   To contact Stroke Continuity provider, please refer to [email or web address]. After hours, contact General Neurology

## 2023-02-09 NOTE — Progress Notes (Signed)
VASCULAR LAB    Bilateral lower extremity venous duplex has been performed.  See CV proc for preliminary results.   Devann Cribb, RVT 02/09/2023, 1:35 PM

## 2023-02-09 NOTE — Plan of Care (Signed)
  Problem: Coping: Goal: Will verbalize positive feelings about self Outcome: Progressing Goal: Will identify appropriate support needs Outcome: Progressing   Problem: Nutrition: Goal: Risk of aspiration will decrease Outcome: Progressing Goal: Dietary intake will improve Outcome: Progressing

## 2023-02-10 DIAGNOSIS — I6340 Cerebral infarction due to embolism of unspecified cerebral artery: Secondary | ICD-10-CM | POA: Diagnosis not present

## 2023-02-10 DIAGNOSIS — I69320 Aphasia following cerebral infarction: Secondary | ICD-10-CM

## 2023-02-10 DIAGNOSIS — I69391 Dysphagia following cerebral infarction: Secondary | ICD-10-CM | POA: Diagnosis not present

## 2023-02-10 DIAGNOSIS — I639 Cerebral infarction, unspecified: Secondary | ICD-10-CM

## 2023-02-10 LAB — GLUCOSE, CAPILLARY
Glucose-Capillary: 106 mg/dL — ABNORMAL HIGH (ref 70–99)
Glucose-Capillary: 112 mg/dL — ABNORMAL HIGH (ref 70–99)
Glucose-Capillary: 118 mg/dL — ABNORMAL HIGH (ref 70–99)
Glucose-Capillary: 138 mg/dL — ABNORMAL HIGH (ref 70–99)
Glucose-Capillary: 139 mg/dL — ABNORMAL HIGH (ref 70–99)
Glucose-Capillary: 150 mg/dL — ABNORMAL HIGH (ref 70–99)

## 2023-02-10 LAB — CBC
HCT: 39.7 % (ref 36.0–46.0)
Hemoglobin: 13.7 g/dL (ref 12.0–15.0)
MCH: 31.5 pg (ref 26.0–34.0)
MCHC: 34.5 g/dL (ref 30.0–36.0)
MCV: 91.3 fL (ref 80.0–100.0)
Platelets: 224 10*3/uL (ref 150–400)
RBC: 4.35 MIL/uL (ref 3.87–5.11)
RDW: 12.4 % (ref 11.5–15.5)
WBC: 12.5 10*3/uL — ABNORMAL HIGH (ref 4.0–10.5)
nRBC: 0 % (ref 0.0–0.2)

## 2023-02-10 LAB — BASIC METABOLIC PANEL
Anion gap: 9 (ref 5–15)
BUN: 18 mg/dL (ref 8–23)
CO2: 18 mmol/L — ABNORMAL LOW (ref 22–32)
Calcium: 9.2 mg/dL (ref 8.9–10.3)
Chloride: 107 mmol/L (ref 98–111)
Creatinine, Ser: 0.74 mg/dL (ref 0.44–1.00)
GFR, Estimated: >60 mL/min (ref >60)
Glucose, Bld: 142 mg/dL — ABNORMAL HIGH (ref 70–99)
Potassium: 3.9 mmol/L (ref 3.5–5.1)
Sodium: 134 mmol/L — ABNORMAL LOW (ref 135–145)

## 2023-02-10 LAB — MAGNESIUM: Magnesium: 1.9 mg/dL (ref 1.7–2.4)

## 2023-02-10 LAB — PHOSPHORUS: Phosphorus: 2.1 mg/dL — ABNORMAL LOW (ref 2.5–4.6)

## 2023-02-10 MED ORDER — K PHOS MONO-SOD PHOS DI & MONO 155-852-130 MG PO TABS
250.0000 mg | ORAL_TABLET | Freq: Once | ORAL | Status: AC
  Administered 2023-02-10: 250 mg
  Filled 2023-02-10: qty 1

## 2023-02-10 MED ORDER — PNEUMOCOCCAL 20-VAL CONJ VACC 0.5 ML IM SUSY
0.5000 mL | PREFILLED_SYRINGE | INTRAMUSCULAR | Status: AC
  Administered 2023-02-12: 0.5 mL via INTRAMUSCULAR
  Filled 2023-02-10: qty 0.5

## 2023-02-10 MED ORDER — INFLUENZA VAC A&B SURF ANT ADJ 0.5 ML IM SUSY
0.5000 mL | PREFILLED_SYRINGE | INTRAMUSCULAR | Status: AC
  Administered 2023-02-11: 0.5 mL via INTRAMUSCULAR
  Filled 2023-02-10: qty 0.5

## 2023-02-10 MED ORDER — OSMOLITE 1.5 CAL PO LIQD
500.0000 mL | ORAL | Status: DC
  Administered 2023-02-11 – 2023-02-13 (×3): 500 mL
  Filled 2023-02-10 (×2): qty 711

## 2023-02-10 MED ORDER — POTASSIUM & SODIUM PHOSPHATES 280-160-250 MG PO PACK
2.0000 | PACK | Freq: Three times a day (TID) | ORAL | Status: AC
Start: 2023-02-10 — End: 2023-02-10
  Administered 2023-02-10 (×2): 2
  Filled 2023-02-10 (×3): qty 2

## 2023-02-10 MED ORDER — ORAL CARE MOUTH RINSE
15.0000 mL | OROMUCOSAL | Status: DC
  Administered 2023-02-10 – 2023-02-13 (×15): 15 mL via OROMUCOSAL

## 2023-02-10 MED ORDER — ENSURE ENLIVE PO LIQD
237.0000 mL | Freq: Two times a day (BID) | ORAL | Status: DC
  Administered 2023-02-11 – 2023-02-12 (×3): 237 mL via ORAL

## 2023-02-10 MED ORDER — ORAL CARE MOUTH RINSE: 15.0000 mL | OROMUCOSAL | Status: DC | PRN

## 2023-02-10 NOTE — Progress Notes (Signed)
Report given to [employer] on [facility] [address]

## 2023-02-10 NOTE — Consult Note (Addendum)
Physical Medicine and Rehabilitation Consult Reason for Consult:CVA Referring Physician: Roda Shutters   HPI: Kristen Parks is a 82 y.o. female with history of corpus callosum infarct 2019, dementia and arthritis.  She required some assistance for meals but otherwise ambulated without assistive device and did not require any help for dressing or bathing.  There was usually somebody with her at home although at times she was left alone.  Daughter, son-in-law and granddaughter live in the same home and they have different work shifts both daytime and nighttime.  The patient had onset of slurred speech and aphasia on 02/07/2023.  CT head showed posterior left ACA and left MCA infarct no TNK due to time since onset.  Went thrombectomy and IR with TICI 3 revascularization left a 4 TICI 2C in the left M3.  Stroke etiology felt to be cardioembolic but no history of atrial fibrillation and none seen on EKG monitor.  Has had global aphasia but spontaneous movement on the right side.  Physical therapy and Occupational Therapy started.  The patient has required +2 max assistance for mobility and self-care. The patient was cleared by speech therapy for D2 diet with thin liquids but due to poor intake has required supplemental tube feeding.   Review of Systems  Unable to perform ROS: Other  Global aphasia Past Medical History:  Diagnosis Date   Acquired hypothyroidism    Arthritis    "RLE" (04/15/2018)   CVA (cerebral vascular accident) (HCC) 04/14/2018   "don't feel any different and daughter doesn't notice any changes" (04/15/2018)   Dementia (HCC)    Dyslipidemia    Essential hypertension    Headache    "q couple months" (04/15/2018)   History of blood transfusion 04/14/2018   "low counts" (04/15/2018)   Migraine    "q couple months" (04/15/2018)   Peripheral neuropathy    Past Surgical History:  Procedure Laterality Date   CARPAL TUNNEL RELEASE Left    CATARACT EXTRACTION, BILATERAL Bilateral     LAPAROSCOPIC CHOLECYSTECTOMY     RADIOLOGY WITH ANESTHESIA N/A 02/07/2023   Procedure: RADIOLOGY WITH ANESTHESIA;  Surgeon: Radiologist, Medication, MD;  Location: [facility] OR;  Service: Radiology;  Laterality: N/A;   History reviewed. No pertinent family history. Social History:  reports that she quit smoking about 56 years ago. Her smoking use included cigarettes. She started smoking about 76 years ago. She has a 10 pack-year smoking history. She has never used smokeless tobacco. She reports that she does not drink alcohol and does not use drugs. Allergies:  Allergies  Allergen Reactions   Latex Other (See Comments)    Reaction type/severity unknown   Penicillins Other (See Comments)    Reaction type/severity unknown    Sulfa Antibiotics Other (See Comments)    Reaction type/severity unknown    Medications Prior to Admission  Medication Sig Dispense Refill   aspirin EC 81 MG EC tablet Take 1 tablet (81 mg total) by mouth daily. 30 tablet 1   levothyroxine (SYNTHROID, LEVOTHROID) 112 MCG tablet Take 112 mcg by mouth daily.       Home: Home Living Family/patient expects to be discharged to:: Private residence Living Arrangements: Children Available Help at Discharge: Family, Available 24 hours/day Type of Home: House Home Access: Stairs to enter [employer] of Steps: 7 (split level) Entrance Stairs-Rails: Left Home Layout: Two level, Able to live on main level with bedroom/bathroom Bathroom Shower/Tub: Tub/shower unit, Health visitor: Standard Home Equipment: Agricultural consultant (2  wheels), Cane - single point  Functional History: Prior Function Prior Level of Function : Independent/Modified Independent Mobility Comments: occasional use of cane ADLs Comments: assist to get in/out of tub/shower Functional Status:  Mobility: Bed Mobility Overal bed mobility: Needs Assistance Bed Mobility: Supine to Sit Supine to sit: HOB elevated, Mod assist Sit to  supine: Mod assist General bed mobility comments: increased time, verbal/tactile cues for sequencing, assist with BLE and trunk Transfers Overall transfer level: Needs assistance Equipment used: 1 person hand held assist, 2 person hand held assist Transfers: Sit to/from Stand, Bed to chair/wheelchair/BSC Sit to Stand: Mod assist, +2 physical assistance Bed to/from chair/wheelchair/BSC transfer type:: Squat pivot Squat pivot transfers: Max assist General transfer comment: +1 max squat pivot bed to recliner toward L. STS x 2 trials from recliner +2 mod.      ADL: ADL General ADL Comments: total A  Cognition: Cognition Overall Cognitive Status: Difficult to assess Arousal/Alertness: Awake/alert Orientation Level:  (UTA due to aphasia) Attention: Focused, Sustained Focused Attention: Appears intact Sustained Attention: Impaired Sustained Attention Impairment: Verbal basic, Functional basic Behaviors: Restless, Impulsive Cognition Arousal: Alert Behavior During Therapy: Flat affect Overall Cognitive Status: Difficult to assess General Comments: Eyes open. Able to attend briefly to therapist. Following simple commands < 25% of trials. Soft voice. Mumbling, nonsensical speech. Difficult to assess due to: Impaired communication  Blood pressure 133/77, pulse 78, temperature 98.3 F (36.8 C), temperature source Axillary, resp. rate 18, height 5\' 2"  (1.575 m), weight 50.2 kg, SpO2 98%. Physical Exam Vitals and nursing note reviewed.  Constitutional:      Appearance: She is normal weight.  HENT:     Head: Normocephalic and atraumatic.  Eyes:     Extraocular Movements: Extraocular movements intact.     Conjunctiva/sclera: Conjunctivae normal.     Pupils: Pupils are equal, round, and reactive to light.  Musculoskeletal:     Right lower leg: No edema.     Left lower leg: No edema.     Comments: No pain with upper extremity or lower extremity active assisted range of motion No joint  swelling in the lower extremities Hand mitts on the upper extremities  Skin:    General: Skin is warm and dry.  Neurological:     Mental Status: She is alert.     Comments: Global aphasia could not follow simple commands such as closing eyes, responds partially to gestural cues  Could not adequately assess visual fields or sensation due to communication problems Antigravity strength left upper extremity and left lower extremity as well as right upper extremity right lower extremity but requires hand overhand assistance for movement.  Psychiatric:        Mood and Affect: Mood normal.        Behavior: Behavior normal.     Results for orders placed or performed during the hospital encounter of 02/07/23 (from the past 24 hour(s))  Glucose, capillary     Status: Abnormal   Collection Time: 02/09/23  4:11 PM  Result Value Ref Range   Glucose-Capillary 105 (H) 70 - 99 mg/dL  Glucose, capillary     Status: Abnormal   Collection Time: 02/09/23  7:42 PM  Result Value Ref Range   Glucose-Capillary 116 (H) 70 - 99 mg/dL  Glucose, capillary     Status: Abnormal   Collection Time: 02/09/23 11:30 PM  Result Value Ref Range   Glucose-Capillary 126 (H) 70 - 99 mg/dL  Glucose, capillary     Status: Abnormal  Collection Time: 02/10/23  3:10 AM  Result Value Ref Range   Glucose-Capillary 150 (H) 70 - 99 mg/dL  Glucose, capillary     Status: Abnormal   Collection Time: 02/10/23  7:25 AM  Result Value Ref Range   Glucose-Capillary 139 (H) 70 - 99 mg/dL  Basic metabolic panel     Status: Abnormal   Collection Time: 02/10/23  8:40 AM  Result Value Ref Range   Sodium 134 (L) 135 - 145 mmol/L   Potassium 3.9 3.5 - 5.1 mmol/L   Chloride 107 98 - 111 mmol/L   CO2 18 (L) 22 - 32 mmol/L   Glucose, Bld 142 (H) 70 - 99 mg/dL   BUN 18 8 - 23 mg/dL   Creatinine, Ser 1.30 0.44 - 1.00 mg/dL   Calcium 9.2 8.9 - 86.5 mg/dL   GFR, Estimated >78 >46 mL/min   Anion gap 9 5 - 15  CBC     Status: Abnormal    Collection Time: 02/10/23  8:40 AM  Result Value Ref Range   WBC 12.5 (H) 4.0 - 10.5 K/uL   RBC 4.35 3.87 - 5.11 MIL/uL   Hemoglobin 13.7 12.0 - 15.0 g/dL   HCT 96.2 95.2 - 84.1 %   MCV 91.3 80.0 - 100.0 fL   MCH 31.5 26.0 - 34.0 pg   MCHC 34.5 30.0 - 36.0 g/dL   RDW 32.4 40.1 - 02.7 %   Platelets 224 150 - 400 K/uL   nRBC 0.0 0.0 - 0.2 %  Phosphorus     Status: Abnormal   Collection Time: 02/10/23  8:40 AM  Result Value Ref Range   Phosphorus 2.1 (L) 2.5 - 4.6 mg/dL  Magnesium     Status: None   Collection Time: 02/10/23  8:40 AM  Result Value Ref Range   Magnesium 1.9 1.7 - 2.4 mg/dL  Glucose, capillary     Status: Abnormal   Collection Time: 02/10/23 11:51 AM  Result Value Ref Range   Glucose-Capillary 106 (H) 70 - 99 mg/dL   VAS [place] LOWER EXTREMITY VENOUS (DVT)  Result Date: 02/10/2023  Lower Venous DVT Study Patient Name:  SANNE BRAZELTON  Date of Exam:   02/09/2023 Medical Rec #: [id number]       Accession #:    [id number] Date of Birth: 11/11/1940        Patient Gender: F Patient Age:   85 years Exam Location:  [facility] Procedure:      VAS [place] LOWER EXTREMITY VENOUS (DVT) Referring Phys: Scheryl Marten XU --------------------------------------------------------------------------------  Indications: Stroke.  Limitations: Bandages, altered mental status, poor hemodynamically positioning. Comparison Study: No prior study on file Performing Technologist: Sherren Kerns RVS  Examination Guidelines: A complete evaluation includes B-mode imaging, spectral Doppler, color Doppler, and power Doppler as needed of all accessible portions of each vessel. Bilateral testing is considered an integral part of a complete examination. Limited examinations for reoccurring indications may be performed as noted. The reflux portion of the exam is performed with the patient in reverse Trendelenburg.  +---------+---------------+---------+-----------+----------+-------------------+ RIGHT     CompressibilityPhasicitySpontaneityPropertiesThrombus Aging      +---------+---------------+---------+-----------+----------+-------------------+ CFV      Full           Yes      Yes                                      +---------+---------------+---------+-----------+----------+-------------------+  SFJ      Full                                                             +---------+---------------+---------+-----------+----------+-------------------+ FV Prox  Full                                                             +---------+---------------+---------+-----------+----------+-------------------+ FV Mid   Full                                                             +---------+---------------+---------+-----------+----------+-------------------+ FV DistalFull                                                             +---------+---------------+---------+-----------+----------+-------------------+ PFV      Full                                                             +---------+---------------+---------+-----------+----------+-------------------+ POP      Full           Yes      No                                       +---------+---------------+---------+-----------+----------+-------------------+ PTV      Full                                                             +---------+---------------+---------+-----------+----------+-------------------+ PERO                                                  Not well visualized +---------+---------------+---------+-----------+----------+-------------------+   +---------+---------------+---------+-----------+----------+-------------------+ LEFT     CompressibilityPhasicitySpontaneityPropertiesThrombus Aging      +---------+---------------+---------+-----------+----------+-------------------+ CFV      Full           Yes      Yes                                       +---------+---------------+---------+-----------+----------+-------------------+ SFJ  Full                                                             +---------+---------------+---------+-----------+----------+-------------------+ FV Prox  Full                                                             +---------+---------------+---------+-----------+----------+-------------------+ FV Mid   Full                                                             +---------+---------------+---------+-----------+----------+-------------------+ FV DistalFull                                                             +---------+---------------+---------+-----------+----------+-------------------+ PFV      Full                                                             +---------+---------------+---------+-----------+----------+-------------------+ POP                     No       Yes                  patent by color and                                                       Doppler             +---------+---------------+---------+-----------+----------+-------------------+ PTV      Full                                                             +---------+---------------+---------+-----------+----------+-------------------+ PERO                                                  Not well visualized +---------+---------------+---------+-----------+----------+-------------------+     *See table(s) above for measurements and observations. Electronically signed by Sherald Hess MD on 02/10/2023 at 11:13:31 AM.    Final     Assessment/Plan: Diagnosis: Left MCA and left  ACA infarct with global aphasia and dysphagia Does the need for close, 24 hr/day medical supervision in concert with the patient's rehab needs make it unreasonable for this patient to be served in a less intensive setting? Potentially Co-Morbidities requiring supervision/potential  complications:  -Poor oral intake, history of cognitive decline Due to bladder management, bowel management, safety, skin/wound care, disease management, medication administration, pain management, and patient education, does the patient require 24 hr/day rehab nursing? Potentially Does the patient require coordinated care of a physician, rehab nurse, therapy disciplines of PT, OT, speech to address physical and functional deficits in the context of the above medical diagnosis(es)? Potentially Addressing deficits in the following areas: balance, endurance, locomotion, strength, transferring, bowel/bladder control, bathing, dressing, feeding, grooming, toileting, cognition, speech, language, swallowing, and psychosocial support Can the patient actively participate in an intensive therapy program of at least 3 hrs of therapy per day at least 5 days per week? No The potential for patient to make measurable gains while on inpatient rehab is poor Anticipated functional outcomes upon discharge from inpatient rehab are mod assist  with PT, mod assist with OT, max assist with SLP. Estimated rehab length of stay to reach the above functional goals is: NA Anticipated discharge destination: Other Overall Rehab/Functional Prognosis: poor  POST ACUTE RECOMMENDATIONS: This patient's condition is appropriate for continued rehabilitative care in the following setting: SNF Patient has agreed to participate in recommended program. N/A Note that insurance prior authorization may be required for reimbursement for recommended care.  Comment: Discussed potential for improvement to the point where the patient can be cared for by 1 person but would need 24-hour care at a mod assist level.  Discussed that the timeframe of the recovery would be prolonged, expect slow improvements and likely will require PEG placement due to cognitive issues with inadequate intake Please reconsult if patient's functional status improves to  significant degree, continue PT OT speech and acute care   I have personally performed a face to face diagnostic evaluation of this patient. Additionally, I have examined the patient's medical record including any pertinent labs and radiographic images. If the physician assistant has documented in this note, I have reviewed and edited or otherwise concur with the physician assistant's documentation.  Thanks,  Erick Colace, MD 02/10/2023

## 2023-02-10 NOTE — Progress Notes (Signed)
Physical Therapy Treatment Patient Details Name: Kristen Parks MRN: [id number] DOB: 05-14-1940 Today's Date: 02/10/2023   History of Present Illness Kristen Parks is a 82 y.o. female with PMH significant for HTN, dyslipidemia, anemia, hypothyroidism, Left corpus collosum infarction 2019, chronic small vessel disease, dementia who was brought into ED d/t  left-gaze preference, aphasia, dysarthria, right-sided weakness. CTH negative for hemorrhage, shows possible infarct posterior left subfrontal gyrus. CTA w/P shows: Left MCA M2 stenosis vs embolus with 12ml core infarct. Patient taken to IR for thrombectomy.    PT Comments  Pt able to progress OOB this session. Mod assist supine to sit, max assist squat pivot transfer toward L, and +2 mod assist sit to stand. L gaze preference. Followed simple commands < 25% of trials. Mumbled, nonsensical speech. RUE spontaneously drifting up with pt holding it in ~ 90 degrees shoulder flexion for several seconds before bringing it back to resting position. Pt in recliner with feet elevated at end of session.    If plan is discharge home, recommend the following: A lot of help with walking and/or transfers;A lot of help with bathing/dressing/bathroom;Assist for transportation;Help with stairs or ramp for entrance;Direct supervision/assist for medications management;Assistance with cooking/housework;Supervision due to cognitive status   Can travel by private vehicle        Equipment Recommendations  Other (comment) (TBD)    Recommendations for Other Services       Precautions / Restrictions Precautions Precautions: Fall;Other (comment) Precaution Comments: cortrak, bilat mitts     Mobility  Bed Mobility Overal bed mobility: Needs Assistance Bed Mobility: Supine to Sit     Supine to sit: HOB elevated, Mod assist     General bed mobility comments: increased time, verbal/tactile cues for sequencing, assist with BLE and trunk     Transfers Overall transfer level: Needs assistance Equipment used: 1 person hand held assist, 2 person hand held assist Transfers: Sit to/from Stand, Bed to chair/wheelchair/BSC Sit to Stand: Mod assist, +2 physical assistance     Squat pivot transfers: Max assist     General transfer comment: +1 max squat pivot bed to recliner toward L. STS x 2 trials from recliner +2 mod.    Ambulation/Gait                   Stairs             Wheelchair Mobility     Tilt Bed    Modified Rankin (Stroke Patients Only) Modified Rankin (Stroke Patients Only) Pre-Morbid Rankin Score: Slight disability Modified Rankin: Severe disability     Balance Overall balance assessment: Needs assistance Sitting-balance support: Feet supported, Bilateral upper extremity supported Sitting balance-Leahy Scale: Poor     Standing balance support: Bilateral upper extremity supported, During functional activity Standing balance-Leahy Scale: Zero Standing balance comment: heavy reliance on external support                            Cognition Arousal: Alert Behavior During Therapy: Flat affect Overall Cognitive Status: Difficult to assess                                 General Comments: Eyes open. Able to attend briefly to therapist. Following simple commands < 25% of trials. Soft voice. Mumbling, nonsensical speech.        Exercises      General Comments General comments (  skin integrity, edema, etc.): L gaze preference. Able to briefly cross midline visually with max cues.      Pertinent Vitals/Pain Pain Assessment Pain Assessment: Faces Faces Pain Scale: No hurt    Home Living                          Prior Function            PT Goals (current goals can now be found in the care plan section) Acute Rehab PT Goals Patient Stated Goal: home per family Progress towards PT goals: Progressing toward goals    Frequency    Min  1X/week      PT Plan      Co-evaluation              AM-PAC PT "6 Clicks" Mobility   Outcome Measure  Help needed turning from your back to your side while in a flat bed without using bedrails?: A Lot Help needed moving from lying on your back to sitting on the side of a flat bed without using bedrails?: A Lot Help needed moving to and from a bed to a chair (including a wheelchair)?: Total Help needed standing up from a chair using your arms (e.g., wheelchair or bedside chair)?: Total Help needed to walk in hospital room?: Total Help needed climbing 3-5 steps with a railing? : Total 6 Click Score: 8    End of Session Equipment Utilized During Treatment: Gait belt Activity Tolerance: Patient tolerated treatment well Patient left: in chair;with call bell/phone within reach;with chair alarm set Nurse Communication: Mobility status PT Visit Diagnosis: Other abnormalities of gait and mobility (R26.89)     Time: 1610-9604 PT Time Calculation (min) (ACUTE ONLY): 26 min  Charges:    $Therapeutic Activity: 23-37 mins PT General Charges $$ ACUTE PT VISIT: 1 Visit                     Ferd Glassing., PT  Office # [phone]    Kristen Parks 02/10/2023, 8:33 AM

## 2023-02-10 NOTE — Progress Notes (Addendum)
STROKE TEAM PROGRESS NOTE   SUBJECTIVE (INTERVAL HISTORY) No family at the bedside. Patient sitting up in chair in NAD.  Vitals are stable. Phosphate low, replaced.  Neurological exam is improved with increased movement of RUE.   Will order CT for tomorrow AM to re-eval [facility]   OBJECTIVE Temp:  [96.7 F (35.9 C)-98.7 F (37.1 C)] 98.1 F (36.7 C) (10/28 0754) Pulse Rate:  [66-90] 85 (10/28 0915) Cardiac Rhythm: Normal sinus rhythm (10/28 0800) Resp:  [15-26] 15 (10/28 0915) BP: (109-153)/(61-96) 109/96 (10/28 0915) SpO2:  [93 %-99 %] 96 % (10/28 0915) Weight:  [50.2 kg] 50.2 kg (10/28 0500)  Recent Labs  Lab 02/09/23 1611 02/09/23 1942 02/09/23 2330 02/10/23 0310 02/10/23 0725  GLUCAP 105* 116* 126* 150* 139*   Recent Labs  Lab 02/07/23 1045 02/07/23 1051 02/07/23 1821 02/08/23 0631 02/08/23 1744 02/09/23 0709 02/10/23 0840  NA 144 144  --  141  --  139 134*  K 3.6 3.6  --  4.2  --  3.5 3.9  CL 112* 110  --  111  --  106 107  CO2 22  --   --  18*  --  22 18*  GLUCOSE 155* 155*  --  143*  --  112* 142*  BUN 20 24*  --  17  --  15 18  CREATININE 0.82 0.80  --  0.73  --  0.59 0.74  CALCIUM 10.0  --   --  9.3  --  9.2 9.2  MG  --   --  1.8 1.8 2.0 1.7 1.9  PHOS  --   --  2.5 2.4* 1.7* 2.3* 2.1*   Recent Labs  Lab 02/07/23 1045  AST 20  ALT 11  ALKPHOS 180*  BILITOT 0.7  PROT 7.5  ALBUMIN 3.8   Recent Labs  Lab 02/07/23 1045 02/07/23 1051 02/08/23 0631 02/09/23 0709 02/10/23 0840  WBC 6.5  --  9.6 7.8 12.5*  NEUTROABS 4.8  --   --   --   --   HGB 14.6 15.6* 12.4 12.7 13.7  HCT 44.2 46.0 36.2 36.9 39.7  MCV 94.4  --  93.3 90.7 91.3  PLT 291  --  210 231 224   No results for input(s): "CKTOTAL", "CKMB", "CKMBINDEX", "TROPONINI" in the last 168 hours. No results for input(s): "LABPROT", "INR" in the last 72 hours.  No results for input(s): "COLORURINE", "LABSPEC", "PHURINE", "GLUCOSEU", "HGBUR", "BILIRUBINUR", "KETONESUR", "PROTEINUR",  "UROBILINOGEN", "NITRITE", "LEUKOCYTESUR" in the last 72 hours.  Invalid input(s): "APPERANCEUR"     Component Value Date/Time   CHOL 157 02/08/2023 0630   TRIG 100 02/08/2023 0630   HDL 36 (L) 02/08/2023 0630   CHOLHDL 4.4 02/08/2023 0630   VLDL 20 02/08/2023 0630   LDLCALC 101 (H) 02/08/2023 0630   Lab Results  Component Value Date   HGBA1C 5.2 02/07/2023   No results found for: "LABOPIA", "COCAINSCRNUR", "LABBENZ", "AMPHETMU", "THCU", "LABBARB"  Recent Labs  Lab 02/07/23 1029  ETH <10    I have personally reviewed the radiological images below and agree with the radiology interpretations.  VAS [place] LOWER EXTREMITY VENOUS (DVT)  Result Date: 02/09/2023  Lower Venous DVT Study Patient Name:  DICK BURDGE  Date of Exam:   02/09/2023 Medical Rec #: [id number]       Accession #:    [id number] Date of Birth: 1940-07-12        Patient Gender: F Patient Age:   3 years Exam Location:  [facility] Procedure:      VAS [place] LOWER EXTREMITY VENOUS (DVT) Referring Phys: Scheryl Marten Maurisha Mongeau --------------------------------------------------------------------------------  Indications: Stroke.  Limitations: Bandages, altered mental status, poor hemodynamically positioning. Comparison Study: No prior study on file Performing Technologist: Sherren Kerns RVS  Examination Guidelines: A complete evaluation includes B-mode imaging, spectral Doppler, color Doppler, and power Doppler as needed of all accessible portions of each vessel. Bilateral testing is considered an integral part of a complete examination. Limited examinations for reoccurring indications may be performed as noted. The reflux portion of the exam is performed with the patient in reverse Trendelenburg.  +---------+---------------+---------+-----------+----------+-------------------+ RIGHT    CompressibilityPhasicitySpontaneityPropertiesThrombus Aging      +---------+---------------+---------+-----------+----------+-------------------+ CFV       Full           Yes      Yes                                      +---------+---------------+---------+-----------+----------+-------------------+ SFJ      Full                                                             +---------+---------------+---------+-----------+----------+-------------------+ FV Prox  Full                                                             +---------+---------------+---------+-----------+----------+-------------------+ FV Mid   Full                                                             +---------+---------------+---------+-----------+----------+-------------------+ FV DistalFull                                                             +---------+---------------+---------+-----------+----------+-------------------+ PFV      Full                                                             +---------+---------------+---------+-----------+----------+-------------------+ POP      Full           Yes      No                                       +---------+---------------+---------+-----------+----------+-------------------+ PTV      Full                                                             +---------+---------------+---------+-----------+----------+-------------------+  PERO                                                  Not well visualized +---------+---------------+---------+-----------+----------+-------------------+   +---------+---------------+---------+-----------+----------+-------------------+ LEFT     CompressibilityPhasicitySpontaneityPropertiesThrombus Aging      +---------+---------------+---------+-----------+----------+-------------------+ CFV      Full           Yes      Yes                                      +---------+---------------+---------+-----------+----------+-------------------+ SFJ      Full                                                              +---------+---------------+---------+-----------+----------+-------------------+ FV Prox  Full                                                             +---------+---------------+---------+-----------+----------+-------------------+ FV Mid   Full                                                             +---------+---------------+---------+-----------+----------+-------------------+ FV DistalFull                                                             +---------+---------------+---------+-----------+----------+-------------------+ PFV      Full                                                             +---------+---------------+---------+-----------+----------+-------------------+ POP                     No       Yes                  patent by color and                                                       Doppler             +---------+---------------+---------+-----------+----------+-------------------+ PTV      Full                                                             +---------+---------------+---------+-----------+----------+-------------------+  PERO                                                  Not well visualized +---------+---------------+---------+-----------+----------+-------------------+    *See table(s) above for measurements and observations.    Preliminary    ECHOCARDIOGRAM COMPLETE  Result Date: 02/08/2023    ECHOCARDIOGRAM REPORT   Patient Name:   BROCHA VERGA Date of Exam: 02/08/2023 Medical Rec #:  [id number]      Height:       62.0 in Accession #:    [id number]     Weight:       109.8 lb Date of Birth:  1941/03/31       BSA:          1.482 m Patient Age:    82 years       BP:           132/77 mmHg Patient Gender: F              HR:           76 bpm. Exam Location:  Inpatient Procedure: 2D Echo, Cardiac Doppler and Color Doppler Indications:    Stroke 434.91/I163.9  History:        Patient has prior history of  Echocardiogram examinations, most                 recent 04/15/2018. Stroke; Risk Factors:Hypertension and                 Dyslipidemia. Migraine.  Sonographer:    Lucendia Herrlich RCS Referring Phys: Amada Jupiter, MCNEILL, P  Sonographer Comments: Image acquisition challenging due to patient body habitus. IMPRESSIONS  1. Left ventricular ejection fraction, by estimation, is 55 to 60%. The left ventricle has normal function. The left ventricle has no regional wall motion abnormalities. Left ventricular diastolic parameters are consistent with Grade I diastolic dysfunction (impaired relaxation).  2. Right ventricular systolic function is normal. The right ventricular size is normal. Tricuspid regurgitation signal is inadequate for assessing PA pressure. The estimated right ventricular systolic pressure is 18.5 mmHg.  3. The mitral valve is grossly normal. No evidence of mitral valve regurgitation. No evidence of mitral stenosis.  4. The aortic valve is tricuspid. Aortic valve regurgitation is not visualized. No aortic stenosis is present.  5. The inferior vena cava is normal in size with greater than 50% respiratory variability, suggesting right atrial pressure of 3 mmHg. Comparison(s): No significant change from prior study. FINDINGS  Left Ventricle: Left ventricular ejection fraction, by estimation, is 55 to 60%. The left ventricle has normal function. The left ventricle has no regional wall motion abnormalities. The left ventricular internal cavity size was normal in size. There is  no left ventricular hypertrophy. Left ventricular diastolic function could not be evaluated due to nondiagnostic images. Left ventricular diastolic parameters are consistent with Grade I diastolic dysfunction (impaired relaxation). Right Ventricle: The right ventricular size is normal. No increase in right ventricular wall thickness. Right ventricular systolic function is normal. Tricuspid regurgitation signal is inadequate for assessing  PA pressure. The tricuspid regurgitant velocity is 1.97 m/s, and with an assumed right atrial pressure of 3 mmHg, the estimated right ventricular systolic pressure is 18.5 mmHg. Left Atrium: Left atrial size was normal in size. Right Atrium: Right atrial size was normal in size. Pericardium: There is  no evidence of pericardial effusion. Mitral Valve: The mitral valve is grossly normal. No evidence of mitral valve regurgitation. No evidence of mitral valve stenosis. Tricuspid Valve: The tricuspid valve is grossly normal. Tricuspid valve regurgitation is trivial. No evidence of tricuspid stenosis. Aortic Valve: The aortic valve is tricuspid. Aortic valve regurgitation is not visualized. No aortic stenosis is present. Aortic valve peak gradient measures 6.1 mmHg. Pulmonic Valve: The pulmonic valve was grossly normal. Pulmonic valve regurgitation is not visualized. No evidence of pulmonic stenosis. Aorta: The aortic root and ascending aorta are structurally normal, with no evidence of dilitation. Venous: The inferior vena cava is normal in size with greater than 50% respiratory variability, suggesting right atrial pressure of 3 mmHg. IAS/Shunts: The atrial septum is grossly normal.  LEFT VENTRICLE PLAX 2D LVIDd:         3.30 cm   Diastology LVIDs:         2.10 cm   LV e' medial:    4.62 cm/s LV PW:         0.90 cm   LV E/e' medial:  10.6 LV IVS:        0.90 cm   LV e' lateral:   9.01 cm/s LVOT diam:     2.00 cm   LV E/e' lateral: 5.4 LV SV:         41 LV SV Index:   28 LVOT Area:     3.14 cm  RIGHT VENTRICLE             IVC RV S prime:     14.90 cm/s  IVC diam: 1.80 cm TAPSE (M-mode): 1.8 cm LEFT ATRIUM           Index LA diam:      2.40 cm 1.62 cm/m LA Vol (A2C): 9.8 ml  6.59 ml/m  AORTIC VALVE AV Area (Vmax): 1.77 cm AV Vmax:        123.00 cm/s AV Peak Grad:   6.1 mmHg LVOT Vmax:      69.22 cm/s LVOT Vmean:     43.400 cm/s LVOT VTI:       0.130 m  AORTA Ao Root diam: 2.80 cm Ao Asc diam:  3.10 cm MITRAL VALVE                TRICUSPID VALVE MV Area (PHT): 4.60 cm    TR Peak grad:   15.5 mmHg MV Decel Time: 165 msec    TR Vmax:        197.00 cm/s MV E velocity: 49.00 cm/s MV A velocity: 67.40 cm/s  SHUNTS MV E/A ratio:  0.73        Systemic VTI:  0.13 m                            Systemic Diam: 2.00 cm Lennie Odor MD Electronically signed by Lennie Odor MD Signature Date/Time: 02/08/2023/9:48:49 AM    Final    CT HEAD WO CONTRAST ( )  Result Date: 02/08/2023 CLINICAL DATA:  82 year old female code stroke presentation yesterday. Left MCA abnormality, left ACA and MCA branch occlusions and endovascular reperfusion. Posterior left MCA branch extravasation and subsequent blue and coil embolization. EXAM: CT HEAD WITHOUT CONTRAST TECHNIQUE: Contiguous axial images were obtained from the base of the skull through the vertex without intravenous contrast. RADIATION DOSE REDUCTION: This exam was performed according to the departmental dose-optimization program which includes automated exposure control, adjustment of the mA  and/or kV according to patient size and/or use of iterative reconstruction technique. COMPARISON:  Head CT 1039 hours yesterday. FINDINGS: Brain: Small volume subarachnoid hemorrhage in the left sylvian fissure primarily. No intraventricular blood. No ventriculomegaly. No midline shift. Left MCA cytotoxic edema most apparent in the posterior division, lateral perirolandic area series 3, image 18. Additional confluent cytotoxic edema at the posterior ACA/MCA watershed, left vertex and parietal lobe series 3, image 23. Elsewhere gray-white differentiation appears stable including advanced chronic white matter hypodensity, chronic left cerebellar infarcts. No other acute intracranial hemorrhage identified. Basilar cisterns remain patent. Vascular: Posterior left MCA region embolization sequelae. Skull: Stable, intact. Sinuses/Orbits: Visualized paranasal sinuses and mastoids are stable and well aerated. Other:  Left nasoenteric tube now in place. No acute orbit or scalp soft tissue finding. Left gaze persists. IMPRESSION: 1. Cytotoxic edema in the posterior left MCA and left distal ACA/MCA watershed area. Sequelae of posterior left MCA branch embolization. 2. Small volume acute subarachnoid hemorrhage, primarily in the left sylvian fissure. Heidelberg classification 3c: Subarachnoid hemorrhage. 3. No midline shift or significant intracranial mass effect. No IVH or ventriculomegaly. 4. Underlying chronic ischemia. Electronically Signed   By: Odessa Fleming M.D.   On: 02/08/2023 05:22   MR BRAIN WO CONTRAST  Result Date: 02/07/2023 CLINICAL DATA:  MCA branch occlusion status post thrombectomy EXAM: MRI HEAD WITHOUT CONTRAST TECHNIQUE: Multiplanar, multiecho pulse sequences of the brain and surrounding structures were obtained without intravenous contrast. COMPARISON:  Same-day CT/CTA head and neck FINDINGS: Brain: There is moderate volume patchy diffusion restriction in the left cerebral hemisphere involving the caudate head, corona radiata, insula, parietal lobe, and occipital lobe primarily in the MCA and MCA watershed distributions. There is petechial hemorrhage in the parasagittal parietal lobe (12-45). There is FLAIR signal abnormality and SWI signal dropout in the basal cisterns extending to the left MCA cistern, sylvian fissure, and over the parietal lobe likely reflecting a combination of subarachnoid hemorrhage and contrast extravasation. There is no significant mass effect or midline shift. Background parenchymal volume is within expected limits for age. There is advanced background chronic small-vessel ischemic change with remote infarcts in the bilateral thalami and left cerebellar hemisphere. The pituitary and suprasellar region are grossly unremarkable. Vascular: The major flow voids are present. Skull and upper cervical spine: There is no definite marrow signal abnormality. Sinuses/Orbits: The paranasal sinuses  are clear. Bilateral lens implants are in place. The globes and orbits are otherwise unremarkable. Other: The mastoid air cells and middle ear cavities are clear. IMPRESSION: 1. Moderate volume patchy acute infarcts in the left cerebral hemisphere primarily in the MCA and MCA watershed distributions with petechial hemorrhage in the parietal lobe but no significant mass effect or midline shift. 2. Probable combination of extravasated contrast and subarachnoid hemorrhage in the basal cisterns and over the left cerebral hemisphere. Recommend attention on subsequent head CTs. Electronically Signed   By: Lesia Hausen M.D.   On: 02/07/2023 18:30   DG Abd Portable 1V  Result Date: 02/07/2023 CLINICAL DATA:  Nasoenteric feeding tube placement EXAM: PORTABLE ABDOMEN - 1 VIEW COMPARISON:  None Available. FINDINGS: Nasoenteric feeding tube tip overlies the expected distal body of the stomach. The abdominal gas pattern is nonspecific due to a paucity of intra-abdominal gas. Cholecystectomy clips are seen in the right upper quadrant. Contrast is seen within nondilated renal collecting systems bilaterally. IMPRESSION: 1. Nasoenteric feeding tube tip within the distal body of the stomach. Electronically Signed   By: Lyda Kalata.D.  On: 02/07/2023 17:59   CT ANGIO HEAD NECK W WO CM W PERF (CODE STROKE)  Result Date: 02/07/2023 CLINICAL DATA:  Neuro deficit, acute, stroke suspected. Left-sided visual preference and slurred speech. EXAM: CT ANGIOGRAPHY HEAD AND NECK CT PERFUSION BRAIN TECHNIQUE: Multidetector CT imaging of the head and neck was performed using the standard protocol during bolus administration of intravenous contrast. Multiplanar CT image reconstructions and MIPs were obtained to evaluate the vascular anatomy. Carotid stenosis measurements (when applicable) are obtained utilizing NASCET criteria, using the distal internal carotid diameter as the denominator. Multiphase CT imaging of the brain was  performed following IV bolus contrast injection. Subsequent parametric perfusion maps were calculated using RAPID software. RADIATION DOSE REDUCTION: This exam was performed according to the departmental dose-optimization program which includes automated exposure control, adjustment of the mA and/or kV according to patient size and/or use of iterative reconstruction technique. CONTRAST:  OMNIPAQUE IOHEXOL 350 MG/ML SOLN COMPARISON:  Head CT 02/07/2023. CTA neck 04/15/2018. MRI/MRA head 04/14/2018. FINDINGS: CTA NECK FINDINGS Aortic arch: Three-vessel arch configuration. Arch vessel origins are patent. Right carotid system: No evidence of dissection, stenosis (50% or greater) or occlusion. Left carotid system: No evidence of dissection, stenosis (50% or greater) or occlusion. Vertebral arteries: Left dominant. No evidence of dissection, stenosis (50% or greater) or occlusion. Skeleton: Mild cervical spondylosis without high-grade spinal canal stenosis. 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 Upper chest: 11 mm solid nodule in the superior segment of the left lower lobe (axial image 186 series 5). Review of the MIP images confirms the above findings CTA HEAD FINDINGS Anterior circulation: Intracranial ICAs are patent without stenosis or aneurysm. Proximal high-grade stenosis versus subocclusive embolus of the left MCA superior M2 division with distal reconstitution (axial images 98-103 series 8), new from the prior MRA. Distal branches are symmetric. Posterior circulation: Normal basilar artery. The SCAs, AICAs and PICAs are patent proximally. The PCAs are patent proximally without stenosis or aneurysm. Distal branches are symmetric. Venous sinuses: Early phase of contrast. Anatomic variants: Hypoplastic right A1 segment. Azygous A2 segment. Persistent fetal origin of the right PCA  with hypoplastic right P1 segment. Review of the MIP images confirms the above findings CT Brain Perfusion Findings: CBF (<30%) Volume: 12mL estimated by automated post processing. However, given significant motion during the first pass arterial bolus, this may be an overestimate. Perfusion (Tmax>6.0s) volume: 72mL estimated by automated post processing. However, given significant motion during the first pass arterial bolus, this may be an overestimate. Mismatch Volume: 60mL Infarction Location:Left frontoparietal junction, MCA territory. IMPRESSION: 1. Proximal high-grade stenosis versus subocclusive embolus of the left MCA superior M2 division with distal reconstitution, new from the prior MRA. 2. CT perfusion demonstrates a 12 mL core infarct in the left frontoparietal junction, MCA territory. However, given significant motion during the first pass arterial bolus, this may be an overestimate. 3. No hemodynamically significant stenosis in the neck. 4. 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 5. A 11 mm solid nodule in the superior segment of the left lower lobe. Consider one of the following in 3 months for both low-risk and high-risk individuals: (a) repeat chest CT, (b) follow-up PET-CT, or (c) tissue sampling. This recommendation follows the consensus statement: Guidelines for Management of Incidental Pulmonary Nodules Detected on CT Images: From the Fleischner Society  2017; Radiology 2017; 528:413-244. Code stroke imaging results were communicated on 02/07/2023 at 11:17 am to provider Dr. Amada Jupiter via telephone, who verbally acknowledged these results. Electronically Signed   By: Orvan Falconer M.D.   On: 02/07/2023 11:32   CT HEAD CODE STROKE WO CONTRAST  Result Date: 02/07/2023 CLINICAL DATA:  Code stroke. Neuro deficit, acute, stroke suspected. Left visual preference and slurred speech. EXAM: CT HEAD WITHOUT  CONTRAST TECHNIQUE: Contiguous axial images were obtained from the base of the skull through the vertex without intravenous contrast. RADIATION DOSE REDUCTION: This exam was performed according to the departmental dose-optimization program which includes automated exposure control, adjustment of the mA and/or kV according to patient size and/or use of iterative reconstruction technique. COMPARISON:  Head CT 04/28/2021. FINDINGS: Brain: No acute hemorrhage. Possible new loss of gray-white differentiation along the posterior aspect of the left subfrontal gyrus (sagittal image 41 series 6). Background of severe chronic small-vessel disease with old infarct along the left middle frontal gyrus and old perforator infarcts in the bilateral thalami and left cerebellar hemisphere. No hydrocephalus or extra-axial collection. No mass effect or midline shift. Vascular: No hyperdense vessel or unexpected calcification. Skull: No calvarial fracture or suspicious bone lesion. Skull base is unremarkable. Sinuses/Orbits: No acute finding. Other: None. ASPECTS [facility] Stroke Program Early CT Score) - Ganglionic level infarction (caudate, lentiform nuclei, internal capsule, insula, M1-M3 cortex): 7 - Supraganglionic infarction (M4-M6 cortex): 2 Total score (0-10 with 10 being normal): 9 IMPRESSION: 1. Possible new loss of gray-white differentiation along the posterior aspect of the left subfrontal gyrus, which could represent an acute infarct. No acute hemorrhage. 2. Background of severe chronic small-vessel disease with old infarct along the left middle frontal gyrus and old perforator infarcts in the bilateral thalami and left cerebellar hemisphere. Code stroke imaging results were communicated on 02/07/2023 at 10:47 am to provider Dr. Amada Jupiter via secure text paging. Electronically Signed   By: Orvan Falconer M.D.   On: 02/07/2023 10:47     PHYSICAL EXAM  Temp:  [96.7 F (35.9 C)-98.7 F (37.1 C)] 98.1 F (36.7 C)  (10/28 0754) Pulse Rate:  [66-90] 85 (10/28 0915) Resp:  [15-26] 15 (10/28 0915) BP: (109-153)/(61-96) 109/96 (10/28 0915) SpO2:  [93 %-99 %] 96 % (10/28 0915) Weight:  [50.2 kg] 50.2 kg (10/28 0500)  General - Well nourished, well developed, in no apparent distress. Cardiovascular - Regular rhythm and rate.  Neuro - awake, alert. Pt eyes open, global aphasia, not following commands, able to have sounds out but not meaningful. Left gaze preference, does cross midline, tracks,  right hemianopia vs. Visual neglect, no blink to threat on the right, PERRL. Right facial droop. Tongue protrusion not cooperative. LUE at least 3+/5 and LLE at least 3/5, RUE spontaneous, purposeful movement against gravity, RLE withdraws  ASSESSMENT/PLAN Ms. MEREL LAFORTE is a 82 y.o. female with history of hypertension, hyperlipidemia, stroke in 2019 admitted for slurred speech, aphasia, left gaze and right-sided weakness. No tPA given due to outside window.    Stroke:  left MCA and ACA infarcts s/p IR with TICI3 on L A4 and TICI2c on L M3 complicated by extravasation at left M3 s/p coil embolization, embolic pattern, likely secondary to cardioembolic source CT head no acute infarct, questionable left frontal early sign of stroke CT head and neck left M2 high-grade stenosis versus near occlusion CTP 12/72 Status post IR with left A4 occlusion with TICI3 reperfusion, left M3 occlusion with TICI2c complicated by extravasation status post  coil embolization MRI left MCA and ACA infarcts with hemorrhagic transformation and questionable SAH CT 12/26 left MCA and left distal ACA/MCA watershed area infarcts.  Small volume acute SAH, primary in the left sylvian fissure CT repeat pending 2D Echo EF 55 to 60% LE venous Doppler no DVT Will consider loop recorder if neuro significantly improved LDL 101 HgbA1c 5.2 Heparin subcu for VTE prophylaxis aspirin 81 mg daily prior to admission, now on No antithrombotic given HT with  SAH. Will repeat CT tomorrow AM to reevaluate.  Ongoing aggressive stroke risk factor management Therapy recommendations:  CIR Disposition: Pending  History of stroke 03/2018 admitted for small left CC and cingulate gyrus infarct left ACA territory.  MRA head negative.  CTA neck on markable.  LDL 39, A1c 5.1.  EF 60 to 60%.  Had anemia with PRBC transfusion.  Discharged on aspirin and Zocor 20. Outpatient 30-day CardioNet monitor negative for A-fib.  Hypertension Stable BP goal less than 160 given SAH Long term BP goal normotensive  Hyperlipidemia Home meds: None LDL 101, goal < 70 Now on Crestor 20 Continue statin at discharge  Dysphagia Now on Dys 2 diet with thin liquid Still has tube feeding @40cc  Will change to nocturnal feeding  FW 100 Q6h Encourage p.o. intake   Other Stroke Risk Factors Advanced age  Other Active Problems Hypophosphatemia Phos 2.1 replaced  Hospital day # 3 Pt seen by Neuro NP/APP and later by MD. Note/plan to be edited by MD as needed.    Lynnae January, DNP, AGACNP-BC [facility] Please use AMION for contact information & EPIC for messaging.  ATTENDING NOTE: I reviewed above note and agree with the assessment and plan. Pt was seen and examined.   No family is at the bedside. Pt reclining in chair, still has left gaze preference but able to cross midline. Left neglect and HH. RUE spontaneous 3/5 on observation but not quite cooperative with exam. RLE 2+/5 not able to hold with knee flexion and foot on bed position. Will repeat CT in am to see if can start antiplatelet. Pending CIR later this week.   For detailed assessment and plan, please refer to above/below as I have made changes wherever appropriate.   Marvel Plan, MD PhD Stroke Neurology 02/10/2023 9:25 PM  This patient is critically ill due to stroke with left ACA and MCA occlusion status post IR complicated by [facility] and at significant risk of neurological worsening, death  form recurrent stroke, hemorrhagic conversion, vasospasm, seizure. This patient's care requires constant monitoring of vital signs, hemodynamics, respiratory and cardiac monitoring, review of multiple databases, neurological assessment, discussion with family, other specialists and medical decision making of high complexity. I spent 35 minutes of neurocritical care time in the care of this patient.

## 2023-02-10 NOTE — Progress Notes (Addendum)
Inpatient Rehab Coordinator Note:  I met with patient, son and daughter at bedside to discuss CIR recommendations and goals/expectations of CIR stay.  We reviewed 3 hrs/day of therapy, physician follow up, and average length of stay 2 weeks (dependent upon progress) with goals of min A. Patient will have good family support at home. Son and daughter want to think about it. They think a longer, less intense rehab situation may work better for the patient. Will continue to follow patient for progress and improved tolerance and will follow up with family again tomorrow.   Rehab Admissons Coordinator [place], [place], [place] [phone]

## 2023-02-10 NOTE — Plan of Care (Signed)
Pt alert to self. IV is patent and saline locked. Incontinent of bowel and bladder. Daughter at bedside. Continues tube feeds at 50 ml/hr. Pt doesn't follow commands. Incomprehensible speech. Will continue with current plan of care.  Problem: Education: Goal: Knowledge of disease or condition will improve Outcome: Progressing Goal: Knowledge of secondary prevention will improve (MUST DOCUMENT ALL) Outcome: Progressing Goal: Knowledge of patient specific risk factors will improve Loraine Leriche N/A or DELETE if not current risk factor) Outcome: Progressing   Problem: Ischemic Stroke/TIA Tissue Perfusion: Goal: Complications of ischemic stroke/TIA will be minimized Outcome: Progressing   Problem: Coping: Goal: Will verbalize positive feelings about self Outcome: Progressing Goal: Will identify appropriate support needs Outcome: Progressing   Problem: Health Behavior/Discharge Planning: Goal: Ability to manage health-related needs will improve Outcome: Progressing Goal: Goals will be collaboratively established with patient/family Outcome: Progressing   Problem: Self-Care: Goal: Ability to participate in self-care as condition permits will improve Outcome: Progressing Goal: Verbalization of feelings and concerns over difficulty with self-care will improve Outcome: Progressing Goal: Ability to communicate needs accurately will improve Outcome: Progressing   Problem: Nutrition: Goal: Risk of aspiration will decrease Outcome: Progressing Goal: Dietary intake will improve Outcome: Progressing   Problem: Education: Goal: Understanding of CV disease, CV risk reduction, and recovery process will improve Outcome: Progressing Goal: Individualized Educational Video(s) Outcome: Progressing   Problem: Activity: Goal: Ability to return to baseline activity level will improve Outcome: Progressing   Problem: Cardiovascular: Goal: Ability to achieve and maintain adequate cardiovascular  perfusion will improve Outcome: Progressing Goal: Vascular access site(s) Level 0-1 will be maintained Outcome: Progressing   Problem: Health Behavior/Discharge Planning: Goal: Ability to safely manage health-related needs after discharge will improve Outcome: Progressing   Problem: Education: Goal: Ability to describe self-care measures that may prevent or decrease complications (Diabetes Survival Skills Education) will improve Outcome: Progressing Goal: Individualized Educational Video(s) Outcome: Progressing   Problem: Coping: Goal: Ability to adjust to condition or change in health will improve Outcome: Progressing   Problem: Fluid Volume: Goal: Ability to maintain a balanced intake and output will improve Outcome: Progressing   Problem: Health Behavior/Discharge Planning: Goal: Ability to identify and utilize available resources and services will improve Outcome: Progressing Goal: Ability to manage health-related needs will improve Outcome: Progressing   Problem: Metabolic: Goal: Ability to maintain appropriate glucose levels will improve Outcome: Progressing   Problem: Nutritional: Goal: Maintenance of adequate nutrition will improve Outcome: Progressing Goal: Progress toward achieving an optimal weight will improve Outcome: Progressing   Problem: Skin Integrity: Goal: Risk for impaired skin integrity will decrease Outcome: Progressing   Problem: Tissue Perfusion: Goal: Adequacy of tissue perfusion will improve Outcome: Progressing   Problem: Education: Goal: Knowledge of General Education information will improve Description: Including pain rating scale, medication(s)/side effects and non-pharmacologic comfort measures Outcome: Progressing   Problem: Health Behavior/Discharge Planning: Goal: Ability to manage health-related needs will improve Outcome: Progressing   Problem: Clinical Measurements: Goal: Ability to maintain clinical measurements within  normal limits will improve Outcome: Progressing Goal: Will remain free from infection Outcome: Progressing Goal: Diagnostic test results will improve Outcome: Progressing Goal: Respiratory complications will improve Outcome: Progressing Goal: Cardiovascular complication will be avoided Outcome: Progressing   Problem: Activity: Goal: Risk for activity intolerance will decrease Outcome: Progressing   Problem: Nutrition: Goal: Adequate nutrition will be maintained Outcome: Progressing   Problem: Coping: Goal: Level of anxiety will decrease Outcome: Progressing   Problem: Elimination: Goal: Will not experience complications related to bowel motility  Outcome: Progressing Goal: Will not experience complications related to urinary retention Outcome: Progressing   Problem: Pain Management: Goal: General experience of comfort will improve Outcome: Progressing   Problem: Safety: Goal: Ability to remain free from injury will improve Outcome: Progressing   Problem: Skin Integrity: Goal: Risk for impaired skin integrity will decrease Outcome: Progressing

## 2023-02-10 NOTE — Progress Notes (Addendum)
AM labs not drawn,  Phlebotomy 9908, 8032 called with no response. Will cont to try to get labs drawn.  0830: labs being drawn

## 2023-02-10 NOTE — Progress Notes (Addendum)
Nutrition Follow-up  DOCUMENTATION CODES:   Severe malnutrition in context of social or environmental circumstances  INTERVENTION:   Transition tube feeding to nocturnal via cortrak tube: Osmolite 1.5 @ 50 ml/h over 10 hours Run from 1700 - 0500 (500 ml per day) Prosource TF20 60 ml daily  Provides 830 kcal, 51 gm protein, 380 ml free water daily  MVI daily   Ensure Enlive BID  Encourage PO intake at meals  NUTRITION DIAGNOSIS:   Severe Malnutrition related to social / environmental circumstances (dementia) as evidenced by severe muscle depletion, severe fat depletion. Ongoing.   GOAL:   Patient will meet greater than or equal to 90% of their needs Met with TF at goal and diet advanced  MONITOR:   TF tolerance  REASON FOR ASSESSMENT:   Consult Enteral/tube feeding initiation and management  ASSESSMENT:   Pt with PMH of stroke, HTN, HLD, and dementia admitted with stroke s/p IR.   Pt discussed during ICU rounds and with RN.  Per RN pt has starting eating and doing well.  Pt unable to answer any questions due to hx of dementia. No family present during visit. Spoke with neurology plan to transition to nocturnal TF.   10/25 - s/p cortrak placement; tip gastric per xray  Admission weight on bedscale, 49.5 kg Current weight: 50.2 kg   Intake/Output Summary (Last 24 hours) at 02/10/2023 1555 Last data filed at 02/10/2023 1200 Gross per 24 hour  Intake 1180 ml  Output 1875 ml  Net -695 ml   Net IO Since Admission: 1,277.22 mL [02/10/23 1555]   Nutritionally Relevant Medications: Scheduled Meds:  feeding supplement (PROSource TF20)  60 mL Per Tube Daily   insulin aspart  0-15 Units Subcutaneous Q4H   levothyroxine  112 mcg Per Tube Q24H   multivitamin with minerals  1 tablet Per Tube Daily   mouth rinse  15 mL Mouth Rinse 4 times per day   potassium & sodium phosphates  2 packet Per Tube TID WC & HS   Continuous Infusions:  feeding supplement (OSMOLITE  1.5 CAL) 40 mL/hr at 02/10/23 0700   Labs Reviewed: HgbA1c 5.2 PO4 2.1  CBG's: 106-139   Diet Order:   Diet Order             DIET DYS 3 Room service appropriate? No; Fluid consistency: Thin  Diet effective now                   EDUCATION NEEDS:   No education needs have been identified at this time  Skin:  Skin Assessment: Reviewed RN Assessment  Last BM:  10/27  Height:   Ht Readings from Last 1 Encounters:  02/10/23 5\' 2"  (1.575 m)    Weight:   Wt Readings from Last 1 Encounters:  02/10/23 50.2 kg    BMI:  Body mass index is 20.24 kg/m.  Estimated Nutritional Needs:   Kcal:  1500-1700  Protein:  75-85 grams  Fluid:  >1.5 L/day  Cammy Copa., RD, LDN, CNSC See AMiON for contact information

## 2023-02-10 NOTE — Progress Notes (Signed)
Speech Language Pathology Treatment: Dysphagia;Cognitive-Linquistic  Patient Details Name: Kristen Parks MRN: [id number] DOB: May 16, 1940 Today's Date: 02/10/2023 Time: 1020-1040 SLP Time Calculation (min) (ACUTE ONLY): 20 min  Assessment / Plan / Recommendation Clinical Impression  Pt alert and positioned upright in bed. She did not follow commands given Max cueing. Observed pt with trials of thin liquids, purees, and regular texture solids with no overt s/s of dysphagia or aspiration. She appears to demonstrate improved sustained attention to POs and reduced signs of oral dysphagia this date. Pt able to take sequential sips of thin liquids via straw without clinical signs concerning for aspiration. She remains aversive to full oral cavity check, although visualization during pt speaking revealed overall clear oral cavity. Recommend upgrading diet to Dys 3 texture solids and continuing thin liquids.   Pt making attempts to communicate, although note attempts are generally unintelligible. She did answer yes/no questions in functional activities today, making her wants known during PO trials given Max cueing. Pt also making functional choices between desired POs by attempting to verbalize target word, although only successful with the initial phoneme. She makes attempts to participate in self-feeding with safety mitts donned, but made no further attempts when SLP removed safety mitts. Pt able to state her first name intelligibly x1. Overall, pt continues to present with severe expressive and receptive deficits and will likely benefit from ongoing SLP f/u to target these deficits. Will continue following.    HPI HPI: Pt is a 82 y.o. female who was brought into ED d/t  left-gaze preference, aphasia, dysarthria, right-sided weakness. CTH negative for hemorrhage, shows possible infarct posterior left subfrontal gyrus. CTA w/P shows: Left MCA M2 stenosis vs embolus with 12ml core infarct. Pt taken to IR for  thrombectomy. Failed [facility] Swallow Screen 02/07/23. PMH significant for HTN, dyslipidemia, anemia, hypothyroidism, Left corpus collosum infarction 2019, chronic small vessel disease, dementia.      SLP Plan  Continue with current plan of care      Recommendations for follow up therapy are one component of a multi-disciplinary discharge planning process, led by the attending physician.  Recommendations may be updated based on patient status, additional functional criteria and insurance authorization.    Recommendations  Diet recommendations: Dysphagia 3 (mechanical soft);Thin liquid Liquids provided via: Cup;Straw Medication Administration: Whole meds with liquid (or whole in purees PRN) Supervision: Staff to assist with self feeding;Full supervision/cueing for compensatory strategies Compensations: Minimize environmental distractions;Slow rate;Small sips/bites;Follow solids with liquid Postural Changes and/or Swallow Maneuvers: Seated upright 90 degrees                  Oral care BID;Staff/trained caregiver to provide oral care   Frequent or constant Supervision/Assistance Cognitive communication deficit (R41.841);Aphasia (R47.01);Dysphagia, unspecified (R13.10)     Continue with current plan of care     Gwynneth Aliment, M.A., CF-SLP Speech Language Pathology, Acute Rehabilitation Services  Secure Chat preferred [phone]   02/10/2023, 10:47 AM

## 2023-02-10 NOTE — Progress Notes (Signed)
Pt transferred to 3 W 16. Pt's daughter and 3 W Charity fundraiser at bedside.

## 2023-02-11 ENCOUNTER — Inpatient Hospital Stay (HOSPITAL_COMMUNITY): Payer: Medicare HMO

## 2023-02-11 DIAGNOSIS — I6340 Cerebral infarction due to embolism of unspecified cerebral artery: Secondary | ICD-10-CM | POA: Diagnosis not present

## 2023-02-11 LAB — GLUCOSE, CAPILLARY
Glucose-Capillary: 127 mg/dL — ABNORMAL HIGH (ref 70–99)
Glucose-Capillary: 108 mg/dL — ABNORMAL HIGH (ref 70–99)
Glucose-Capillary: 109 mg/dL — ABNORMAL HIGH (ref 70–99)
Glucose-Capillary: 110 mg/dL — ABNORMAL HIGH (ref 70–99)
Glucose-Capillary: 112 mg/dL — ABNORMAL HIGH (ref 70–99)

## 2023-02-11 LAB — BASIC METABOLIC PANEL
Anion gap: 9 (ref 5–15)
BUN: 21 mg/dL (ref 8–23)
CO2: 23 mmol/L (ref 22–32)
Calcium: 9.6 mg/dL (ref 8.9–10.3)
Chloride: 107 mmol/L (ref 98–111)
Creatinine, Ser: 0.64 mg/dL (ref 0.44–1.00)
GFR, Estimated: >60 mL/min (ref >60)
Glucose, Bld: 117 mg/dL — ABNORMAL HIGH (ref 70–99)
Potassium: 4.1 mmol/L (ref 3.5–5.1)
Sodium: 139 mmol/L (ref 135–145)

## 2023-02-11 LAB — CBC
HCT: 37.3 % (ref 36.0–46.0)
Hemoglobin: 12.4 g/dL (ref 12.0–15.0)
MCH: 30.8 pg (ref 26.0–34.0)
MCHC: 33.2 g/dL (ref 30.0–36.0)
MCV: 92.8 fL (ref 80.0–100.0)
Platelets: 229 10*3/uL (ref 150–400)
RBC: 4.02 MIL/uL (ref 3.87–5.11)
RDW: 12.6 % (ref 11.5–15.5)
WBC: 10.8 10*3/uL — ABNORMAL HIGH (ref 4.0–10.5)
nRBC: 0 % (ref 0.0–0.2)

## 2023-02-11 MED ORDER — ASPIRIN 81 MG PO CHEW
81.0000 mg | CHEWABLE_TABLET | Freq: Every day | ORAL | Status: DC
  Administered 2023-02-11 – 2023-02-14 (×4): 81 mg via ORAL
  Filled 2023-02-11 (×4): qty 1

## 2023-02-11 MED ORDER — LEVOTHYROXINE SODIUM 112 MCG PO TABS
112.0000 ug | ORAL_TABLET | ORAL | Status: DC
  Administered 2023-02-12 – 2023-02-14 (×3): 112 ug via ORAL
  Filled 2023-02-11 (×3): qty 1

## 2023-02-11 MED ORDER — ROSUVASTATIN CALCIUM 20 MG PO TABS
20.0000 mg | ORAL_TABLET | Freq: Every day | ORAL | Status: DC
  Administered 2023-02-12 – 2023-02-14 (×3): 20 mg via ORAL
  Filled 2023-02-11 (×3): qty 1

## 2023-02-11 MED ORDER — ADULT MULTIVITAMIN W/MINERALS CH
1.0000 | ORAL_TABLET | Freq: Every day | ORAL | Status: DC
  Administered 2023-02-12 – 2023-02-14 (×3): 1 via ORAL
  Filled 2023-02-11 (×3): qty 1

## 2023-02-11 NOTE — Progress Notes (Signed)
Inpatient Rehabilitation Admissions Coordinator   I met with patient and her daughter at bedside. Daughter confirms that they prefer SNF level rehab at this time. She is aware that cortrak will be barrier to SNF placement. I will alert acute team and TOC. We will sign off.  Please refer to Dr Wynn Banker consult yesterday.  Ottie Glazier, RN, MSN Rehab Admissions Coordinator [phone] 02/11/2023 11:23 AM

## 2023-02-11 NOTE — Progress Notes (Signed)
Physical Therapy Treatment Patient Details Name: Kristen Parks MRN: [id number] DOB: Aug 09, 1940 Today's Date: 02/11/2023   History of Present Illness Kristen Parks is a 82 y.o. female with PMH significant for HTN, dyslipidemia, anemia, hypothyroidism, Left corpus collosum infarction 2019, chronic small vessel disease, dementia who was brought into ED d/t  left-gaze preference, aphasia, dysarthria, right-sided weakness. CTH negative for hemorrhage, shows possible infarct posterior left subfrontal gyrus. CTA w/P shows: Left MCA M2 stenosis vs embolus with 12ml core infarct. Patient taken to IR for thrombectomy.    PT Comments  Pt is slowly progressing towards goals. Pt was able to increase gait distance this session with Mod A and continue with L gaze preference. Pt continues to require 2 person sit to stand due to L gaze preference and what appears to be possible R sided neglect; pt needs one person on the R to assist with the RUE and help with initiating movement. Due to pt current functional status, home set up and available assistance at home recommending skilled physical therapy services in order to decrease risk for falls, injury, decrease burden of care, decrease risk for immobility and re-hospitalization.     If plan is discharge home, recommend the following: A lot of help with walking and/or transfers;Assist for transportation;Help with stairs or ramp for entrance;Supervision due to cognitive status     Equipment Recommendations  Other (comment) (defer to post acute)       Precautions / Restrictions Precautions Precautions: Fall;Other (comment) Precaution Comments: cortrak, bilat mitts Restrictions Weight Bearing Restrictions: No     Mobility  Bed Mobility   General bed mobility comments: Pt received in recliner and left in recliner at end of session to eat lunch    Transfers Overall transfer level: Needs assistance Equipment used: Rolling walker (2 wheels) Transfers: Sit  to/from Stand Sit to Stand: Min assist, Mod assist, +2 physical assistance, +2 safety/equipment         General transfer comment: heavy verbal cues for sequencing and initiation. Daughter assisted as a +2    Ambulation/Gait Ambulation/Gait assistance: Mod assist Gait Distance (Feet): 12 Feet Assistive device: Rolling walker (2 wheels) Gait Pattern/deviations: Step-to pattern, Step-through pattern, Decreased step length - right Gait velocity: decreased Gait velocity interpretation: <1.31 ft/sec, indicative of household ambulator   General Gait Details: Decreased mobility of the RLE, pt was running into objects with AD on the R. Mod A for balance, facilitatin of Wgt shifting and progression of the RLE, multi modal cueing for scanning to the R    Modified Rankin (Stroke Patients Only) Modified Rankin (Stroke Patients Only) Pre-Morbid Rankin Score: Slight disability Modified Rankin: Severe disability     Balance Overall balance assessment: Needs assistance Sitting-balance support: Feet supported, Bilateral upper extremity supported Sitting balance-Leahy Scale: Fair Sitting balance - Comments: sitting edge of recliner without LOB   Standing balance support: Bilateral upper extremity supported, During functional activity, Reliant on assistive device for balance Standing balance-Leahy Scale: Poor Standing balance comment: heavy reliance on external support        Cognition Arousal: Alert Behavior During Therapy: Flat affect Overall Cognitive Status: Difficult to assess       General Comments: Eyes open. Able to attend briefly to therapist. Following simple commands < 25% of trials. Soft voice. Mumbling, nonsensical speech.           General Comments General comments (skin integrity, edema, etc.): L gaze preference. Daughter present throughout session and very helpful. Pt urinated on the floor during  gait and linens in the recliner were wet from before therapist came into  the room. Floor was cleaned, pt clothes changed and recliner linens changed and wiped. Nursing was notified.      Pertinent Vitals/Pain Pain Assessment Pain Assessment: Faces Faces Pain Scale: No hurt Breathing: normal Negative Vocalization: none Facial Expression: smiling or inexpressive Body Language: relaxed Consolability: no need to console PAINAD Score: 0     PT Goals (current goals can now be found in the care plan section) Acute Rehab PT Goals Patient Stated Goal: home per family PT Goal Formulation: With patient/family Time For Goal Achievement: 02/22/23 Potential to Achieve Goals: Good Progress towards PT goals: Progressing toward goals    Frequency    Min 1X/week      PT Plan  Updated discharge recommendations       AM-PAC PT "6 Clicks" Mobility   Outcome Measure  Help needed turning from your back to your side while in a flat bed without using bedrails?: A Lot Help needed moving from lying on your back to sitting on the side of a flat bed without using bedrails?: A Lot Help needed moving to and from a bed to a chair (including a wheelchair)?: A Lot Help needed standing up from a chair using your arms (e.g., wheelchair or bedside chair)?: A Lot Help needed to walk in hospital room?: A Lot Help needed climbing 3-5 steps with a railing? : Total 6 Click Score: 11    End of Session Equipment Utilized During Treatment: Gait belt Activity Tolerance: Patient tolerated treatment well;Patient limited by fatigue Patient left: in chair;with call bell/phone within reach;with chair alarm set;with family/visitor present Nurse Communication: Mobility status PT Visit Diagnosis: Other abnormalities of gait and mobility (R26.89)     Time: 1320-1350 PT Time Calculation (min) (ACUTE ONLY): 30 min  Charges:    $Gait Training: 8-22 mins $Therapeutic Activity: 8-22 mins PT General Charges $$ ACUTE PT VISIT: 1 Visit                     Harrel Carina, DPT, CLT   Acute Rehabilitation Services Office: [phone] (Secure chat preferred)    [name] 02/11/2023, 2:31 PM

## 2023-02-11 NOTE — Plan of Care (Signed)
Pt alert to self. Denies any pain of discomfort. IV is patent and saline locked. Pt up to chair this shift. Continues working with PT/OT. Daughter at bedside. Will continue with current plan of care.  Problem: Education: Goal: Knowledge of disease or condition will improve Outcome: Progressing Goal: Knowledge of secondary prevention will improve (MUST DOCUMENT ALL) Outcome: Progressing Goal: Knowledge of patient specific risk factors will improve Loraine Leriche N/A or DELETE if not current risk factor) Outcome: Progressing   Problem: Ischemic Stroke/TIA Tissue Perfusion: Goal: Complications of ischemic stroke/TIA will be minimized Outcome: Progressing   Problem: Coping: Goal: Will verbalize positive feelings about self Outcome: Progressing Goal: Will identify appropriate support needs Outcome: Progressing   Problem: Health Behavior/Discharge Planning: Goal: Ability to manage health-related needs will improve Outcome: Progressing Goal: Goals will be collaboratively established with patient/family Outcome: Progressing   Problem: Self-Care: Goal: Ability to participate in self-care as condition permits will improve Outcome: Progressing Goal: Verbalization of feelings and concerns over difficulty with self-care will improve Outcome: Progressing Goal: Ability to communicate needs accurately will improve Outcome: Progressing   Problem: Nutrition: Goal: Risk of aspiration will decrease Outcome: Progressing Goal: Dietary intake will improve Outcome: Progressing   Problem: Education: Goal: Understanding of CV disease, CV risk reduction, and recovery process will improve Outcome: Progressing Goal: Individualized Educational Video(s) Outcome: Progressing   Problem: Activity: Goal: Ability to return to baseline activity level will improve Outcome: Progressing   Problem: Cardiovascular: Goal: Ability to achieve and maintain adequate cardiovascular perfusion will improve Outcome:  Progressing Goal: Vascular access site(s) Level 0-1 will be maintained Outcome: Progressing   Problem: Health Behavior/Discharge Planning: Goal: Ability to safely manage health-related needs after discharge will improve Outcome: Progressing   Problem: Education: Goal: Ability to describe self-care measures that may prevent or decrease complications (Diabetes Survival Skills Education) will improve Outcome: Progressing Goal: Individualized Educational Video(s) Outcome: Progressing   Problem: Coping: Goal: Ability to adjust to condition or change in health will improve Outcome: Progressing   Problem: Fluid Volume: Goal: Ability to maintain a balanced intake and output will improve Outcome: Progressing   Problem: Health Behavior/Discharge Planning: Goal: Ability to identify and utilize available resources and services will improve Outcome: Progressing Goal: Ability to manage health-related needs will improve Outcome: Progressing   Problem: Metabolic: Goal: Ability to maintain appropriate glucose levels will improve Outcome: Progressing   Problem: Nutritional: Goal: Maintenance of adequate nutrition will improve Outcome: Progressing Goal: Progress toward achieving an optimal weight will improve Outcome: Progressing   Problem: Skin Integrity: Goal: Risk for impaired skin integrity will decrease Outcome: Progressing   Problem: Tissue Perfusion: Goal: Adequacy of tissue perfusion will improve Outcome: Progressing   Problem: Education: Goal: Knowledge of General Education information will improve Description: Including pain rating scale, medication(s)/side effects and non-pharmacologic comfort measures Outcome: Progressing   Problem: Health Behavior/Discharge Planning: Goal: Ability to manage health-related needs will improve Outcome: Progressing   Problem: Clinical Measurements: Goal: Ability to maintain clinical measurements within normal limits will  improve Outcome: Progressing Goal: Will remain free from infection Outcome: Progressing Goal: Diagnostic test results will improve Outcome: Progressing Goal: Respiratory complications will improve Outcome: Progressing Goal: Cardiovascular complication will be avoided Outcome: Progressing   Problem: Activity: Goal: Risk for activity intolerance will decrease Outcome: Progressing   Problem: Nutrition: Goal: Adequate nutrition will be maintained Outcome: Progressing   Problem: Coping: Goal: Level of anxiety will decrease Outcome: Progressing   Problem: Elimination: Goal: Will not experience complications related to bowel motility Outcome: Progressing  Goal: Will not experience complications related to urinary retention Outcome: Progressing   Problem: Pain Management: Goal: General experience of comfort will improve Outcome: Progressing   Problem: Safety: Goal: Ability to remain free from injury will improve Outcome: Progressing   Problem: Skin Integrity: Goal: Risk for impaired skin integrity will decrease Outcome: Progressing

## 2023-02-11 NOTE — Progress Notes (Addendum)
STROKE TEAM PROGRESS NOTE   SUBJECTIVE (INTERVAL HISTORY) Daughter at the bedside, discussed they prefer SNF placement. Cortrak in place, increased PO intake today- d/c cortrak tomorrow if she continues eating well today. Patient sitting up in chair in NAD.  Vitals are stable.  Neurological exam is improved with increased movement of RUE.  CT head stable Start ASA 81mg  today  OBJECTIVE Temp:  [97.6 F (36.4 C)-98.4 F (36.9 C)] 98.2 F (36.8 C) (10/29 1052) Pulse Rate:  [76-84] 81 (10/29 1052) Cardiac Rhythm: Normal sinus rhythm (10/29 0700) Resp:  [15-20] 18 (10/29 1052) BP: (113-145)/(72-89) 126/72 (10/29 1052) SpO2:  [97 %-100 %] 98 % (10/29 1052)  Recent Labs  Lab 02/10/23 1551 02/10/23 1952 02/10/23 2342 02/11/23 0329 02/11/23 0832  GLUCAP 112* 118* 138* 127* 108*   Recent Labs  Lab 02/07/23 1045 02/07/23 1051 02/07/23 1821 02/08/23 0631 02/08/23 1744 02/09/23 0709 02/10/23 0840 02/11/23 0531  NA 144 144  --  141  --  139 134* 139  K 3.6 3.6  --  4.2  --  3.5 3.9 4.1  CL 112* 110  --  111  --  106 107 107  CO2 22  --   --  18*  --  22 18* 23  GLUCOSE 155* 155*  --  143*  --  112* 142* 117*  BUN 20 24*  --  17  --  15 18 21   CREATININE 0.82 0.80  --  0.73  --  0.59 0.74 0.64  CALCIUM 10.0  --   --  9.3  --  9.2 9.2 9.6  MG  --   --  1.8 1.8 2.0 1.7 1.9  --   PHOS  --   --  2.5 2.4* 1.7* 2.3* 2.1*  --    Recent Labs  Lab 02/07/23 1045  AST 20  ALT 11  ALKPHOS 180*  BILITOT 0.7  PROT 7.5  ALBUMIN 3.8   Recent Labs  Lab 02/07/23 1045 02/07/23 1051 02/08/23 0631 02/09/23 0709 02/10/23 0840 02/11/23 0531  WBC 6.5  --  9.6 7.8 12.5* 10.8*  NEUTROABS 4.8  --   --   --   --   --   HGB 14.6 15.6* 12.4 12.7 13.7 12.4  HCT 44.2 46.0 36.2 36.9 39.7 37.3  MCV 94.4  --  93.3 90.7 91.3 92.8  PLT 291  --  210 231 224 229   No results for input(s): "CKTOTAL", "CKMB", "CKMBINDEX", "TROPONINI" in the last 168 hours. No results for input(s): "LABPROT",  "INR" in the last 72 hours.  No results for input(s): "COLORURINE", "LABSPEC", "PHURINE", "GLUCOSEU", "HGBUR", "BILIRUBINUR", "KETONESUR", "PROTEINUR", "UROBILINOGEN", "NITRITE", "LEUKOCYTESUR" in the last 72 hours.  Invalid input(s): "APPERANCEUR"     Component Value Date/Time   CHOL 157 02/08/2023 0630   TRIG 100 02/08/2023 0630   HDL 36 (L) 02/08/2023 0630   CHOLHDL 4.4 02/08/2023 0630   VLDL 20 02/08/2023 0630   LDLCALC 101 (H) 02/08/2023 0630   Lab Results  Component Value Date   HGBA1C 5.2 02/07/2023   No results found for: "LABOPIA", "COCAINSCRNUR", "LABBENZ", "AMPHETMU", "THCU", "LABBARB"  Recent Labs  Lab 02/07/23 1029  ETH <10    I have personally reviewed the radiological images below and agree with the radiology interpretations.  VAS [place] LOWER EXTREMITY VENOUS (DVT)  Result Date: 02/10/2023  Lower Venous DVT Study Patient Name:  Kristen Parks  Date of Exam:   02/09/2023 Medical Rec #: [id number]  Accession #:    [id number] Date of Birth: [birth year]        Patient Gender: F Patient Age:   82 years Exam Location:  [facility] Procedure:      VAS [place] LOWER EXTREMITY VENOUS (DVT) Referring Phys: Scheryl Marten Elnora Quizon --------------------------------------------------------------------------------  Indications: Stroke.  Limitations: Bandages, altered mental status, poor hemodynamically positioning. Comparison Study: No prior study on file Performing Technologist: Sherren Kerns RVS  Examination Guidelines: A complete evaluation includes B-mode imaging, spectral Doppler, color Doppler, and power Doppler as needed of all accessible portions of each vessel. Bilateral testing is considered an integral part of a complete examination. Limited examinations for reoccurring indications may be performed as noted. The reflux portion of the exam is performed with the patient in reverse Trendelenburg.  +---------+---------------+---------+-----------+----------+-------------------+ RIGHT     CompressibilityPhasicitySpontaneityPropertiesThrombus Aging      +---------+---------------+---------+-----------+----------+-------------------+ CFV      Full           Yes      Yes                                      +---------+---------------+---------+-----------+----------+-------------------+ SFJ      Full                                                             +---------+---------------+---------+-----------+----------+-------------------+ FV Prox  Full                                                             +---------+---------------+---------+-----------+----------+-------------------+ FV Mid   Full                                                             +---------+---------------+---------+-----------+----------+-------------------+ FV DistalFull                                                             +---------+---------------+---------+-----------+----------+-------------------+ PFV      Full                                                             +---------+---------------+---------+-----------+----------+-------------------+ POP      Full           Yes      No                                       +---------+---------------+---------+-----------+----------+-------------------+  PTV      Full                                                             +---------+---------------+---------+-----------+----------+-------------------+ PERO                                                  Not well visualized +---------+---------------+---------+-----------+----------+-------------------+   +---------+---------------+---------+-----------+----------+-------------------+ LEFT     CompressibilityPhasicitySpontaneityPropertiesThrombus Aging      +---------+---------------+---------+-----------+----------+-------------------+ CFV      Full           Yes      Yes                                       +---------+---------------+---------+-----------+----------+-------------------+ SFJ      Full                                                             +---------+---------------+---------+-----------+----------+-------------------+ FV Prox  Full                                                             +---------+---------------+---------+-----------+----------+-------------------+ FV Mid   Full                                                             +---------+---------------+---------+-----------+----------+-------------------+ FV DistalFull                                                             +---------+---------------+---------+-----------+----------+-------------------+ PFV      Full                                                             +---------+---------------+---------+-----------+----------+-------------------+ POP                     No       Yes                  patent by color and  Doppler             +---------+---------------+---------+-----------+----------+-------------------+ PTV      Full                                                             +---------+---------------+---------+-----------+----------+-------------------+ PERO                                                  Not well visualized +---------+---------------+---------+-----------+----------+-------------------+     *See table(s) above for measurements and observations. Electronically signed by Sherald Hess MD on 02/10/2023 at 11:13:31 AM.    Final    ECHOCARDIOGRAM COMPLETE  Result Date: 02/08/2023    ECHOCARDIOGRAM REPORT   Patient Name:   Kristen Parks Date of Exam: 02/08/2023 Medical Rec #:  [id number]      Height:       62.0 in Accession #:    [id number]     Weight:       109.8 lb Date of Birth:  July 22, 1940       BSA:          1.482 m Patient Age:    82 years       BP:            132/77 mmHg Patient Gender: F              HR:           76 bpm. Exam Location:  Inpatient Procedure: 2D Echo, Cardiac Doppler and Color Doppler Indications:    Stroke 434.91/I163.9  History:        Patient has prior history of Echocardiogram examinations, most                 recent 04/15/2018. Stroke; Risk Factors:Hypertension and                 Dyslipidemia. Migraine.  Sonographer:    Lucendia Herrlich RCS Referring Phys: Amada Jupiter, MCNEILL, P  Sonographer Comments: Image acquisition challenging due to patient body habitus. IMPRESSIONS  1. Left ventricular ejection fraction, by estimation, is 55 to 60%. The left ventricle has normal function. The left ventricle has no regional wall motion abnormalities. Left ventricular diastolic parameters are consistent with Grade I diastolic dysfunction (impaired relaxation).  2. Right ventricular systolic function is normal. The right ventricular size is normal. Tricuspid regurgitation signal is inadequate for assessing PA pressure. The estimated right ventricular systolic pressure is 18.5 mmHg.  3. The mitral valve is grossly normal. No evidence of mitral valve regurgitation. No evidence of mitral stenosis.  4. The aortic valve is tricuspid. Aortic valve regurgitation is not visualized. No aortic stenosis is present.  5. The inferior vena cava is normal in size with greater than 50% respiratory variability, suggesting right atrial pressure of 3 mmHg. Comparison(s): No significant change from prior study. FINDINGS  Left Ventricle: Left ventricular ejection fraction, by estimation, is 55 to 60%. The left ventricle has normal function. The left ventricle has no regional wall motion abnormalities. The left ventricular internal cavity size was normal in size. There is  no left ventricular hypertrophy. Left ventricular diastolic function could not be evaluated due  to nondiagnostic images. Left ventricular diastolic parameters are consistent with Grade I diastolic dysfunction  (impaired relaxation). Right Ventricle: The right ventricular size is normal. No increase in right ventricular wall thickness. Right ventricular systolic function is normal. Tricuspid regurgitation signal is inadequate for assessing PA pressure. The tricuspid regurgitant velocity is 1.97 m/s, and with an assumed right atrial pressure of 3 mmHg, the estimated right ventricular systolic pressure is 18.5 mmHg. Left Atrium: Left atrial size was normal in size. Right Atrium: Right atrial size was normal in size. Pericardium: There is no evidence of pericardial effusion. Mitral Valve: The mitral valve is grossly normal. No evidence of mitral valve regurgitation. No evidence of mitral valve stenosis. Tricuspid Valve: The tricuspid valve is grossly normal. Tricuspid valve regurgitation is trivial. No evidence of tricuspid stenosis. Aortic Valve: The aortic valve is tricuspid. Aortic valve regurgitation is not visualized. No aortic stenosis is present. Aortic valve peak gradient measures 6.1 mmHg. Pulmonic Valve: The pulmonic valve was grossly normal. Pulmonic valve regurgitation is not visualized. No evidence of pulmonic stenosis. Aorta: The aortic root and ascending aorta are structurally normal, with no evidence of dilitation. Venous: The inferior vena cava is normal in size with greater than 50% respiratory variability, suggesting right atrial pressure of 3 mmHg. IAS/Shunts: The atrial septum is grossly normal.  LEFT VENTRICLE PLAX 2D LVIDd:         3.30 cm   Diastology LVIDs:         2.10 cm   LV e' medial:    4.62 cm/s LV PW:         0.90 cm   LV E/e' medial:  10.6 LV IVS:        0.90 cm   LV e' lateral:   9.01 cm/s LVOT diam:     2.00 cm   LV E/e' lateral: 5.4 LV SV:         41 LV SV Index:   28 LVOT Area:     3.14 cm  RIGHT VENTRICLE             IVC RV S prime:     14.90 cm/s  IVC diam: 1.80 cm TAPSE (M-mode): 1.8 cm LEFT ATRIUM           Index LA diam:      2.40 cm 1.62 cm/m LA Vol (A2C): 9.8 ml  6.59 ml/m   AORTIC VALVE AV Area (Vmax): 1.77 cm AV Vmax:        123.00 cm/s AV Peak Grad:   6.1 mmHg LVOT Vmax:      69.22 cm/s LVOT Vmean:     43.400 cm/s LVOT VTI:       0.130 m  AORTA Ao Root diam: 2.80 cm Ao Asc diam:  3.10 cm MITRAL VALVE               TRICUSPID VALVE MV Area (PHT): 4.60 cm    TR Peak grad:   15.5 mmHg MV Decel Time: 165 msec    TR Vmax:        197.00 cm/s MV E velocity: 49.00 cm/s MV A velocity: 67.40 cm/s  SHUNTS MV E/A ratio:  0.73        Systemic VTI:  0.13 m                            Systemic Diam: 2.00 cm Lennie Odor MD Electronically signed by Lennie Odor MD Signature Date/Time: 02/08/2023/9:48:49  AM    Final    CT HEAD WO CONTRAST ( )  Result Date: 02/08/2023 CLINICAL DATA:  82 year old female code stroke presentation yesterday. Left MCA abnormality, left ACA and MCA branch occlusions and endovascular reperfusion. Posterior left MCA branch extravasation and subsequent blue and coil embolization. EXAM: CT HEAD WITHOUT CONTRAST TECHNIQUE: Contiguous axial images were obtained from the base of the skull through the vertex without intravenous contrast. RADIATION DOSE REDUCTION: This exam was performed according to the departmental dose-optimization program which includes automated exposure control, adjustment of the mA and/or kV according to patient size and/or use of iterative reconstruction technique. COMPARISON:  Head CT 1039 hours yesterday. FINDINGS: Brain: Small volume subarachnoid hemorrhage in the left sylvian fissure primarily. No intraventricular blood. No ventriculomegaly. No midline shift. Left MCA cytotoxic edema most apparent in the posterior division, lateral perirolandic area series 3, image 18. Additional confluent cytotoxic edema at the posterior ACA/MCA watershed, left vertex and parietal lobe series 3, image 23. Elsewhere gray-white differentiation appears stable including advanced chronic white matter hypodensity, chronic left cerebellar infarcts. No other acute  intracranial hemorrhage identified. Basilar cisterns remain patent. Vascular: Posterior left MCA region embolization sequelae. Skull: Stable, intact. Sinuses/Orbits: Visualized paranasal sinuses and mastoids are stable and well aerated. Other: Left nasoenteric tube now in place. No acute orbit or scalp soft tissue finding. Left gaze persists. IMPRESSION: 1. Cytotoxic edema in the posterior left MCA and left distal ACA/MCA watershed area. Sequelae of posterior left MCA branch embolization. 2. Small volume acute subarachnoid hemorrhage, primarily in the left sylvian fissure. Heidelberg classification 3c: Subarachnoid hemorrhage. 3. No midline shift or significant intracranial mass effect. No IVH or ventriculomegaly. 4. Underlying chronic ischemia. Electronically Signed   By: Odessa Fleming M.D.   On: 02/08/2023 05:22   MR BRAIN WO CONTRAST  Result Date: 02/07/2023 CLINICAL DATA:  MCA branch occlusion status post thrombectomy EXAM: MRI HEAD WITHOUT CONTRAST TECHNIQUE: Multiplanar, multiecho pulse sequences of the brain and surrounding structures were obtained without intravenous contrast. COMPARISON:  Same-day CT/CTA head and neck FINDINGS: Brain: There is moderate volume patchy diffusion restriction in the left cerebral hemisphere involving the caudate head, corona radiata, insula, parietal lobe, and occipital lobe primarily in the MCA and MCA watershed distributions. There is petechial hemorrhage in the parasagittal parietal lobe (12-45). There is FLAIR signal abnormality and SWI signal dropout in the basal cisterns extending to the left MCA cistern, sylvian fissure, and over the parietal lobe likely reflecting a combination of subarachnoid hemorrhage and contrast extravasation. There is no significant mass effect or midline shift. Background parenchymal volume is within expected limits for age. There is advanced background chronic small-vessel ischemic change with remote infarcts in the bilateral thalami and left  cerebellar hemisphere. The pituitary and suprasellar region are grossly unremarkable. Vascular: The major flow voids are present. Skull and upper cervical spine: There is no definite marrow signal abnormality. Sinuses/Orbits: The paranasal sinuses are clear. Bilateral lens implants are in place. The globes and orbits are otherwise unremarkable. Other: The mastoid air cells and middle ear cavities are clear. IMPRESSION: 1. Moderate volume patchy acute infarcts in the left cerebral hemisphere primarily in the MCA and MCA watershed distributions with petechial hemorrhage in the parietal lobe but no significant mass effect or midline shift. 2. Probable combination of extravasated contrast and subarachnoid hemorrhage in the basal cisterns and over the left cerebral hemisphere. Recommend attention on subsequent head CTs. Electronically Signed   By: Lesia Hausen M.D.   On: 02/07/2023 18:30  DG Abd Portable 1V  Result Date: 02/07/2023 CLINICAL DATA:  Nasoenteric feeding tube placement EXAM: PORTABLE ABDOMEN - 1 VIEW COMPARISON:  None Available. FINDINGS: Nasoenteric feeding tube tip overlies the expected distal body of the stomach. The abdominal gas pattern is nonspecific due to a paucity of intra-abdominal gas. Cholecystectomy clips are seen in the right upper quadrant. Contrast is seen within nondilated renal collecting systems bilaterally. IMPRESSION: 1. Nasoenteric feeding tube tip within the distal body of the stomach. Electronically Signed   By: Helyn Numbers M.D.   On: 02/07/2023 17:59   CT ANGIO HEAD NECK W WO CM W PERF (CODE STROKE)  Result Date: 02/07/2023 CLINICAL DATA:  Neuro deficit, acute, stroke suspected. Left-sided visual preference and slurred speech. EXAM: CT ANGIOGRAPHY HEAD AND NECK CT PERFUSION BRAIN TECHNIQUE: Multidetector CT imaging of the head and neck was performed using the standard protocol during bolus administration of intravenous contrast. Multiplanar CT image reconstructions and  MIPs were obtained to evaluate the vascular anatomy. Carotid stenosis measurements (when applicable) are obtained utilizing NASCET criteria, using the distal internal carotid diameter as the denominator. Multiphase CT imaging of the brain was performed following IV bolus contrast injection. Subsequent parametric perfusion maps were calculated using RAPID software. RADIATION DOSE REDUCTION: This exam was performed according to the departmental dose-optimization program which includes automated exposure control, adjustment of the mA and/or kV according to patient size and/or use of iterative reconstruction technique. CONTRAST:  OMNIPAQUE IOHEXOL 350 MG/ML SOLN COMPARISON:  Head CT 02/07/2023. CTA neck 04/15/2018. MRI/MRA head 04/14/2018. FINDINGS: CTA NECK FINDINGS Aortic arch: Three-vessel arch configuration. Arch vessel origins are patent. Right carotid system: No evidence of dissection, stenosis (50% or greater) or occlusion. Left carotid system: No evidence of dissection, stenosis (50% or greater) or occlusion. Vertebral arteries: Left dominant. No evidence of dissection, stenosis (50% or greater) or occlusion. Skeleton: Mild cervical spondylosis without high-grade spinal canal stenosis. 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 Upper chest: 11 mm solid nodule in the superior segment of the left lower lobe (axial image 186 series 5). Review of the MIP images confirms the above findings CTA HEAD FINDINGS Anterior circulation: Intracranial ICAs are patent without stenosis or aneurysm. Proximal high-grade stenosis versus subocclusive embolus of the left MCA superior M2 division with distal reconstitution (axial images 98-103 series 8), new from the prior MRA. Distal branches are symmetric. Posterior circulation: Normal basilar artery. The SCAs, AICAs and PICAs are patent proximally.  The PCAs are patent proximally without stenosis or aneurysm. Distal branches are symmetric. Venous sinuses: Early phase of contrast. Anatomic variants: Hypoplastic right A1 segment. Azygous A2 segment. Persistent fetal origin of the right PCA with hypoplastic right P1 segment. Review of the MIP images confirms the above findings CT Brain Perfusion Findings: CBF (<30%) Volume: 12mL estimated by automated post processing. However, given significant motion during the first pass arterial bolus, this may be an overestimate. Perfusion (Tmax>6.0s) volume: 72mL estimated by automated post processing. However, given significant motion during the first pass arterial bolus, this may be an overestimate. Mismatch Volume: 60mL Infarction Location:Left frontoparietal junction, MCA territory. IMPRESSION: 1. Proximal high-grade stenosis versus subocclusive embolus of the left MCA superior M2 division with distal reconstitution, new from the prior MRA. 2. CT perfusion demonstrates a 12 mL core infarct in the left frontoparietal junction, MCA territory. However, given significant motion during the first pass arterial bolus, this  may be an overestimate. 3. No hemodynamically significant stenosis in the neck. 4. 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 5. A 11 mm solid nodule in the superior segment of the left lower lobe. Consider one of the following in 3 months for both low-risk and high-risk individuals: (a) repeat chest CT, (b) follow-up PET-CT, or (c) tissue sampling. This recommendation follows the consensus statement: Guidelines for Management of Incidental Pulmonary Nodules Detected on CT Images: From the Fleischner Society 2017; Radiology 2017; 284:228-243. Code stroke imaging results were communicated on 02/07/2023 at 11:17 am to provider Dr. Amada Jupiter via telephone, who verbally acknowledged these results. Electronically Signed    By: Orvan Falconer M.D.   On: 02/07/2023 11:32   CT HEAD CODE STROKE WO CONTRAST  Result Date: 02/07/2023 CLINICAL DATA:  Code stroke. Neuro deficit, acute, stroke suspected. Left visual preference and slurred speech. EXAM: CT HEAD WITHOUT CONTRAST TECHNIQUE: Contiguous axial images were obtained from the base of the skull through the vertex without intravenous contrast. RADIATION DOSE REDUCTION: This exam was performed according to the departmental dose-optimization program which includes automated exposure control, adjustment of the mA and/or kV according to patient size and/or use of iterative reconstruction technique. COMPARISON:  Head CT 04/28/2021. FINDINGS: Brain: No acute hemorrhage. Possible new loss of gray-white differentiation along the posterior aspect of the left subfrontal gyrus (sagittal image 41 series 6). Background of severe chronic small-vessel disease with old infarct along the left middle frontal gyrus and old perforator infarcts in the bilateral thalami and left cerebellar hemisphere. No hydrocephalus or extra-axial collection. No mass effect or midline shift. Vascular: No hyperdense vessel or unexpected calcification. Skull: No calvarial fracture or suspicious bone lesion. Skull base is unremarkable. Sinuses/Orbits: No acute finding. Other: None. ASPECTS [facility] Stroke Program Early CT Score) - Ganglionic level infarction (caudate, lentiform nuclei, internal capsule, insula, M1-M3 cortex): 7 - Supraganglionic infarction (M4-M6 cortex): 2 Total score (0-10 with 10 being normal): 9 IMPRESSION: 1. Possible new loss of gray-white differentiation along the posterior aspect of the left subfrontal gyrus, which could represent an acute infarct. No acute hemorrhage. 2. Background of severe chronic small-vessel disease with old infarct along the left middle frontal gyrus and old perforator infarcts in the bilateral thalami and left cerebellar hemisphere. Code stroke imaging results were  communicated on 02/07/2023 at 10:47 am to provider Dr. Amada Jupiter via secure text paging. Electronically Signed   By: Orvan Falconer M.D.   On: 02/07/2023 10:47     PHYSICAL EXAM  Temp:  [97.6 F (36.4 C)-98.4 F (36.9 C)] 98.2 F (36.8 C) (10/29 1052) Pulse Rate:  [76-84] 81 (10/29 1052) Resp:  [15-20] 18 (10/29 1052) BP: (113-145)/(72-89) 126/72 (10/29 1052) SpO2:  [97 %-100 %] 98 % (10/29 1052)  General - Well nourished, well developed, in no apparent distress. Cardiovascular - Regular rhythm and rate.  Neuro - awake, alert sitting up in the chair. Pt eyes open, global aphasia, not following commands, able to have sounds out but not meaningful. Left gaze preference, does cross midline, tracks,  right hemianopia vs. Visual neglect, no blink to threat on the right, PERRL. Right facial droop. Tongue protrusion not cooperative. LUE at least 3+/5 and LLE at least 3/5, RUE spontaneous, purposeful movement against gravity, RLE withdraws  ASSESSMENT/PLAN Kristen Parks is a 82 y.o. female with history of hypertension, hyperlipidemia, stroke in 2019 admitted for slurred speech, aphasia, left gaze and right-sided weakness.  No tPA given due to outside window.    Stroke:  left MCA and ACA infarcts s/p IR with TICI3 on L A4 and TICI2c on L M3 complicated by extravasation at left M3 s/p coil embolization, embolic pattern, likely secondary to cardioembolic source CT head no acute infarct, questionable left frontal early sign of stroke CT head and neck left M2 high-grade stenosis versus near occlusion CTP 12/72 Status post IR with left A4 occlusion with TICI3 reperfusion, left M3 occlusion with TICI2c complicated by extravasation status post coil embolization MRI left MCA and ACA infarcts with hemorrhagic transformation and questionable SAH CT 12/26 left MCA and left distal ACA/MCA watershed area infarcts.  Small volume acute SAH, primary in the left sylvian fissure CT repeat 10/29 stable 2D  Echo EF 55 to 60% LE venous Doppler no DVT Will consider loop recorder if neuro significantly improved LDL 101 HgbA1c 5.2 Heparin subcu for VTE prophylaxis aspirin 81 mg daily prior to admission, now on ASA 81mg  Ongoing aggressive stroke risk factor management Therapy recommendations:  SNF Disposition: Pending  History of stroke 03/2018 admitted for small left CC and cingulate gyrus infarct left ACA territory.  MRA head negative.  CTA neck on markable.  LDL 39, A1c 5.1.  EF 60 to 60%.  Had anemia with PRBC transfusion.  Discharged on aspirin and Zocor 20. Outpatient 30-day CardioNet monitor negative for A-fib.  Hypertension Stable BP goal less than 160 given SAH Long term BP goal normotensive  Hyperlipidemia Home meds: None LDL 101, goal < 70 Now on Crestor 20 Continue statin at discharge  Dysphagia Now on Dys 2 diet with thin liquid Nocturnal feeding - she is eating some food  FW 100 Q6h Encourage p.o. intake Hopefully taking off cortrak before SNF - dietitian consulted   Other Stroke Risk Factors Advanced age  Other Active Problems Hypophosphatemia Phos 2.1 replaced  Hospital day # 4  Patient seen and examined by NP/APP with MD. MD to update note as needed.   Elmer Picker, DNP, FNP-BC [facility] Pager: [phone]  ATTENDING NOTE: I reviewed above note and agree with the assessment and plan. Pt was seen and examined.   Daughter at bedside.  Patient reclining chair, still has right neglect, not blinking to visual threat on the right, left gaze preference, however right upper extremity and lower extremity muscle strength continue to improve.  PT now recommending SNF.  Patient not eating adequately, still on nocturnal tube feeding.  Dietitian consulted to help improve p.o. intake.  Repeat CT shows stable SAH, put on aspirin 81.  Continue statin.  For detailed assessment and plan, please refer to above/below as I have made changes wherever appropriate.    Marvel Plan, MD PhD Stroke Neurology 02/11/2023 5:26 PM

## 2023-02-11 NOTE — Care Management Important Message (Signed)
Important Message  Patient Details  Name: Kristen Parks MRN: [id number] Date of Birth: 07-24-1940   Important Message Given:  Yes - Medicare IM     [name] 02/11/2023, 2:19 PM

## 2023-02-12 DIAGNOSIS — I6340 Cerebral infarction due to embolism of unspecified cerebral artery: Secondary | ICD-10-CM | POA: Diagnosis not present

## 2023-02-12 LAB — BASIC METABOLIC PANEL
Anion gap: 5 (ref 5–15)
BUN: 22 mg/dL (ref 8–23)
CO2: 23 mmol/L (ref 22–32)
Calcium: 9.4 mg/dL (ref 8.9–10.3)
Chloride: 107 mmol/L (ref 98–111)
Creatinine, Ser: 0.7 mg/dL (ref 0.44–1.00)
GFR, Estimated: >60 mL/min (ref >60)
Glucose, Bld: 124 mg/dL — ABNORMAL HIGH (ref 70–99)
Potassium: 4.2 mmol/L (ref 3.5–5.1)
Sodium: 135 mmol/L (ref 135–145)

## 2023-02-12 LAB — CBC
HCT: 37.4 % (ref 36.0–46.0)
Hemoglobin: 12.2 g/dL (ref 12.0–15.0)
MCH: 30.7 pg (ref 26.0–34.0)
MCHC: 32.6 g/dL (ref 30.0–36.0)
MCV: 94.2 fL (ref 80.0–100.0)
Platelets: 249 10*3/uL (ref 150–400)
RBC: 3.97 MIL/uL (ref 3.87–5.11)
RDW: 12.5 % (ref 11.5–15.5)
WBC: 10.1 10*3/uL (ref 4.0–10.5)
nRBC: 0 % (ref 0.0–0.2)

## 2023-02-12 LAB — GLUCOSE, CAPILLARY
Glucose-Capillary: 112 mg/dL — ABNORMAL HIGH (ref 70–99)
Glucose-Capillary: 123 mg/dL — ABNORMAL HIGH (ref 70–99)
Glucose-Capillary: 134 mg/dL — ABNORMAL HIGH (ref 70–99)
Glucose-Capillary: 136 mg/dL — ABNORMAL HIGH (ref 70–99)
Glucose-Capillary: 136 mg/dL — ABNORMAL HIGH (ref 70–99)
Glucose-Capillary: 81 mg/dL (ref 70–99)

## 2023-02-12 MED ORDER — BOOST PLUS PO LIQD
237.0000 mL | Freq: Two times a day (BID) | ORAL | Status: DC
  Administered 2023-02-12 – 2023-02-14 (×5): 237 mL via ORAL
  Filled 2023-02-12 (×5): qty 237

## 2023-02-12 NOTE — Progress Notes (Signed)
Speech Language Pathology Treatment: Dysphagia;Cognitive-Linquistic  Patient Details Name: Kristen Parks MRN: [id number] DOB: 1940-10-16 Today's Date: 02/12/2023 Time: 9562-1308 SLP Time Calculation (min) (ACUTE ONLY): 17 min  Assessment / Plan / Recommendation Clinical Impression  Pt's daughter reports no concerns with current diet, although states pt has a limited appetite at baseline. Observed pt with trials of Dys 3 solids and thin liquids without overt s/s of dysphagia or aspiration. Recommend continuing diet of Dys 3 texture solids with thin liquids. Suspect pt is presenting at her baseline for swallowing and does not require further SLP f/u.   Per pt's family, pt has dementia at baseline which is likely impacting her cognitive linguistic presentation this date. Pt remains generally unintelligible and does not respond to cueing to use speech intelligibility strategies. She was resistant to answering yes/no questions regarding biographical and environmental information, although intermittently answered yes/no questions pertaining to her wants/needs. She did not follow commands even when given by family members. Suspect she presents with acute on chronic cognitive deficits, now characterized by both receptive and expressive difficulties. SLP will continue to follow to address cognitive linguistic goals as able.    HPI HPI: Pt is an 82 y.o. female who was brought into ED d/t  left-gaze preference, aphasia, dysarthria, right-sided weakness. CTH negative for hemorrhage, shows possible infarct posterior left subfrontal gyrus. CTA w/P shows: Left MCA M2 stenosis vs embolus with 12ml core infarct. Pt taken to IR for thrombectomy. Failed [facility] Swallow Screen 02/07/23. PMH significant for HTN, dyslipidemia, anemia, hypothyroidism, Left corpus collosum infarction 2019, chronic small vessel disease, dementia.      SLP Plan  Goals updated      Recommendations for follow up therapy are one component of a  multi-disciplinary discharge planning process, led by the attending physician.  Recommendations may be updated based on patient status, additional functional criteria and insurance authorization.    Recommendations  Diet recommendations: Dysphagia 3 (mechanical soft);Thin liquid Liquids provided via: Cup;Straw Medication Administration: Whole meds with liquid Supervision: Staff to assist with self feeding;Full supervision/cueing for compensatory strategies Compensations: Minimize environmental distractions;Slow rate;Small sips/bites;Follow solids with liquid Postural Changes and/or Swallow Maneuvers: Seated upright 90 degrees                  Oral care BID;Staff/trained caregiver to provide oral care   Frequent or constant Supervision/Assistance Cognitive communication deficit (R41.841);Aphasia (R47.01);Dysphagia, unspecified (R13.10)     Goals updated     Gwynneth Aliment, M.A., CF-SLP Speech Language Pathology, Acute Rehabilitation Services  Secure Chat preferred [phone]   02/12/2023, 10:10 AM

## 2023-02-12 NOTE — Progress Notes (Addendum)
OT Cancellation Note  Patient Details Name: Kristen Parks MRN: [id number] DOB: 06-23-1940   Cancelled Treatment:    Reason Eval/Treat Not Completed: Patient declined, no reason specified (pt resisting attempts at EOB/ROM, nonsensical speech at this time, but when OT attempted to assist pt in changing soiled gown, pt states "no". RN notified. Will follow up for OT tx as schedule permits)  Carver Fila, OTD, OTR/L SecureChat Preferred Acute Rehab [phone]   Roxy Mastandrea K Koonce 02/12/2023, 1:50 PM

## 2023-02-12 NOTE — Plan of Care (Signed)
  Problem: Education: Goal: Knowledge of secondary prevention will improve (MUST DOCUMENT ALL) Outcome: Not Progressing Goal: Knowledge of patient specific risk factors will improve Kristen Parks N/A or DELETE if not current risk factor) Outcome: Not Progressing   Problem: Coping: Goal: Will verbalize positive feelings about self Outcome: Not Progressing

## 2023-02-12 NOTE — Progress Notes (Signed)
Physical Therapy Treatment Patient Details Name: Kristen Parks MRN: [id number] DOB: 1940-08-01 Today's Date: 02/12/2023   History of Present Illness Kristen Parks is a 82 y.o. female with PMH significant for HTN, dyslipidemia, anemia, hypothyroidism, Left corpus collosum infarction 2019, chronic small vessel disease, dementia who was brought into ED d/t  left-gaze preference, aphasia, dysarthria, right-sided weakness. CTH negative for hemorrhage, shows possible infarct posterior left subfrontal gyrus. CTA w/P shows: Left MCA M2 stenosis vs embolus with 12ml core infarct. Patient taken to IR for thrombectomy.    PT Comments  Pt is progressing towards goals. Pt was able to increase gait distance this session but was unable to use AD due to what appears to be possible R sided neglect and unable to grasp the R side of the RW. Pt requires 2 person assist with Mod-Max A for gait with assist facilitating wgt shift for RLE forward progression. Pt requires mod A for sit to stand from EOB and for bed mobility. Pt continues to require heavy encouragement and multi modal cueing for movement initiation.  Due to pt current functional status, home set up and available assistance at home recommending skilled physical therapy services in order to decrease risk for falls, injury, decrease burden of care, decrease risk for immobility and re-hospitalization.     If plan is discharge home, recommend the following: A lot of help with walking and/or transfers;Assist for transportation;Help with stairs or ramp for entrance;Supervision due to cognitive status     Equipment Recommendations  Other (comment) (defer to post acute)       Precautions / Restrictions Precautions Precautions: Fall;Other (comment) Precaution Comments: cortrak Restrictions Weight Bearing Restrictions: No     Mobility  Bed Mobility Overal bed mobility: Needs Assistance Bed Mobility: Supine to Sit     Supine to sit: HOB elevated, Mod  assist     General bed mobility comments: heavy cueing for LE to EOB pt needs encouragement throughout to continue to progress movement.    Transfers Overall transfer level: Needs assistance Equipment used: Rolling walker (2 wheels) Transfers: Sit to/from Stand Sit to Stand: Min assist, Mod assist, +2 physical assistance, +2 safety/equipment           General transfer comment: heavy verbal cues for sequencing and initiation and hand placement on RW.    Ambulation/Gait Ambulation/Gait assistance: Mod assist, +2 physical assistance, +2 safety/equipment, Max assist Gait Distance (Feet): 60 Feet (with standing rest breaks) Assistive device: 2 person hand held assist Gait Pattern/deviations: Step-to pattern, Step-through pattern, Decreased step length - right, Drifts right/left       General Gait Details: Decreased mobility of the RLE. Mod A for balance, Max A intermittently for facilitation of Wgt shifting and progression of the RLE, multi modal cueing for scanning with R lean and drift during gait. initially attempted with RW but pt was unable to hold onto the R this session with R hand       Balance Overall balance assessment: Needs assistance Sitting-balance support: Feet supported, Bilateral upper extremity supported Sitting balance-Leahy Scale: Fair Sitting balance - Comments: sitting edge of recliner and EOB without LOB   Standing balance support: Bilateral upper extremity supported, During functional activity, Reliant on assistive device for balance Standing balance-Leahy Scale: Poor Standing balance comment: heavy reliance on external support        Cognition Arousal: Alert Behavior During Therapy: Flat affect Overall Cognitive Status: Difficult to assess       General Comments: Following simple commands <  25% of trials. Soft voice. Mumbling, nonsensical speech.           General Comments General comments (skin integrity, edema, etc.): Less L gaze  preference this session but continues to neglect the R side with heavy R lean and drift during gait. Daughter and grand daughter present throughout session assisting with encouragement.      Pertinent Vitals/Pain Pain Assessment Pain Assessment: Faces Faces Pain Scale: No hurt Breathing: normal Negative Vocalization: none Facial Expression: smiling or inexpressive Body Language: relaxed Consolability: no need to console PAINAD Score: 0     PT Goals (current goals can now be found in the care plan section) Acute Rehab PT Goals Patient Stated Goal: home per family PT Goal Formulation: With patient/family Time For Goal Achievement: 02/22/23 Potential to Achieve Goals: Good Progress towards PT goals: Progressing toward goals    Frequency    Min 1X/week      PT Plan  Continue with current POC       AM-PAC PT "6 Clicks" Mobility   Outcome Measure  Help needed turning from your back to your side while in a flat bed without using bedrails?: A Lot Help needed moving from lying on your back to sitting on the side of a flat bed without using bedrails?: A Lot Help needed moving to and from a bed to a chair (including a wheelchair)?: A Lot Help needed standing up from a chair using your arms (e.g., wheelchair or bedside chair)?: A Lot Help needed to walk in hospital room?: Total Help needed climbing 3-5 steps with a railing? : Total 6 Click Score: 10    End of Session Equipment Utilized During Treatment: Gait belt Activity Tolerance: Patient tolerated treatment well;Patient limited by fatigue Patient left: in chair;with call bell/phone within reach;with chair alarm set;with family/visitor present Nurse Communication: Mobility status PT Visit Diagnosis: Other abnormalities of gait and mobility (R26.89)     Time: 1020-1058 PT Time Calculation (min) (ACUTE ONLY): 38 min  Charges:    $Gait Training: 8-22 mins $Therapeutic Activity: 23-37 mins PT General Charges $$ ACUTE  PT VISIT: 1 Visit                    Harrel Carina, DPT, CLT  Acute Rehabilitation Services Office: [phone] (Secure chat preferred)   Kristen Parks 02/12/2023, 12:32 PM

## 2023-02-12 NOTE — Progress Notes (Signed)
STROKE TEAM PROGRESS NOTE   SUBJECTIVE (INTERVAL HISTORY) Pt lying in bed, no family around. No significant neuro changes. 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 Pending SNF. Still has cortrak and dietitian on board for calorie count.   OBJECTIVE Temp:  [97.8 F (36.6 C)-98.2 F (36.8 C)] 98.2 F (36.8 C) (10/30 1958) Pulse Rate:  [72-88] 87 (10/30 1958) Cardiac Rhythm: Normal sinus rhythm (10/30 1901) Resp:  [16-17] 16 (10/30 1958) BP: (108-124)/(61-74) 122/70 (10/30 1958) SpO2:  [97 %-98 %] 98 % (10/30 1958)  Recent Labs  Lab 02/12/23 0338 02/12/23 0927 02/12/23 1228 02/12/23 1605 02/12/23 2003  GLUCAP 136* 112* 123* 81 136*   Recent Labs  Lab 02/07/23 1821 02/08/23 0631 02/08/23 1744 02/09/23 0709 02/10/23 0840 02/11/23 0531 02/12/23 0646  NA  --  141  --  139 134* 139 135  K  --  4.2  --  3.5 3.9 4.1 4.2  CL  --  111  --  106 107 107 107  CO2  --  18*  --  22 18* 23 23  GLUCOSE  --  143*  --  112* 142* 117* 124*  BUN  --  17  --  15 18 21 22   CREATININE  --  0.73  --  0.59 0.74 0.64 0.70  CALCIUM  --  9.3  --  9.2 9.2 9.6 9.4  MG 1.8 1.8 2.0 1.7 1.9  --   --   PHOS 2.5 2.4* 1.7* 2.3* 2.1*  --   --    Recent Labs  Lab 02/07/23 1045  AST 20  ALT 11  ALKPHOS 180*  BILITOT 0.7  PROT 7.5  ALBUMIN 3.8   Recent Labs  Lab 02/07/23 1045 02/07/23 1051 02/08/23 0631 02/09/23 0709 02/10/23 0840 02/11/23 0531 02/12/23 0646  WBC 6.5  --  9.6 7.8 12.5* 10.8* 10.1  NEUTROABS 4.8  --   --   --   --   --   --   HGB 14.6   < > 12.4 12.7 13.7 12.4 12.2  HCT 44.2   < > 36.2 36.9 39.7 37.3 37.4  MCV 94.4  --  93.3 90.7 91.3 92.8 94.2  PLT 291  --  210 231 224 229 249   < > = values in this interval not displayed.        Component Value Date/Time   CHOL 157 02/08/2023 0630   TRIG 100 02/08/2023 0630   HDL 36 (L) 02/08/2023 0630   CHOLHDL 4.4 02/08/2023 0630   VLDL 20 02/08/2023 0630   LDLCALC 101 (H) 02/08/2023 0630   Lab  Results  Component Value Date   HGBA1C 5.2 02/07/2023     Recent Labs  Lab 02/07/23 1029  ETH <10    I have personally reviewed the radiological images below and agree with the radiology interpretations.  IR PERCUTANEOUS ART THROMBECTOMY/INFUSION INTRACRANIAL INC DIAG ANGIO  Result Date: 02/11/2023 INDICATION: KARLIA SCHLAEFER is an 82 year old female presenting with left-gaze preference, aphasia, dysarthria, right-sided weakness; NIHSS 19. Her last known well was 10 p.m. on 02/06/2023. Her past medical history significant for hypertension, dyslipidemia, anemia, hypothyroidism, Left corpus collosum infarction in 2019, chronic small vessel disease; baseline modified Rankin scale 3. Head CT showed no evidence of hemorrhage. On my personal review, hypodensity in the posterior left ACA and posterior left MCA territories was seen (MCA - ASPECTS 6). No IV thrombolytic was administered as patient was outside the window. CT angiogram of  the head and neck showed an occlusion of the distal left pericallosal artery (A4 segment) in the narrowing of the left M2/MCA posterior division branch, concerning for possible nonocclusive embolus versus stenosis. Findings discussed with patient's daughter including risks and potential benefits of intervention. Decision made to proceed with mechanical thrombectomy. EXAM: ULTRASOUND-GUIDED VASCULAR ACCESS DIAGNOSTIC CEREBRAL ANGIOGRAM MECHANICAL THROMBECTOMY ENDOVASCULAR VESSEL EMBOLIZATION FLAT PANEL HEAD CT COMPARISON:  CT/CT angiogram of the head and neck February 07, 2023. MEDICATIONS: Ancef 2 g IV. ANESTHESIA/SEDATION: The procedure was performed under general anesthesia. CONTRAST:  80 mL of Omnipaque 300 milligram/mL FLUOROSCOPY: Radiation Exposure Index (as provided by the fluoroscopic device): 982 mGy Kerma COMPLICATIONS: SIR LEVEL C - Requires therapy, minor hospitalization (<48 hrs). TECHNIQUE: Informed written consent was obtained from the patient's daughter after a  thorough discussion of the procedural risks, benefits and alternatives. All questions were addressed. Maximal Sterile Barrier Technique was utilized including caps, mask, sterile gowns, sterile gloves, sterile drape, hand hygiene and skin antiseptic. A timeout was performed prior to the initiation of the procedure. The right groin was prepped and draped in the usual sterile fashion. Using a micropuncture kit and the modified Seldinger technique, access was gained to the right common femoral artery and an 8 French sheath was placed. Real-time ultrasound guidance was utilized for vascular access including the acquisition of a permanent ultrasound image documenting patency of the accessed vessel. Under fluoroscopy, a Zoom 88 guide catheter was navigated over a 6 [place] VTK catheter and a 0.035" Terumo Glidewire into the aortic arch. The catheter was placed into the left common carotid artery. The VTK catheter was removed. Using biplane roadmap guidance, the a was advanced over the wire into the mid cervical segment of the left ICA. Frontal and lateral angiograms of the head were obtained. FINDINGS: 1. Mild atherosclerotic changes of the right common femoral artery without hemodynamically significant stenosis. 2. Increased tortuosity of the cervical segment of the left ICA. No significant atherosclerotic disease or stenosis at the left carotid bifurcation. Occlusion of the distal right pericallosal artery at the A4 segment. 3. Occlusion of a left M3/MCA posterior region branch. 4. Increased and early capillary blush in the left posterior MCA territory in the parietal region with early venous drainage, suggestive of ongoing ischemia. 5. No left M2/MCA occlusion or significant stenosis. PROCEDURE: Using biplane roadmap guidance, a Socrates 38 aspiration catheter was navigated over Colossus 35 guidewire into the left A1/ACA. The aspiration catheter was then advanced over the wire to the level of occlusion in the A4 segment  and connected to an aspiration pump. Continuous aspiration was performed for 2 minutes. The guide catheter was connected to a VacLok syringe. The aspiration catheter was subsequently removed under constant aspiration. The guide catheter was aspirated for debris. Left internal carotid artery angiograms with frontal and lateral views of the head showed recanalization of the left pericallosal artery with small residual, nonocclusive filling defect well maintained brisk anterograde flow. Persistent occlusion of the distal left M3/MCA segment was seen. Using biplane roadmap guidance, a Socrates 38 aspiration catheter was navigated over Colossus 35 guidewire into the left M2/MCA. The aspiration catheter was then advanced over the wire to the level of occlusion in the M3 segment and connected to an aspiration pump. Continuous aspiration was performed for 2 minutes. The guide catheter was connected to a VacLok syringe. The aspiration catheter was subsequently removed under constant aspiration. The guide catheter was aspirated for debris. Left internal carotid artery angiograms with frontal and lateral views  of the head showed recanalization of the M3 segment with slow distal flow. However, active contrast extravasation was noted at the M3 level. Using biplane roadmap guidance, a phenom 21 microcatheter was navigated over an Aristotle 14 micro guidewire into the left M2/MCA posterior division branch and then into the M3 segment, at the level of bursae contrast extravasation. Then, a detachable coils were placed at the level of contrast extravasation (2 mm x 6 cm target XL 360 soft x2). Control angiograms were obtained. Then, the microcatheter was prepped with dextrose 5%. Subsequently infusion of n-BCA was performed within the recently deployed coil mass. The microcatheter was then removed under constant aspiration. Left internal carotid artery angiograms with frontal and lateral views of the head showed distal occlusion of  the left M3/MCA posterior division branch with complete resolution of contrast extravasation. Flat panel CT of the head was obtained and post processed in a separate workstation with concurrent attending physician supervision. Selected images were sent to PACS. Moderate hyperdensity in the left sylvian fissure was noted, consistent with contrast extravasation. Delayed left internal carotid artery angiograms with frontal and lateral views of the head showed stable findings with persistent patency of the left pericallosal artery, occlusion of the distal left M3/MCA at the level of the coil mass with no evidence of contrast extravasation. The catheter was subsequently withdrawn. Right common femoral artery angiogram was obtained in right anterior oblique view. The puncture is at the level of the common femoral artery. The artery has normal caliber, adequate for closure device. The sheath was exchanged over the wire for a Perclose prostyle which was utilized for access closure. Immediate hemostasis was achieved. IMPRESSION: 1. Mechanical thrombectomy performed for treatment of a left pericallosal and left M3/MCA occlusion. 2. Recanalization of the left pericallosal artery achieved (TICI 3). 3. Recanalization of the left M3/MCA with slow flow, complicated by contrast extravasation requiring occlusion of the distal M3 branch with coils and n-BCA. Final left MCA patency status is a TICI 2B. PLAN: TRansfer to ICU. Electronically Signed   By: Baldemar Lenis M.D.   On: 02/11/2023 11:03   IR US Guide Vasc Access Right  Result Date: 02/11/2023 INDICATION: DANI FRAISE is an 82 year old female presenting with left-gaze preference, aphasia, dysarthria, right-sided weakness; NIHSS 19. Her last known well was 10 p.m. on 02/06/2023. Her past medical history significant for hypertension, dyslipidemia, anemia, hypothyroidism, Left corpus collosum infarction in 2019, chronic small vessel disease; baseline modified  Rankin scale 3. Head CT showed no evidence of hemorrhage. On my personal review, hypodensity in the posterior left ACA and posterior left MCA territories was seen (MCA - ASPECTS 6). No IV thrombolytic was administered as patient was outside the window. CT angiogram of the head and neck showed an occlusion of the distal left pericallosal artery (A4 segment) in the narrowing of the left M2/MCA posterior division branch, concerning for possible nonocclusive embolus versus stenosis. Findings discussed with patient's daughter including risks and potential benefits of intervention. Decision made to proceed with mechanical thrombectomy. EXAM: ULTRASOUND-GUIDED VASCULAR ACCESS DIAGNOSTIC CEREBRAL ANGIOGRAM MECHANICAL THROMBECTOMY ENDOVASCULAR VESSEL EMBOLIZATION FLAT PANEL HEAD CT COMPARISON:  CT/CT angiogram of the head and neck February 07, 2023. MEDICATIONS: Ancef 2 g IV. ANESTHESIA/SEDATION: The procedure was performed under general anesthesia. CONTRAST:  80 mL of Omnipaque 300 milligram/mL FLUOROSCOPY: Radiation Exposure Index (as provided by the fluoroscopic device): 982 mGy Kerma COMPLICATIONS: SIR LEVEL C - Requires therapy, minor hospitalization (<48 hrs). TECHNIQUE: Informed written consent was obtained from  the patient's daughter after a thorough discussion of the procedural risks, benefits and alternatives. All questions were addressed. Maximal Sterile Barrier Technique was utilized including caps, mask, sterile gowns, sterile gloves, sterile drape, hand hygiene and skin antiseptic. A timeout was performed prior to the initiation of the procedure. The right groin was prepped and draped in the usual sterile fashion. Using a micropuncture kit and the modified Seldinger technique, access was gained to the right common femoral artery and an 8 French sheath was placed. Real-time ultrasound guidance was utilized for vascular access including the acquisition of a permanent ultrasound image documenting patency of the  accessed vessel. Under fluoroscopy, a Zoom 88 guide catheter was navigated over a 6 [place] VTK catheter and a 0.035" Terumo Glidewire into the aortic arch. The catheter was placed into the left common carotid artery. The VTK catheter was removed. Using biplane roadmap guidance, the a was advanced over the wire into the mid cervical segment of the left ICA. Frontal and lateral angiograms of the head were obtained. FINDINGS: 1. Mild atherosclerotic changes of the right common femoral artery without hemodynamically significant stenosis. 2. Increased tortuosity of the cervical segment of the left ICA. No significant atherosclerotic disease or stenosis at the left carotid bifurcation. Occlusion of the distal right pericallosal artery at the A4 segment. 3. Occlusion of a left M3/MCA posterior region branch. 4. Increased and early capillary blush in the left posterior MCA territory in the parietal region with early venous drainage, suggestive of ongoing ischemia. 5. No left M2/MCA occlusion or significant stenosis. PROCEDURE: Using biplane roadmap guidance, a Socrates 38 aspiration catheter was navigated over Colossus 35 guidewire into the left A1/ACA. The aspiration catheter was then advanced over the wire to the level of occlusion in the A4 segment and connected to an aspiration pump. Continuous aspiration was performed for 2 minutes. The guide catheter was connected to a VacLok syringe. The aspiration catheter was subsequently removed under constant aspiration. The guide catheter was aspirated for debris. Left internal carotid artery angiograms with frontal and lateral views of the head showed recanalization of the left pericallosal artery with small residual, nonocclusive filling defect well maintained brisk anterograde flow. Persistent occlusion of the distal left M3/MCA segment was seen. Using biplane roadmap guidance, a Socrates 38 aspiration catheter was navigated over Colossus 35 guidewire into the left M2/MCA.  The aspiration catheter was then advanced over the wire to the level of occlusion in the M3 segment and connected to an aspiration pump. Continuous aspiration was performed for 2 minutes. The guide catheter was connected to a VacLok syringe. The aspiration catheter was subsequently removed under constant aspiration. The guide catheter was aspirated for debris. Left internal carotid artery angiograms with frontal and lateral views of the head showed recanalization of the M3 segment with slow distal flow. However, active contrast extravasation was noted at the M3 level. Using biplane roadmap guidance, a phenom 21 microcatheter was navigated over an Aristotle 14 micro guidewire into the left M2/MCA posterior division branch and then into the M3 segment, at the level of bursae contrast extravasation. Then, a detachable coils were placed at the level of contrast extravasation (2 mm x 6 cm target XL 360 soft x2). Control angiograms were obtained. Then, the microcatheter was prepped with dextrose 5%. Subsequently infusion of n-BCA was performed within the recently deployed coil mass. The microcatheter was then removed under constant aspiration. Left internal carotid artery angiograms with frontal and lateral views of the head showed distal occlusion of the  left M3/MCA posterior division branch with complete resolution of contrast extravasation. Flat panel CT of the head was obtained and post processed in a separate workstation with concurrent attending physician supervision. Selected images were sent to PACS. Moderate hyperdensity in the left sylvian fissure was noted, consistent with contrast extravasation. Delayed left internal carotid artery angiograms with frontal and lateral views of the head showed stable findings with persistent patency of the left pericallosal artery, occlusion of the distal left M3/MCA at the level of the coil mass with no evidence of contrast extravasation. The catheter was subsequently  withdrawn. Right common femoral artery angiogram was obtained in right anterior oblique view. The puncture is at the level of the common femoral artery. The artery has normal caliber, adequate for closure device. The sheath was exchanged over the wire for a Perclose prostyle which was utilized for access closure. Immediate hemostasis was achieved. IMPRESSION: 1. Mechanical thrombectomy performed for treatment of a left pericallosal and left M3/MCA occlusion. 2. Recanalization of the left pericallosal artery achieved (TICI 3). 3. Recanalization of the left M3/MCA with slow flow, complicated by contrast extravasation requiring occlusion of the distal M3 branch with coils and n-BCA. Final left MCA patency status is a TICI 2B. PLAN: TRansfer to ICU. Electronically Signed   By: Baldemar Lenis M.D.   On: 02/11/2023 11:03   IR CT Head Ltd  Result Date: 02/11/2023 INDICATION: EMMALINE SILCOTT is an 82 year old female presenting with left-gaze preference, aphasia, dysarthria, right-sided weakness; NIHSS 19. Her last known well was 10 p.m. on 02/06/2023. Her past medical history significant for hypertension, dyslipidemia, anemia, hypothyroidism, Left corpus collosum infarction in 2019, chronic small vessel disease; baseline modified Rankin scale 3. Head CT showed no evidence of hemorrhage. On my personal review, hypodensity in the posterior left ACA and posterior left MCA territories was seen (MCA - ASPECTS 6). No IV thrombolytic was administered as patient was outside the window. CT angiogram of the head and neck showed an occlusion of the distal left pericallosal artery (A4 segment) in the narrowing of the left M2/MCA posterior division branch, concerning for possible nonocclusive embolus versus stenosis. Findings discussed with patient's daughter including risks and potential benefits of intervention. Decision made to proceed with mechanical thrombectomy. EXAM: ULTRASOUND-GUIDED VASCULAR ACCESS DIAGNOSTIC  CEREBRAL ANGIOGRAM MECHANICAL THROMBECTOMY ENDOVASCULAR VESSEL EMBOLIZATION FLAT PANEL HEAD CT COMPARISON:  CT/CT angiogram of the head and neck February 07, 2023. MEDICATIONS: Ancef 2 g IV. ANESTHESIA/SEDATION: The procedure was performed under general anesthesia. CONTRAST:  80 mL of Omnipaque 300 milligram/mL FLUOROSCOPY: Radiation Exposure Index (as provided by the fluoroscopic device): 982 mGy Kerma COMPLICATIONS: SIR LEVEL C - Requires therapy, minor hospitalization (<48 hrs). TECHNIQUE: Informed written consent was obtained from the patient's daughter after a thorough discussion of the procedural risks, benefits and alternatives. All questions were addressed. Maximal Sterile Barrier Technique was utilized including caps, mask, sterile gowns, sterile gloves, sterile drape, hand hygiene and skin antiseptic. A timeout was performed prior to the initiation of the procedure. The right groin was prepped and draped in the usual sterile fashion. Using a micropuncture kit and the modified Seldinger technique, access was gained to the right common femoral artery and an 8 French sheath was placed. Real-time ultrasound guidance was utilized for vascular access including the acquisition of a permanent ultrasound image documenting patency of the accessed vessel. Under fluoroscopy, a Zoom 88 guide catheter was navigated over a 6 [place] VTK catheter and a 0.035" Terumo Glidewire into the aortic arch. The  catheter was placed into the left common carotid artery. The VTK catheter was removed. Using biplane roadmap guidance, the a was advanced over the wire into the mid cervical segment of the left ICA. Frontal and lateral angiograms of the head were obtained. FINDINGS: 1. Mild atherosclerotic changes of the right common femoral artery without hemodynamically significant stenosis. 2. Increased tortuosity of the cervical segment of the left ICA. No significant atherosclerotic disease or stenosis at the left carotid bifurcation.  Occlusion of the distal right pericallosal artery at the A4 segment. 3. Occlusion of a left M3/MCA posterior region branch. 4. Increased and early capillary blush in the left posterior MCA territory in the parietal region with early venous drainage, suggestive of ongoing ischemia. 5. No left M2/MCA occlusion or significant stenosis. PROCEDURE: Using biplane roadmap guidance, a Socrates 38 aspiration catheter was navigated over Colossus 35 guidewire into the left A1/ACA. The aspiration catheter was then advanced over the wire to the level of occlusion in the A4 segment and connected to an aspiration pump. Continuous aspiration was performed for 2 minutes. The guide catheter was connected to a VacLok syringe. The aspiration catheter was subsequently removed under constant aspiration. The guide catheter was aspirated for debris. Left internal carotid artery angiograms with frontal and lateral views of the head showed recanalization of the left pericallosal artery with small residual, nonocclusive filling defect well maintained brisk anterograde flow. Persistent occlusion of the distal left M3/MCA segment was seen. Using biplane roadmap guidance, a Socrates 38 aspiration catheter was navigated over Colossus 35 guidewire into the left M2/MCA. The aspiration catheter was then advanced over the wire to the level of occlusion in the M3 segment and connected to an aspiration pump. Continuous aspiration was performed for 2 minutes. The guide catheter was connected to a VacLok syringe. The aspiration catheter was subsequently removed under constant aspiration. The guide catheter was aspirated for debris. Left internal carotid artery angiograms with frontal and lateral views of the head showed recanalization of the M3 segment with slow distal flow. However, active contrast extravasation was noted at the M3 level. Using biplane roadmap guidance, a phenom 21 microcatheter was navigated over an Aristotle 14 micro guidewire into  the left M2/MCA posterior division branch and then into the M3 segment, at the level of bursae contrast extravasation. Then, a detachable coils were placed at the level of contrast extravasation (2 mm x 6 cm target XL 360 soft x2). Control angiograms were obtained. Then, the microcatheter was prepped with dextrose 5%. Subsequently infusion of n-BCA was performed within the recently deployed coil mass. The microcatheter was then removed under constant aspiration. Left internal carotid artery angiograms with frontal and lateral views of the head showed distal occlusion of the left M3/MCA posterior division branch with complete resolution of contrast extravasation. Flat panel CT of the head was obtained and post processed in a separate workstation with concurrent attending physician supervision. Selected images were sent to PACS. Moderate hyperdensity in the left sylvian fissure was noted, consistent with contrast extravasation. Delayed left internal carotid artery angiograms with frontal and lateral views of the head showed stable findings with persistent patency of the left pericallosal artery, occlusion of the distal left M3/MCA at the level of the coil mass with no evidence of contrast extravasation. The catheter was subsequently withdrawn. Right common femoral artery angiogram was obtained in right anterior oblique view. The puncture is at the level of the common femoral artery. The artery has normal caliber, adequate for closure device. The sheath  was exchanged over the wire for a Perclose prostyle which was utilized for access closure. Immediate hemostasis was achieved. IMPRESSION: 1. Mechanical thrombectomy performed for treatment of a left pericallosal and left M3/MCA occlusion. 2. Recanalization of the left pericallosal artery achieved (TICI 3). 3. Recanalization of the left M3/MCA with slow flow, complicated by contrast extravasation requiring occlusion of the distal M3 branch with coils and n-BCA. Final  left MCA patency status is a TICI 2B. PLAN: TRansfer to ICU. Electronically Signed   By: Baldemar Lenis M.D.   On: 02/11/2023 11:03   IR Transcath/Emboliz  Result Date: 02/11/2023 INDICATION: BURLEY BARTOLUCCI is an 82 year old female presenting with left-gaze preference, aphasia, dysarthria, right-sided weakness; NIHSS 19. Her last known well was 10 p.m. on 02/06/2023. Her past medical history significant for hypertension, dyslipidemia, anemia, hypothyroidism, Left corpus collosum infarction in 2019, chronic small vessel disease; baseline modified Rankin scale 3. Head CT showed no evidence of hemorrhage. On my personal review, hypodensity in the posterior left ACA and posterior left MCA territories was seen (MCA - ASPECTS 6). No IV thrombolytic was administered as patient was outside the window. CT angiogram of the head and neck showed an occlusion of the distal left pericallosal artery (A4 segment) in the narrowing of the left M2/MCA posterior division branch, concerning for possible nonocclusive embolus versus stenosis. Findings discussed with patient's daughter including risks and potential benefits of intervention. Decision made to proceed with mechanical thrombectomy. EXAM: ULTRASOUND-GUIDED VASCULAR ACCESS DIAGNOSTIC CEREBRAL ANGIOGRAM MECHANICAL THROMBECTOMY ENDOVASCULAR VESSEL EMBOLIZATION FLAT PANEL HEAD CT COMPARISON:  CT/CT angiogram of the head and neck February 07, 2023. MEDICATIONS: Ancef 2 g IV. ANESTHESIA/SEDATION: The procedure was performed under general anesthesia. CONTRAST:  80 mL of Omnipaque 300 milligram/mL FLUOROSCOPY: Radiation Exposure Index (as provided by the fluoroscopic device): 982 mGy Kerma COMPLICATIONS: SIR LEVEL C - Requires therapy, minor hospitalization (<48 hrs). TECHNIQUE: Informed written consent was obtained from the patient's daughter after a thorough discussion of the procedural risks, benefits and alternatives. All questions were addressed. Maximal Sterile  Barrier Technique was utilized including caps, mask, sterile gowns, sterile gloves, sterile drape, hand hygiene and skin antiseptic. A timeout was performed prior to the initiation of the procedure. The right groin was prepped and draped in the usual sterile fashion. Using a micropuncture kit and the modified Seldinger technique, access was gained to the right common femoral artery and an 8 French sheath was placed. Real-time ultrasound guidance was utilized for vascular access including the acquisition of a permanent ultrasound image documenting patency of the accessed vessel. Under fluoroscopy, a Zoom 88 guide catheter was navigated over a 6 [place] VTK catheter and a 0.035" Terumo Glidewire into the aortic arch. The catheter was placed into the left common carotid artery. The VTK catheter was removed. Using biplane roadmap guidance, the a was advanced over the wire into the mid cervical segment of the left ICA. Frontal and lateral angiograms of the head were obtained. FINDINGS: 1. Mild atherosclerotic changes of the right common femoral artery without hemodynamically significant stenosis. 2. Increased tortuosity of the cervical segment of the left ICA. No significant atherosclerotic disease or stenosis at the left carotid bifurcation. Occlusion of the distal right pericallosal artery at the A4 segment. 3. Occlusion of a left M3/MCA posterior region branch. 4. Increased and early capillary blush in the left posterior MCA territory in the parietal region with early venous drainage, suggestive of ongoing ischemia. 5. No left M2/MCA occlusion or significant stenosis. PROCEDURE: Using biplane  roadmap guidance, a Socrates 38 aspiration catheter was navigated over Colossus 35 guidewire into the left A1/ACA. The aspiration catheter was then advanced over the wire to the level of occlusion in the A4 segment and connected to an aspiration pump. Continuous aspiration was performed for 2 minutes. The guide catheter was  connected to a VacLok syringe. The aspiration catheter was subsequently removed under constant aspiration. The guide catheter was aspirated for debris. Left internal carotid artery angiograms with frontal and lateral views of the head showed recanalization of the left pericallosal artery with small residual, nonocclusive filling defect well maintained brisk anterograde flow. Persistent occlusion of the distal left M3/MCA segment was seen. Using biplane roadmap guidance, a Socrates 38 aspiration catheter was navigated over Colossus 35 guidewire into the left M2/MCA. The aspiration catheter was then advanced over the wire to the level of occlusion in the M3 segment and connected to an aspiration pump. Continuous aspiration was performed for 2 minutes. The guide catheter was connected to a VacLok syringe. The aspiration catheter was subsequently removed under constant aspiration. The guide catheter was aspirated for debris. Left internal carotid artery angiograms with frontal and lateral views of the head showed recanalization of the M3 segment with slow distal flow. However, active contrast extravasation was noted at the M3 level. Using biplane roadmap guidance, a phenom 21 microcatheter was navigated over an Aristotle 14 micro guidewire into the left M2/MCA posterior division branch and then into the M3 segment, at the level of bursae contrast extravasation. Then, a detachable coils were placed at the level of contrast extravasation (2 mm x 6 cm target XL 360 soft x2). Control angiograms were obtained. Then, the microcatheter was prepped with dextrose 5%. Subsequently infusion of n-BCA was performed within the recently deployed coil mass. The microcatheter was then removed under constant aspiration. Left internal carotid artery angiograms with frontal and lateral views of the head showed distal occlusion of the left M3/MCA posterior division branch with complete resolution of contrast extravasation. Flat panel CT of  the head was obtained and post processed in a separate workstation with concurrent attending physician supervision. Selected images were sent to PACS. Moderate hyperdensity in the left sylvian fissure was noted, consistent with contrast extravasation. Delayed left internal carotid artery angiograms with frontal and lateral views of the head showed stable findings with persistent patency of the left pericallosal artery, occlusion of the distal left M3/MCA at the level of the coil mass with no evidence of contrast extravasation. The catheter was subsequently withdrawn. Right common femoral artery angiogram was obtained in right anterior oblique view. The puncture is at the level of the common femoral artery. The artery has normal caliber, adequate for closure device. The sheath was exchanged over the wire for a Perclose prostyle which was utilized for access closure. Immediate hemostasis was achieved. IMPRESSION: 1. Mechanical thrombectomy performed for treatment of a left pericallosal and left M3/MCA occlusion. 2. Recanalization of the left pericallosal artery achieved (TICI 3). 3. Recanalization of the left M3/MCA with slow flow, complicated by contrast extravasation requiring occlusion of the distal M3 branch with coils and n-BCA. Final left MCA patency status is a TICI 2B. PLAN: TRansfer to ICU. Electronically Signed   By: Baldemar Lenis M.D.   On: 02/11/2023 11:03   CT HEAD WO CONTRAST ( )  Result Date: 02/11/2023 INDICATION: CHAUNTEL GORSUCH is an 82 year old female presenting with left-gaze preference, aphasia, dysarthria, right-sided weakness; NIHSS 19. Her last known well was 10 p.m. on 02/06/2023.  Her past medical history significant for hypertension, dyslipidemia, anemia, hypothyroidism, Left corpus collosum infarction in 2019, chronic small vessel disease; baseline modified Rankin scale 3. Head CT showed no evidence of hemorrhage. On my personal review, hypodensity in the posterior left  ACA and posterior left MCA territories was seen (MCA - ASPECTS 6). No IV thrombolytic was administered as patient was outside the window. CT angiogram of the head and neck showed an occlusion of the distal left pericallosal artery (A4 segment) in the narrowing of the left M2/MCA posterior division branch, concerning for possible nonocclusive embolus versus stenosis. Findings discussed with patient's daughter including risks and potential benefits of intervention. Decision made to proceed with mechanical thrombectomy. EXAM: ULTRASOUND-GUIDED VASCULAR ACCESS DIAGNOSTIC CEREBRAL ANGIOGRAM MECHANICAL THROMBECTOMY ENDOVASCULAR VESSEL EMBOLIZATION FLAT PANEL HEAD CT COMPARISON:  CT/CT angiogram of the head and neck February 07, 2023. MEDICATIONS: Ancef 2 g IV. ANESTHESIA/SEDATION: The procedure was performed under general anesthesia. CONTRAST:  80 mL of Omnipaque 300 milligram/mL FLUOROSCOPY: Radiation Exposure Index (as provided by the fluoroscopic device): 982 mGy Kerma COMPLICATIONS: SIR LEVEL C - Requires therapy, minor hospitalization (<48 hrs). TECHNIQUE: Informed written consent was obtained from the patient's daughter after a thorough discussion of the procedural risks, benefits and alternatives. All questions were addressed. Maximal Sterile Barrier Technique was utilized including caps, mask, sterile gowns, sterile gloves, sterile drape, hand hygiene and skin antiseptic. A timeout was performed prior to the initiation of the procedure. The right groin was prepped and draped in the usual sterile fashion. Using a micropuncture kit and the modified Seldinger technique, access was gained to the right common femoral artery and an 8 French sheath was placed. Real-time ultrasound guidance was utilized for vascular access including the acquisition of a permanent ultrasound image documenting patency of the accessed vessel. Under fluoroscopy, a Zoom 88 guide catheter was navigated over a 6 [place] VTK catheter and a 0.035"  Terumo Glidewire into the aortic arch. The catheter was placed into the left common carotid artery. The VTK catheter was removed. Using biplane roadmap guidance, the a was advanced over the wire into the mid cervical segment of the left ICA. Frontal and lateral angiograms of the head were obtained. FINDINGS: 1. Mild atherosclerotic changes of the right common femoral artery without hemodynamically significant stenosis. 2. Increased tortuosity of the cervical segment of the left ICA. No significant atherosclerotic disease or stenosis at the left carotid bifurcation. Occlusion of the distal right pericallosal artery at the A4 segment. 3. Occlusion of a left M3/MCA posterior region branch. 4. Increased and early capillary blush in the left posterior MCA territory in the parietal region with early venous drainage, suggestive of ongoing ischemia. 5. No left M2/MCA occlusion or significant stenosis. PROCEDURE: Using biplane roadmap guidance, a Socrates 38 aspiration catheter was navigated over Colossus 35 guidewire into the left A1/ACA. The aspiration catheter was then advanced over the wire to the level of occlusion in the A4 segment and connected to an aspiration pump. Continuous aspiration was performed for 2 minutes. The guide catheter was connected to a VacLok syringe. The aspiration catheter was subsequently removed under constant aspiration. The guide catheter was aspirated for debris. Left internal carotid artery angiograms with frontal and lateral views of the head showed recanalization of the left pericallosal artery with small residual, nonocclusive filling defect well maintained brisk anterograde flow. Persistent occlusion of the distal left M3/MCA segment was seen. Using biplane roadmap guidance, a Socrates 38 aspiration catheter was navigated over Colossus 35 guidewire into the left  M2/MCA. The aspiration catheter was then advanced over the wire to the level of occlusion in the M3 segment and connected to an  aspiration pump. Continuous aspiration was performed for 2 minutes. The guide catheter was connected to a VacLok syringe. The aspiration catheter was subsequently removed under constant aspiration. The guide catheter was aspirated for debris. Left internal carotid artery angiograms with frontal and lateral views of the head showed recanalization of the M3 segment with slow distal flow. However, active contrast extravasation was noted at the M3 level. Using biplane roadmap guidance, a phenom 21 microcatheter was navigated over an Aristotle 14 micro guidewire into the left M2/MCA posterior division branch and then into the M3 segment, at the level of bursae contrast extravasation. Then, a detachable coils were placed at the level of contrast extravasation (2 mm x 6 cm target XL 360 soft x2). Control angiograms were obtained. Then, the microcatheter was prepped with dextrose 5%. Subsequently infusion of n-BCA was performed within the recently deployed coil mass. The microcatheter was then removed under constant aspiration. Left internal carotid artery angiograms with frontal and lateral views of the head showed distal occlusion of the left M3/MCA posterior division branch with complete resolution of contrast extravasation. Flat panel CT of the head was obtained and post processed in a separate workstation with concurrent attending physician supervision. Selected images were sent to PACS. Moderate hyperdensity in the left sylvian fissure was noted, consistent with contrast extravasation. Delayed left internal carotid artery angiograms with frontal and lateral views of the head showed stable findings with persistent patency of the left pericallosal artery, occlusion of the distal left M3/MCA at the level of the coil mass with no evidence of contrast extravasation. The catheter was subsequently withdrawn. Right common femoral artery angiogram was obtained in right anterior oblique view. The puncture is at the level of the  common femoral artery. The artery has normal caliber, adequate for closure device. The sheath was exchanged over the wire for a Perclose prostyle which was utilized for access closure. Immediate hemostasis was achieved. IMPRESSION: 1. Mechanical thrombectomy performed for treatment of a left pericallosal and left M3/MCA occlusion. 2. Recanalization of the left pericallosal artery achieved (TICI 3). 3. Recanalization of the left M3/MCA with slow flow, complicated by contrast extravasation requiring occlusion of the distal M3 branch with coils and n-BCA. Final left MCA patency status is a TICI 2B. PLAN: TRansfer to ICU. Electronically Signed   By: Baldemar Lenis M.D.   On: 02/11/2023 11:03   VAS [place] LOWER EXTREMITY VENOUS (DVT)  Result Date: 02/10/2023  Lower Venous DVT Study Patient Name:  LANIYA MAZAK  Date of Exam:   02/09/2023 Medical Rec #: [id number]       Accession #:    [id number] Date of Birth: [birth year]        Patient Gender: F Patient Age:   49 years Exam Location:  [facility] Procedure:      VAS [place] LOWER EXTREMITY VENOUS (DVT) Referring Phys: Scheryl Marten Atlantis Delong --------------------------------------------------------------------------------  Indications: Stroke.  Limitations: Bandages, altered mental status, poor hemodynamically positioning. Comparison Study: No prior study on file Performing Technologist: Sherren Kerns RVS  Examination Guidelines: A complete evaluation includes B-mode imaging, spectral Doppler, color Doppler, and power Doppler as needed of all accessible portions of each vessel. Bilateral testing is considered an integral part of a complete examination. Limited examinations for reoccurring indications may be performed as noted. The reflux portion of the exam is performed with the patient  in reverse Trendelenburg.  +---------+---------------+---------+-----------+----------+-------------------+ RIGHT    CompressibilityPhasicitySpontaneityPropertiesThrombus Aging       +---------+---------------+---------+-----------+----------+-------------------+ CFV      Full           Yes      Yes                                      +---------+---------------+---------+-----------+----------+-------------------+ SFJ      Full                                                             +---------+---------------+---------+-----------+----------+-------------------+ FV Prox  Full                                                             +---------+---------------+---------+-----------+----------+-------------------+ FV Mid   Full                                                             +---------+---------------+---------+-----------+----------+-------------------+ FV DistalFull                                                             +---------+---------------+---------+-----------+----------+-------------------+ PFV      Full                                                             +---------+---------------+---------+-----------+----------+-------------------+ POP      Full           Yes      No                                       +---------+---------------+---------+-----------+----------+-------------------+ PTV      Full                                                             +---------+---------------+---------+-----------+----------+-------------------+ PERO                                                  Not well visualized +---------+---------------+---------+-----------+----------+-------------------+   +---------+---------------+---------+-----------+----------+-------------------+ LEFT  CompressibilityPhasicitySpontaneityPropertiesThrombus Aging      +---------+---------------+---------+-----------+----------+-------------------+ CFV      Full           Yes      Yes                                       +---------+---------------+---------+-----------+----------+-------------------+ SFJ      Full                                                             +---------+---------------+---------+-----------+----------+-------------------+ FV Prox  Full                                                             +---------+---------------+---------+-----------+----------+-------------------+ FV Mid   Full                                                             +---------+---------------+---------+-----------+----------+-------------------+ FV DistalFull                                                             +---------+---------------+---------+-----------+----------+-------------------+ PFV      Full                                                             +---------+---------------+---------+-----------+----------+-------------------+ POP                     No       Yes                  patent by color and                                                       Doppler             +---------+---------------+---------+-----------+----------+-------------------+ PTV      Full                                                             +---------+---------------+---------+-----------+----------+-------------------+ PERO  Not well visualized +---------+---------------+---------+-----------+----------+-------------------+     *See table(s) above for measurements and observations. Electronically signed by Sherald Hess MD on 02/10/2023 at 11:13:31 AM.    Final    ECHOCARDIOGRAM COMPLETE  Result Date: 02/08/2023    ECHOCARDIOGRAM REPORT   Patient Name:   ANADELIA MAXHAM Date of Exam: 02/08/2023 Medical Rec #:  [id number]      Height:       62.0 in Accession #:    [id number]     Weight:       109.8 lb Date of Birth:  1940-06-03       BSA:          1.482 m Patient Age:    82 years       BP:            132/77 mmHg Patient Gender: F              HR:           76 bpm. Exam Location:  Inpatient Procedure: 2D Echo, Cardiac Doppler and Color Doppler Indications:    Stroke 434.91/I163.9  History:        Patient has prior history of Echocardiogram examinations, most                 recent 04/15/2018. Stroke; Risk Factors:Hypertension and                 Dyslipidemia. Migraine.  Sonographer:    Lucendia Herrlich RCS Referring Phys: Amada Jupiter, MCNEILL, P  Sonographer Comments: Image acquisition challenging due to patient body habitus. IMPRESSIONS  1. Left ventricular ejection fraction, by estimation, is 55 to 60%. The left ventricle has normal function. The left ventricle has no regional wall motion abnormalities. Left ventricular diastolic parameters are consistent with Grade I diastolic dysfunction (impaired relaxation).  2. Right ventricular systolic function is normal. The right ventricular size is normal. Tricuspid regurgitation signal is inadequate for assessing PA pressure. The estimated right ventricular systolic pressure is 18.5 mmHg.  3. The mitral valve is grossly normal. No evidence of mitral valve regurgitation. No evidence of mitral stenosis.  4. The aortic valve is tricuspid. Aortic valve regurgitation is not visualized. No aortic stenosis is present.  5. The inferior vena cava is normal in size with greater than 50% respiratory variability, suggesting right atrial pressure of 3 mmHg. Comparison(s): No significant change from prior study. FINDINGS  Left Ventricle: Left ventricular ejection fraction, by estimation, is 55 to 60%. The left ventricle has normal function. The left ventricle has no regional wall motion abnormalities. The left ventricular internal cavity size was normal in size. There is  no left ventricular hypertrophy. Left ventricular diastolic function could not be evaluated due to nondiagnostic images. Left ventricular diastolic parameters are consistent with Grade I diastolic dysfunction  (impaired relaxation). Right Ventricle: The right ventricular size is normal. No increase in right ventricular wall thickness. Right ventricular systolic function is normal. Tricuspid regurgitation signal is inadequate for assessing PA pressure. The tricuspid regurgitant velocity is 1.97 m/s, and with an assumed right atrial pressure of 3 mmHg, the estimated right ventricular systolic pressure is 18.5 mmHg. Left Atrium: Left atrial size was normal in size. Right Atrium: Right atrial size was normal in size. Pericardium: There is no evidence of pericardial effusion. Mitral Valve: The mitral valve is grossly normal. No evidence of mitral valve regurgitation. No evidence of mitral valve stenosis. Tricuspid Valve: The tricuspid valve is grossly normal. Tricuspid valve regurgitation is trivial.  No evidence of tricuspid stenosis. Aortic Valve: The aortic valve is tricuspid. Aortic valve regurgitation is not visualized. No aortic stenosis is present. Aortic valve peak gradient measures 6.1 mmHg. Pulmonic Valve: The pulmonic valve was grossly normal. Pulmonic valve regurgitation is not visualized. No evidence of pulmonic stenosis. Aorta: The aortic root and ascending aorta are structurally normal, with no evidence of dilitation. Venous: The inferior vena cava is normal in size with greater than 50% respiratory variability, suggesting right atrial pressure of 3 mmHg. IAS/Shunts: The atrial septum is grossly normal.  LEFT VENTRICLE PLAX 2D LVIDd:         3.30 cm   Diastology LVIDs:         2.10 cm   LV e' medial:    4.62 cm/s LV PW:         0.90 cm   LV E/e' medial:  10.6 LV IVS:        0.90 cm   LV e' lateral:   9.01 cm/s LVOT diam:     2.00 cm   LV E/e' lateral: 5.4 LV SV:         41 LV SV Index:   28 LVOT Area:     3.14 cm  RIGHT VENTRICLE             IVC RV S prime:     14.90 cm/s  IVC diam: 1.80 cm TAPSE (M-mode): 1.8 cm LEFT ATRIUM           Index LA diam:      2.40 cm 1.62 cm/m LA Vol (A2C): 9.8 ml  6.59 ml/m   AORTIC VALVE AV Area (Vmax): 1.77 cm AV Vmax:        123.00 cm/s AV Peak Grad:   6.1 mmHg LVOT Vmax:      69.22 cm/s LVOT Vmean:     43.400 cm/s LVOT VTI:       0.130 m  AORTA Ao Root diam: 2.80 cm Ao Asc diam:  3.10 cm MITRAL VALVE               TRICUSPID VALVE MV Area (PHT): 4.60 cm    TR Peak grad:   15.5 mmHg MV Decel Time: 165 msec    TR Vmax:        197.00 cm/s MV E velocity: 49.00 cm/s MV A velocity: 67.40 cm/s  SHUNTS MV E/A ratio:  0.73        Systemic VTI:  0.13 m                            Systemic Diam: 2.00 cm Lennie Odor MD Electronically signed by Lennie Odor MD Signature Date/Time: 02/08/2023/9:48:49 AM    Final    CT HEAD WO CONTRAST ( )  Result Date: 02/08/2023 CLINICAL DATA:  82 year old female code stroke presentation yesterday. Left MCA abnormality, left ACA and MCA branch occlusions and endovascular reperfusion. Posterior left MCA branch extravasation and subsequent blue and coil embolization. EXAM: CT HEAD WITHOUT CONTRAST TECHNIQUE: Contiguous axial images were obtained from the base of the skull through the vertex without intravenous contrast. RADIATION DOSE REDUCTION: This exam was performed according to the departmental dose-optimization program which includes automated exposure control, adjustment of the mA and/or kV according to patient size and/or use of iterative reconstruction technique. COMPARISON:  Head CT 1039 hours yesterday. FINDINGS: Brain: Small volume subarachnoid hemorrhage in the left sylvian fissure primarily. No intraventricular blood. No ventriculomegaly. No midline  shift. Left MCA cytotoxic edema most apparent in the posterior division, lateral perirolandic area series 3, image 18. Additional confluent cytotoxic edema at the posterior ACA/MCA watershed, left vertex and parietal lobe series 3, image 23. Elsewhere gray-white differentiation appears stable including advanced chronic white matter hypodensity, chronic left cerebellar infarcts. No other acute  intracranial hemorrhage identified. Basilar cisterns remain patent. Vascular: Posterior left MCA region embolization sequelae. Skull: Stable, intact. Sinuses/Orbits: Visualized paranasal sinuses and mastoids are stable and well aerated. Other: Left nasoenteric tube now in place. No acute orbit or scalp soft tissue finding. Left gaze persists. IMPRESSION: 1. Cytotoxic edema in the posterior left MCA and left distal ACA/MCA watershed area. Sequelae of posterior left MCA branch embolization. 2. Small volume acute subarachnoid hemorrhage, primarily in the left sylvian fissure. Heidelberg classification 3c: Subarachnoid hemorrhage. 3. No midline shift or significant intracranial mass effect. No IVH or ventriculomegaly. 4. Underlying chronic ischemia. Electronically Signed   By: Odessa Fleming M.D.   On: 02/08/2023 05:22   MR BRAIN WO CONTRAST  Result Date: 02/07/2023 CLINICAL DATA:  MCA branch occlusion status post thrombectomy EXAM: MRI HEAD WITHOUT CONTRAST TECHNIQUE: Multiplanar, multiecho pulse sequences of the brain and surrounding structures were obtained without intravenous contrast. COMPARISON:  Same-day CT/CTA head and neck FINDINGS: Brain: There is moderate volume patchy diffusion restriction in the left cerebral hemisphere involving the caudate head, corona radiata, insula, parietal lobe, and occipital lobe primarily in the MCA and MCA watershed distributions. There is petechial hemorrhage in the parasagittal parietal lobe (12-45). There is FLAIR signal abnormality and SWI signal dropout in the basal cisterns extending to the left MCA cistern, sylvian fissure, and over the parietal lobe likely reflecting a combination of subarachnoid hemorrhage and contrast extravasation. There is no significant mass effect or midline shift. Background parenchymal volume is within expected limits for age. There is advanced background chronic small-vessel ischemic change with remote infarcts in the bilateral thalami and left  cerebellar hemisphere. The pituitary and suprasellar region are grossly unremarkable. Vascular: The major flow voids are present. Skull and upper cervical spine: There is no definite marrow signal abnormality. Sinuses/Orbits: The paranasal sinuses are clear. Bilateral lens implants are in place. The globes and orbits are otherwise unremarkable. Other: The mastoid air cells and middle ear cavities are clear. IMPRESSION: 1. Moderate volume patchy acute infarcts in the left cerebral hemisphere primarily in the MCA and MCA watershed distributions with petechial hemorrhage in the parietal lobe but no significant mass effect or midline shift. 2. Probable combination of extravasated contrast and subarachnoid hemorrhage in the basal cisterns and over the left cerebral hemisphere. Recommend attention on subsequent head CTs. Electronically Signed   By: Lesia Hausen M.D.   On: 02/07/2023 18:30   DG Abd Portable 1V  Result Date: 02/07/2023 CLINICAL DATA:  Nasoenteric feeding tube placement EXAM: PORTABLE ABDOMEN - 1 VIEW COMPARISON:  None Available. FINDINGS: Nasoenteric feeding tube tip overlies the expected distal body of the stomach. The abdominal gas pattern is nonspecific due to a paucity of intra-abdominal gas. Cholecystectomy clips are seen in the right upper quadrant. Contrast is seen within nondilated renal collecting systems bilaterally. IMPRESSION: 1. Nasoenteric feeding tube tip within the distal body of the stomach. Electronically Signed   By: Helyn Numbers M.D.   On: 02/07/2023 17:59   CT ANGIO HEAD NECK W WO CM W PERF (CODE STROKE)  Result Date: 02/07/2023 CLINICAL DATA:  Neuro deficit, acute, stroke suspected. Left-sided visual preference and slurred speech. EXAM: CT ANGIOGRAPHY HEAD  AND NECK CT PERFUSION BRAIN TECHNIQUE: Multidetector CT imaging of the head and neck was performed using the standard protocol during bolus administration of intravenous contrast. Multiplanar CT image reconstructions and  MIPs were obtained to evaluate the vascular anatomy. Carotid stenosis measurements (when applicable) are obtained utilizing NASCET criteria, using the distal internal carotid diameter as the denominator. Multiphase CT imaging of the brain was performed following IV bolus contrast injection. Subsequent parametric perfusion maps were calculated using RAPID software. RADIATION DOSE REDUCTION: This exam was performed according to the departmental dose-optimization program which includes automated exposure control, adjustment of the mA and/or kV according to patient size and/or use of iterative reconstruction technique. CONTRAST:  OMNIPAQUE IOHEXOL 350 MG/ML SOLN COMPARISON:  Head CT 02/07/2023. CTA neck 04/15/2018. MRI/MRA head 04/14/2018. FINDINGS: CTA NECK FINDINGS Aortic arch: Three-vessel arch configuration. Arch vessel origins are patent. Right carotid system: No evidence of dissection, stenosis (50% or greater) or occlusion. Left carotid system: No evidence of dissection, stenosis (50% or greater) or occlusion. Vertebral arteries: Left dominant. No evidence of dissection, stenosis (50% or greater) or occlusion. Skeleton: Mild cervical spondylosis without high-grade spinal canal stenosis. 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 Upper chest: 11 mm solid nodule in the superior segment of the left lower lobe (axial image 186 series 5). Review of the MIP images confirms the above findings CTA HEAD FINDINGS Anterior circulation: Intracranial ICAs are patent without stenosis or aneurysm. Proximal high-grade stenosis versus subocclusive embolus of the left MCA superior M2 division with distal reconstitution (axial images 98-103 series 8), new from the prior MRA. Distal branches are symmetric. Posterior circulation: Normal basilar artery. The SCAs, AICAs and PICAs are patent proximally.  The PCAs are patent proximally without stenosis or aneurysm. Distal branches are symmetric. Venous sinuses: Early phase of contrast. Anatomic variants: Hypoplastic right A1 segment. Azygous A2 segment. Persistent fetal origin of the right PCA with hypoplastic right P1 segment. Review of the MIP images confirms the above findings CT Brain Perfusion Findings: CBF (<30%) Volume: 12mL estimated by automated post processing. However, given significant motion during the first pass arterial bolus, this may be an overestimate. Perfusion (Tmax>6.0s) volume: 72mL estimated by automated post processing. However, given significant motion during the first pass arterial bolus, this may be an overestimate. Mismatch Volume: 60mL Infarction Location:Left frontoparietal junction, MCA territory. IMPRESSION: 1. Proximal high-grade stenosis versus subocclusive embolus of the left MCA superior M2 division with distal reconstitution, new from the prior MRA. 2. CT perfusion demonstrates a 12 mL core infarct in the left frontoparietal junction, MCA territory. However, given significant motion during the first pass arterial bolus, this may be an overestimate. 3. No hemodynamically significant stenosis in the neck. 4. 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 5. A 11 mm solid nodule in the superior segment of the left lower lobe. Consider one of the following in 3 months for both low-risk and high-risk individuals: (a) repeat chest CT, (b) follow-up PET-CT, or (c) tissue sampling. This recommendation follows the consensus statement: Guidelines for Management of Incidental Pulmonary Nodules Detected on CT Images: From the Fleischner Society 2017; Radiology 2017; 284:228-243. Code stroke imaging results were communicated on 02/07/2023 at 11:17 am to provider Dr. Amada Jupiter via telephone, who verbally acknowledged these results. Electronically Signed    By: Elwyn Reach.D.  On: 02/07/2023 11:32   CT HEAD CODE STROKE WO CONTRAST  Result Date: 02/07/2023 CLINICAL DATA:  Code stroke. Neuro deficit, acute, stroke suspected. Left visual preference and slurred speech. EXAM: CT HEAD WITHOUT CONTRAST TECHNIQUE: Contiguous axial images were obtained from the base of the skull through the vertex without intravenous contrast. RADIATION DOSE REDUCTION: This exam was performed according to the departmental dose-optimization program which includes automated exposure control, adjustment of the mA and/or kV according to patient size and/or use of iterative reconstruction technique. COMPARISON:  Head CT 04/28/2021. FINDINGS: Brain: No acute hemorrhage. Possible new loss of gray-white differentiation along the posterior aspect of the left subfrontal gyrus (sagittal image 41 series 6). Background of severe chronic small-vessel disease with old infarct along the left middle frontal gyrus and old perforator infarcts in the bilateral thalami and left cerebellar hemisphere. No hydrocephalus or extra-axial collection. No mass effect or midline shift. Vascular: No hyperdense vessel or unexpected calcification. Skull: No calvarial fracture or suspicious bone lesion. Skull base is unremarkable. Sinuses/Orbits: No acute finding. Other: None. ASPECTS [facility] Stroke Program Early CT Score) - Ganglionic level infarction (caudate, lentiform nuclei, internal capsule, insula, M1-M3 cortex): 7 - Supraganglionic infarction (M4-M6 cortex): 2 Total score (0-10 with 10 being normal): 9 IMPRESSION: 1. Possible new loss of gray-white differentiation along the posterior aspect of the left subfrontal gyrus, which could represent an acute infarct. No acute hemorrhage. 2. Background of severe chronic small-vessel disease with old infarct along the left middle frontal gyrus and old perforator infarcts in the bilateral thalami and left cerebellar hemisphere. Code stroke imaging results were  communicated on 02/07/2023 at 10:47 am to provider Dr. Amada Jupiter via secure text paging. Electronically Signed   By: Orvan Falconer M.D.   On: 02/07/2023 10:47     PHYSICAL EXAM  Temp:  [97.8 F (36.6 C)-98.2 F (36.8 C)] 98.2 F (36.8 C) (10/30 1958) Pulse Rate:  [72-88] 87 (10/30 1958) Resp:  [16-17] 16 (10/30 1958) BP: (108-124)/(61-74) 122/70 (10/30 1958) SpO2:  [97 %-98 %] 98 % (10/30 1958)  General - Well nourished, well developed, in no apparent distress. Cardiovascular - Regular rhythm and rate.  Neuro - awake, alert sitting up in the chair. Pt eyes open, global aphasia, not following commands, able to have sounds out but not meaningful. Left gaze preference, does cross midline, tracks,  right hemianopia vs. Visual neglect, no blink to threat on the right, PERRL. Right facial droop. Tongue protrusion not cooperative. LUE at least 3+/5 and LLE at least 3/5, RUE spontaneous, purposeful movement against gravity, RLE withdraws  ASSESSMENT/PLAN Ms. DENELLE SIFFORD is a 82 y.o. female with history of hypertension, hyperlipidemia, stroke in 2019 admitted for slurred speech, aphasia, left gaze and right-sided weakness. No tPA given due to outside window.    Stroke:  left MCA and ACA infarcts s/p IR with TICI3 on L A4 and TICI2c on L M3 complicated by extravasation at left M3 s/p coil embolization, embolic pattern, likely secondary to cardioembolic source CT head no acute infarct, questionable left frontal early sign of stroke CT head and neck left M2 high-grade stenosis versus near occlusion CTP 12/72 Status post IR with left A4 occlusion with TICI3 reperfusion, left M3 occlusion with TICI2c complicated by extravasation status post coil embolization MRI left MCA and ACA infarcts with hemorrhagic transformation and questionable SAH CT 12/26 left MCA and left distal ACA/MCA watershed area infarcts.  Small volume acute SAH, primary in the left sylvian fissure CT repeat 10/29 stable 2D  Echo EF 55 to 60% LE venous Doppler no DVT Will consider loop recorder if neuro significantly improved LDL 101 HgbA1c 5.2 Heparin subcu for VTE prophylaxis aspirin 81 mg daily prior to admission, now on ASA 81mg  Ongoing aggressive stroke risk factor management Therapy recommendations:  SNF Disposition: Pending  History of stroke 03/2018 admitted for small left CC and cingulate gyrus infarct left ACA territory.  MRA head negative.  CTA neck on markable.  LDL 39, A1c 5.1.  EF 60 to 60%.  Had anemia with PRBC transfusion.  Discharged on aspirin and Zocor 20. Outpatient 30-day CardioNet monitor negative for A-fib.  Hypertension Stable BP goal less than 160 given SAH Long term BP goal normotensive  Hyperlipidemia Home meds: None LDL 101, goal < 70 Now on Crestor 20 Continue statin at discharge  Dysphagia Now on Dys 2 diet with thin liquid Nocturnal feeding - she is eating some food  FW 100 Q6h Encourage p.o. intake dietitian consulted, on calorie count  Other Stroke Risk Factors Advanced age  Other Active Problems Hypophosphatemia Phos 2.1 replaced  Hospital day # 5   Marvel Plan, MD PhD Stroke Neurology 02/12/2023 9:08 PM

## 2023-02-12 NOTE — NC FL2 (Addendum)
[place] MEDICAID FL2 LEVEL OF CARE FORM     IDENTIFICATION  Patient Name: Kristen Parks Birthdate: 1941-02-11 Sex: female Admission Date (Current Location): 02/07/2023  [facility] and [place] Number:  Producer, television/film/video and Address:  [facility], [address] [place], [place] [address]      Provider Number: [id number]  Attending Physician Name and Address:  Stroke, Md, MD  Relative Name and Phone Number:       Current Level of Care: Hospital Recommended Level of Care: Skilled Nursing Facility Prior Approval Number:    Date Approved/Denied:   PASRR Number: [id number]  Discharge Plan: SNF    Current Diagnoses: Patient Active Problem List   Diagnosis Date Noted   Protein-calorie malnutrition, severe 02/09/2023   Stroke (cerebrum) (HCC) 02/07/2023   Female bladder prolapse 05/01/2018   Symptomatic anemia 04/14/2018   CVA (cerebral vascular accident) (HCC) 04/14/2018   Essential hypertension    Dyslipidemia    Dementia (HCC)    Acquired hypothyroidism     Orientation RESPIRATION BLADDER Height & Weight     Self  Normal Incontinent Weight: 110 lb 10.7 oz (50.2 kg) Height:  5\' 2"  (157.5 cm)  BEHAVIORAL SYMPTOMS/MOOD NEUROLOGICAL BOWEL NUTRITION STATUS      Incontinent Diet (See DC summary)  AMBULATORY STATUS COMMUNICATION OF NEEDS Skin   Total Care Verbally Surgical wounds (Puncture Wound Groin/ Gauze)                       Personal Care Assistance Level of Assistance  Total care       Total Care Assistance: Maximum assistance   Functional Limitations Info  Speech     Speech Info: Impaired    SPECIAL CARE FACTORS FREQUENCY  PT (By licensed PT), OT (By licensed OT)     PT Frequency: 5x/week OT Frequency: 5x/week            Contractures      Additional Factors Info  Code Status, Allergies Code Status Info: DNR Allergies Info: Latex, Penicillins, Sulfa Antibiotics           Current Medications  (02/12/2023):  This is the current hospital active medication list Current Facility-Administered Medications  Medication Dose Route Frequency Provider Last Rate Last Admin    stroke: early stages of recovery book   Does not apply Once Rejeana Brock, MD       acetaminophen (TYLENOL) tablet 650 mg  650 mg Oral Q4H PRN de Melchor Amour, Jerilynn Mages, MD       Or   acetaminophen (TYLENOL) 160 MG/5ML solution 650 mg  650 mg Per Tube Q4H PRN de Melchor Amour, Jerilynn Mages, MD       Or   acetaminophen (TYLENOL) suppository 650 mg  650 mg Rectal Q4H PRN de Melchor Amour, Jerilynn Mages, MD       aspirin chewable tablet 81 mg  81 mg Oral Daily Elmer Picker, NP   81 mg at 02/12/23 7846   Chlorhexidine Gluconate Cloth 2 % PADS 6 each  6 each Topical Daily Rejeana Brock, MD   6 each at 02/11/23 0907   feeding supplement (ENSURE ENLIVE / ENSURE PLUS) liquid 237 mL  237 mL Oral BID BM Marvel Plan, MD   237 mL at 02/12/23 0948   feeding supplement (OSMOLITE 1.5 CAL) liquid 500 mL  500 mL Per Tube Continuous Marvel Plan, MD 50 mL/hr at 02/11/23 1719 500 mL at 02/11/23 1719   feeding  supplement (PROSource TF20) liquid 60 mL  60 mL Per Tube Daily Rejeana Brock, MD   60 mL at 02/12/23 0948   heparin injection 5,000 Units  5,000 Units Subcutaneous Q12H Marvel Plan, MD   5,000 Units at 02/12/23 0950   insulin aspart (novoLOG) injection 0-15 Units  0-15 Units Subcutaneous Q4H Erick Blinks, MD   2 Units at 02/12/23 0355   levothyroxine (SYNTHROID) tablet 112 mcg  112 mcg Oral Q24H Marvel Plan, MD   112 mcg at 02/12/23 0500   multivitamin with minerals tablet 1 tablet  1 tablet Oral Daily Marvel Plan, MD   1 tablet at 02/12/23 4401   Oral care mouth rinse  15 mL Mouth Rinse 4 times per day Erick Blinks, MD   15 mL at 02/12/23 0272   Oral care mouth rinse  15 mL Mouth Rinse PRN Erick Blinks, MD       pneumococcal 20-valent conjugate vaccine (PREVNAR 20) injection 0.5 mL  0.5 mL  Intramuscular Tomorrow-1000 Hetty Blend C, NP       rosuvastatin (CRESTOR) tablet 20 mg  20 mg Oral Daily Marvel Plan, MD   20 mg at 02/12/23 0950   sodium chloride flush (NS) 0.9 % injection 3 mL  3 mL Intravenous Once Royanne Foots, DO         Discharge Medications: Please see discharge summary for a list of discharge medications.  Relevant Imaging Results:  Relevant Lab Results:   Additional Information SSN: [id number]  Antion Felipa Emory, Student-Social Work

## 2023-02-12 NOTE — Progress Notes (Signed)
Nutrition Follow-up  DOCUMENTATION CODES:  Severe malnutrition in context of social or environmental circumstances  INTERVENTION:  Continue nocturnal TF via cortrak tube: Osmolite 1.5 @ 50 ml/h over 10 hours (500 ml per day) Prosource TF20 60 ml daily Provides 830 kcal, 51 gm protein, 380 ml free water daily MVI daily  Continue current diet as ordered Encourage PO intake at meals Feeding assistance Family to order meals when present Add ice cream and peaches to all trays 24-hour kcal count Nectar Thick Mighty Shake TID, each supplement provides 330kcal and 9g protein   NUTRITION DIAGNOSIS:  Severe Malnutrition related to social / environmental circumstances (dementia) as evidenced by severe muscle depletion, severe fat depletion. Ongoing.   GOAL:  Patient will meet greater than or equal to 90% of their needs Progressing, TF and diet in place  MONITOR:  TF tolerance  REASON FOR ASSESSMENT:  Consult Assessment of nutrition requirement/status, Calorie Count  ASSESSMENT:  Pt with PMH of stroke, HTN, HLD, and dementia admitted with stroke s/p IR.   10/25 - s/p cortrak placement; tip gastric per xray 10/28 - transitioned to nocturnal feeds, transferred out of ICU, DYS3 diet initiated  Pt resting in bed at the time of assessment. Does not participate in conversation but several family members at bedside able to provide a nutrition hx. Inquired about breakfast and they state that she ate ~ 50% of her grits and bites of a few other items items on her tray. Has also consumed at 25% of her ensure enlive. NT states that she did consume ~50% of an apple juice as well.   Pt overall stable to discharge to SNF but is unable to leave with cortrak in place. Asked by MD to evaluate progress on PO intake  Family reports that at baseline, pt will eat a bowl of honey nut cherrios for breakfast, a pack of crackers or other small item for lunch, and then for dinner will eat a small (but more  substantial) meal such as a chicken leg and some vegetables. Pt is likely consuming <1500 kcal a day at baseline and she has been eating this way for many years. Family reports that when pt does not eat a meal she will consume a boost or ensure. Will adjust order to Boost Plus to see if they are better tolerated. Likes chocolate. Will also add ice cream and diced peaches to tray as family reports they are consistently eaten.   Will start 24 hour kcal count to determine if PO intake is sufficient to maintain energy and hydration status to prevent a repeat hospitalization.  Discussed plan with RN, MD, NT, and Case Manager  Admission weight on bedscale: 49.5 kg Current weight: 50.2 kg   Intake/Output Summary (Last 24 hours) at 02/12/2023 1118 Last data filed at 02/12/2023 0830 Gross per 24 hour  Intake 60 ml  Output 1050 ml  Net -990 ml  Net IO Since Admission: 995.55 mL [02/12/23 1118]  Average Meal Intake: 10/28-10/29: 25% intake x 3 recorded meals  Nutritionally Relevant Medications: Scheduled Meds:  feeding supplement  237 mL Oral BID BM   feeding supplement (PROSource TF20)  60 mL Per Tube Daily   insulin aspart  0-15 Units Subcutaneous Q4H   multivitamin with minerals  1 tablet Oral Daily   rosuvastatin  20 mg Oral Daily   Continuous Infusions:  feeding supplement (OSMOLITE 1.5 CAL) 500 mL (02/11/23 1719)   Labs Reviewed: CBG ranges from 108-136 mg/dL over the last 24 hours HgbA1c  5.2%  Diet Order:   Diet Order             DIET DYS 3 Room service appropriate? No; Fluid consistency: Thin  Diet effective now                   EDUCATION NEEDS:  No education needs have been identified at this time  Skin:  Skin Assessment: Reviewed RN Assessment  Last BM:  10/28  Height:  Ht Readings from Last 1 Encounters:  02/10/23 5\' 2"  (1.575 m)    Weight:  Wt Readings from Last 1 Encounters:  02/10/23 50.2 kg    BMI:  Body mass index is 20.24 kg/m.  Estimated  Nutritional Needs:  Kcal:  1200-1400 kcal/d Protein:  55-70g/d Fluid:  >1.5 L/day   Greig Castilla, RD, LDN Clinical Dietitian RD pager # available in [facility]  After hours/weekend pager # available in [facility]

## 2023-02-12 NOTE — Plan of Care (Signed)
  Problem: Education: Goal: Knowledge of disease or condition will improve Outcome: Not Progressing Goal: Knowledge of secondary prevention will improve (MUST DOCUMENT ALL) Outcome: Not Progressing   Problem: Ischemic Stroke/TIA Tissue Perfusion: Goal: Complications of ischemic stroke/TIA will be minimized Outcome: Progressing   Problem: Coping: Goal: Will verbalize positive feelings about self Outcome: Progressing Goal: Will identify appropriate support needs Outcome: Progressing   Problem: Health Behavior/Discharge Planning: Goal: Ability to manage health-related needs will improve Outcome: Not Progressing   Problem: Self-Care: Goal: Ability to participate in self-care as condition permits will improve Outcome: Not Progressing Goal: Verbalization of feelings and concerns over difficulty with self-care will improve Outcome: Not Progressing Goal: Ability to communicate needs accurately will improve Outcome: Not Progressing   Problem: Nutrition: Goal: Risk of aspiration will decrease Outcome: Progressing Goal: Dietary intake will improve Outcome: Progressing   Problem: Education: Goal: Understanding of CV disease, CV risk reduction, and recovery process will improve Outcome: Progressing Goal: Individualized Educational Video(s) Outcome: Progressing   Problem: Activity: Goal: Ability to return to baseline activity level will improve Outcome: Progressing   Problem: Cardiovascular: Goal: Ability to achieve and maintain adequate cardiovascular perfusion will improve Outcome: Progressing Goal: Vascular access site(s) Level 0-1 will be maintained Outcome: Progressing   Problem: Health Behavior/Discharge Planning: Goal: Ability to safely manage health-related needs after discharge will improve Outcome: Progressing   Problem: Education: Goal: Ability to describe self-care measures that may prevent or decrease complications (Diabetes Survival Skills Education) will  improve Outcome: Progressing Goal: Individualized Educational Video(s) Outcome: Progressing   Problem: Coping: Goal: Ability to adjust to condition or change in health will improve Outcome: Progressing   Problem: Fluid Volume: Goal: Ability to maintain a balanced intake and output will improve Outcome: Progressing   Problem: Health Behavior/Discharge Planning: Goal: Ability to identify and utilize available resources and services will improve Outcome: Progressing Goal: Ability to manage health-related needs will improve Outcome: Progressing   Problem: Metabolic: Goal: Ability to maintain appropriate glucose levels will improve Outcome: Progressing   Problem: Nutritional: Goal: Maintenance of adequate nutrition will improve Outcome: Progressing Goal: Progress toward achieving an optimal weight will improve Outcome: Progressing   Problem: Skin Integrity: Goal: Risk for impaired skin integrity will decrease Outcome: Progressing   Problem: Tissue Perfusion: Goal: Adequacy of tissue perfusion will improve Outcome: Progressing   Problem: Education: Goal: Knowledge of General Education information will improve Description: Including pain rating scale, medication(s)/side effects and non-pharmacologic comfort measures Outcome: Progressing   Problem: Clinical Measurements: Goal: Ability to maintain clinical measurements within normal limits will improve Outcome: Progressing Goal: Will remain free from infection Outcome: Progressing Goal: Diagnostic test results will improve Outcome: Progressing Goal: Respiratory complications will improve Outcome: Progressing Goal: Cardiovascular complication will be avoided Outcome: Progressing   Problem: Nutrition: Goal: Adequate nutrition will be maintained Outcome: Progressing   Problem: Elimination: Goal: Will not experience complications related to bowel motility Outcome: Progressing Goal: Will not experience complications  related to urinary retention Outcome: Progressing   Problem: Pain Management: Goal: General experience of comfort will improve Outcome: Progressing   Problem: Safety: Goal: Ability to remain free from injury will improve Outcome: Progressing   Problem: Skin Integrity: Goal: Risk for impaired skin integrity will decrease Outcome: Progressing

## 2023-02-13 DIAGNOSIS — I6340 Cerebral infarction due to embolism of unspecified cerebral artery: Secondary | ICD-10-CM | POA: Diagnosis not present

## 2023-02-13 LAB — GLUCOSE, CAPILLARY
Glucose-Capillary: 111 mg/dL — ABNORMAL HIGH (ref 70–99)
Glucose-Capillary: 111 mg/dL — ABNORMAL HIGH (ref 70–99)
Glucose-Capillary: 115 mg/dL — ABNORMAL HIGH (ref 70–99)
Glucose-Capillary: 121 mg/dL — ABNORMAL HIGH (ref 70–99)
Glucose-Capillary: 132 mg/dL — ABNORMAL HIGH (ref 70–99)
Glucose-Capillary: 96 mg/dL (ref 70–99)
Glucose-Capillary: 97 mg/dL (ref 70–99)

## 2023-02-13 MED ORDER — INSULIN ASPART 100 UNIT/ML IJ SOLN
0.0000 [IU] | Freq: Three times a day (TID) | INTRAMUSCULAR | Status: DC
  Administered 2023-02-14 (×2): 2 [IU] via SUBCUTANEOUS

## 2023-02-13 MED ORDER — ORAL CARE MOUTH RINSE: 15.0000 mL | OROMUCOSAL | Status: DC | PRN

## 2023-02-13 MED ORDER — ORAL CARE MOUTH RINSE: 15.0000 mL | OROMUCOSAL | Status: DC

## 2023-02-13 NOTE — Progress Notes (Addendum)
STROKE TEAM PROGRESS NOTE   SUBJECTIVE (INTERVAL HISTORY) Pt lying in bed,   family at the  bedside. No significant neuro changes. 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 Pending SNF.   OBJECTIVE Temp:  [97.6 F (36.4 C)-98.2 F (36.8 C)] 97.6 F (36.4 C) (10/31 1227) Pulse Rate:  [78-88] 88 (10/31 1227) Cardiac Rhythm: Normal sinus rhythm (10/31 0800) Resp:  [16] 16 (10/31 1227) BP: (118-128)/(67-75) 122/67 (10/31 1227) SpO2:  [98 %-100 %] 100 % (10/31 1227) Weight:  [51.9 kg] 51.9 kg (10/31 0458)  Recent Labs  Lab 02/12/23 2003 02/13/23 0005 02/13/23 0416 02/13/23 0745 02/13/23 1225  GLUCAP 136* 132* 115* 96 97   Recent Labs  Lab 02/07/23 1821 02/08/23 0631 02/08/23 1744 02/09/23 0709 02/10/23 0840 02/11/23 0531 02/12/23 0646  NA  --  141  --  139 134* 139 135  K  --  4.2  --  3.5 3.9 4.1 4.2  CL  --  111  --  106 107 107 107  CO2  --  18*  --  22 18* 23 23  GLUCOSE  --  143*  --  112* 142* 117* 124*  BUN  --  17  --  15 18 21 22   CREATININE  --  0.73  --  0.59 0.74 0.64 0.70  CALCIUM  --  9.3  --  9.2 9.2 9.6 9.4  MG 1.8 1.8 2.0 1.7 1.9  --   --   PHOS 2.5 2.4* 1.7* 2.3* 2.1*  --   --    Recent Labs  Lab 02/07/23 1045  AST 20  ALT 11  ALKPHOS 180*  BILITOT 0.7  PROT 7.5  ALBUMIN 3.8   Recent Labs  Lab 02/07/23 1045 02/07/23 1051 02/08/23 0631 02/09/23 0709 02/10/23 0840 02/11/23 0531 02/12/23 0646  WBC 6.5  --  9.6 7.8 12.5* 10.8* 10.1  NEUTROABS 4.8  --   --   --   --   --   --   HGB 14.6   < > 12.4 12.7 13.7 12.4 12.2  HCT 44.2   < > 36.2 36.9 39.7 37.3 37.4  MCV 94.4  --  93.3 90.7 91.3 92.8 94.2  PLT 291  --  210 231 224 229 249   < > = values in this interval not displayed.        Component Value Date/Time   CHOL 157 02/08/2023 0630   TRIG 100 02/08/2023 0630   HDL 36 (L) 02/08/2023 0630   CHOLHDL 4.4 02/08/2023 0630   VLDL 20 02/08/2023 0630   LDLCALC 101 (H) 02/08/2023 0630   Lab Results   Component Value Date   HGBA1C 5.2 02/07/2023     Recent Labs  Lab 02/07/23 1029  ETH <10    I have personally reviewed the radiological images below and agree with the radiology interpretations.  IR PERCUTANEOUS ART THROMBECTOMY/INFUSION INTRACRANIAL INC DIAG ANGIO  Result Date: 02/11/2023 INDICATION: Kristen Parks is an 82 year old female presenting with left-gaze preference, aphasia, dysarthria, right-sided weakness; NIHSS 19. Her last known well was 10 p.m. on 02/06/2023. Her past medical history significant for hypertension, dyslipidemia, anemia, hypothyroidism, Left corpus collosum infarction in 2019, chronic small vessel disease; baseline modified Rankin scale 3. Head CT showed no evidence of hemorrhage. On my personal review, hypodensity in the posterior left ACA and posterior left MCA territories was seen (MCA - ASPECTS 6). No IV thrombolytic was administered as patient was outside the window. CT  angiogram of the head and neck showed an occlusion of the distal left pericallosal artery (A4 segment) in the narrowing of the left M2/MCA posterior division branch, concerning for possible nonocclusive embolus versus stenosis. Findings discussed with patient's daughter including risks and potential benefits of intervention. Decision made to proceed with mechanical thrombectomy. EXAM: ULTRASOUND-GUIDED VASCULAR ACCESS DIAGNOSTIC CEREBRAL ANGIOGRAM MECHANICAL THROMBECTOMY ENDOVASCULAR VESSEL EMBOLIZATION FLAT PANEL HEAD CT COMPARISON:  CT/CT angiogram of the head and neck February 07, 2023. MEDICATIONS: Ancef 2 g IV. ANESTHESIA/SEDATION: The procedure was performed under general anesthesia. CONTRAST:  80 mL of Omnipaque 300 milligram/mL FLUOROSCOPY: Radiation Exposure Index (as provided by the fluoroscopic device): 982 mGy Kerma COMPLICATIONS: SIR LEVEL C - Requires therapy, minor hospitalization (<48 hrs). TECHNIQUE: Informed written consent was obtained from the patient's daughter after a thorough  discussion of the procedural risks, benefits and alternatives. All questions were addressed. Maximal Sterile Barrier Technique was utilized including caps, mask, sterile gowns, sterile gloves, sterile drape, hand hygiene and skin antiseptic. A timeout was performed prior to the initiation of the procedure. The right groin was prepped and draped in the usual sterile fashion. Using a micropuncture kit and the modified Seldinger technique, access was gained to the right common femoral artery and an 8 French sheath was placed. Real-time ultrasound guidance was utilized for vascular access including the acquisition of a permanent ultrasound image documenting patency of the accessed vessel. Under fluoroscopy, a Zoom 88 guide catheter was navigated over a 6 [place] VTK catheter and a 0.035" Terumo Glidewire into the aortic arch. The catheter was placed into the left common carotid artery. The VTK catheter was removed. Using biplane roadmap guidance, the a was advanced over the wire into the mid cervical segment of the left ICA. Frontal and lateral angiograms of the head were obtained. FINDINGS: 1. Mild atherosclerotic changes of the right common femoral artery without hemodynamically significant stenosis. 2. Increased tortuosity of the cervical segment of the left ICA. No significant atherosclerotic disease or stenosis at the left carotid bifurcation. Occlusion of the distal right pericallosal artery at the A4 segment. 3. Occlusion of a left M3/MCA posterior region branch. 4. Increased and early capillary blush in the left posterior MCA territory in the parietal region with early venous drainage, suggestive of ongoing ischemia. 5. No left M2/MCA occlusion or significant stenosis. PROCEDURE: Using biplane roadmap guidance, a Socrates 38 aspiration catheter was navigated over Colossus 35 guidewire into the left A1/ACA. The aspiration catheter was then advanced over the wire to the level of occlusion in the A4 segment and  connected to an aspiration pump. Continuous aspiration was performed for 2 minutes. The guide catheter was connected to a VacLok syringe. The aspiration catheter was subsequently removed under constant aspiration. The guide catheter was aspirated for debris. Left internal carotid artery angiograms with frontal and lateral views of the head showed recanalization of the left pericallosal artery with small residual, nonocclusive filling defect well maintained brisk anterograde flow. Persistent occlusion of the distal left M3/MCA segment was seen. Using biplane roadmap guidance, a Socrates 38 aspiration catheter was navigated over Colossus 35 guidewire into the left M2/MCA. The aspiration catheter was then advanced over the wire to the level of occlusion in the M3 segment and connected to an aspiration pump. Continuous aspiration was performed for 2 minutes. The guide catheter was connected to a VacLok syringe. The aspiration catheter was subsequently removed under constant aspiration. The guide catheter was aspirated for debris. Left internal carotid artery angiograms with frontal and  lateral views of the head showed recanalization of the M3 segment with slow distal flow. However, active contrast extravasation was noted at the M3 level. Using biplane roadmap guidance, a phenom 21 microcatheter was navigated over an Aristotle 14 micro guidewire into the left M2/MCA posterior division branch and then into the M3 segment, at the level of bursae contrast extravasation. Then, a detachable coils were placed at the level of contrast extravasation (2 mm x 6 cm target XL 360 soft x2). Control angiograms were obtained. Then, the microcatheter was prepped with dextrose 5%. Subsequently infusion of n-BCA was performed within the recently deployed coil mass. The microcatheter was then removed under constant aspiration. Left internal carotid artery angiograms with frontal and lateral views of the head showed distal occlusion of the  left M3/MCA posterior division branch with complete resolution of contrast extravasation. Flat panel CT of the head was obtained and post processed in a separate workstation with concurrent attending physician supervision. Selected images were sent to PACS. Moderate hyperdensity in the left sylvian fissure was noted, consistent with contrast extravasation. Delayed left internal carotid artery angiograms with frontal and lateral views of the head showed stable findings with persistent patency of the left pericallosal artery, occlusion of the distal left M3/MCA at the level of the coil mass with no evidence of contrast extravasation. The catheter was subsequently withdrawn. Right common femoral artery angiogram was obtained in right anterior oblique view. The puncture is at the level of the common femoral artery. The artery has normal caliber, adequate for closure device. The sheath was exchanged over the wire for a Perclose prostyle which was utilized for access closure. Immediate hemostasis was achieved. IMPRESSION: 1. Mechanical thrombectomy performed for treatment of a left pericallosal and left M3/MCA occlusion. 2. Recanalization of the left pericallosal artery achieved (TICI 3). 3. Recanalization of the left M3/MCA with slow flow, complicated by contrast extravasation requiring occlusion of the distal M3 branch with coils and n-BCA. Final left MCA patency status is a TICI 2B. PLAN: TRansfer to ICU. Electronically Signed   By: Baldemar Lenis M.D.   On: 02/11/2023 11:03   IR US Guide Vasc Access Right  Result Date: 02/11/2023 INDICATION: Kristen Parks is an 82 year old female presenting with left-gaze preference, aphasia, dysarthria, right-sided weakness; NIHSS 19. Her last known well was 10 p.m. on 02/06/2023. Her past medical history significant for hypertension, dyslipidemia, anemia, hypothyroidism, Left corpus collosum infarction in 2019, chronic small vessel disease; baseline modified  Rankin scale 3. Head CT showed no evidence of hemorrhage. On my personal review, hypodensity in the posterior left ACA and posterior left MCA territories was seen (MCA - ASPECTS 6). No IV thrombolytic was administered as patient was outside the window. CT angiogram of the head and neck showed an occlusion of the distal left pericallosal artery (A4 segment) in the narrowing of the left M2/MCA posterior division branch, concerning for possible nonocclusive embolus versus stenosis. Findings discussed with patient's daughter including risks and potential benefits of intervention. Decision made to proceed with mechanical thrombectomy. EXAM: ULTRASOUND-GUIDED VASCULAR ACCESS DIAGNOSTIC CEREBRAL ANGIOGRAM MECHANICAL THROMBECTOMY ENDOVASCULAR VESSEL EMBOLIZATION FLAT PANEL HEAD CT COMPARISON:  CT/CT angiogram of the head and neck February 07, 2023. MEDICATIONS: Ancef 2 g IV. ANESTHESIA/SEDATION: The procedure was performed under general anesthesia. CONTRAST:  80 mL of Omnipaque 300 milligram/mL FLUOROSCOPY: Radiation Exposure Index (as provided by the fluoroscopic device): 982 mGy Kerma COMPLICATIONS: SIR LEVEL C - Requires therapy, minor hospitalization (<48 hrs). TECHNIQUE: Informed written consent was  obtained from the patient's daughter after a thorough discussion of the procedural risks, benefits and alternatives. All questions were addressed. Maximal Sterile Barrier Technique was utilized including caps, mask, sterile gowns, sterile gloves, sterile drape, hand hygiene and skin antiseptic. A timeout was performed prior to the initiation of the procedure. The right groin was prepped and draped in the usual sterile fashion. Using a micropuncture kit and the modified Seldinger technique, access was gained to the right common femoral artery and an 8 French sheath was placed. Real-time ultrasound guidance was utilized for vascular access including the acquisition of a permanent ultrasound image documenting patency of the  accessed vessel. Under fluoroscopy, a Zoom 88 guide catheter was navigated over a 6 [place] VTK catheter and a 0.035" Terumo Glidewire into the aortic arch. The catheter was placed into the left common carotid artery. The VTK catheter was removed. Using biplane roadmap guidance, the a was advanced over the wire into the mid cervical segment of the left ICA. Frontal and lateral angiograms of the head were obtained. FINDINGS: 1. Mild atherosclerotic changes of the right common femoral artery without hemodynamically significant stenosis. 2. Increased tortuosity of the cervical segment of the left ICA. No significant atherosclerotic disease or stenosis at the left carotid bifurcation. Occlusion of the distal right pericallosal artery at the A4 segment. 3. Occlusion of a left M3/MCA posterior region branch. 4. Increased and early capillary blush in the left posterior MCA territory in the parietal region with early venous drainage, suggestive of ongoing ischemia. 5. No left M2/MCA occlusion or significant stenosis. PROCEDURE: Using biplane roadmap guidance, a Socrates 38 aspiration catheter was navigated over Colossus 35 guidewire into the left A1/ACA. The aspiration catheter was then advanced over the wire to the level of occlusion in the A4 segment and connected to an aspiration pump. Continuous aspiration was performed for 2 minutes. The guide catheter was connected to a VacLok syringe. The aspiration catheter was subsequently removed under constant aspiration. The guide catheter was aspirated for debris. Left internal carotid artery angiograms with frontal and lateral views of the head showed recanalization of the left pericallosal artery with small residual, nonocclusive filling defect well maintained brisk anterograde flow. Persistent occlusion of the distal left M3/MCA segment was seen. Using biplane roadmap guidance, a Socrates 38 aspiration catheter was navigated over Colossus 35 guidewire into the left M2/MCA.  The aspiration catheter was then advanced over the wire to the level of occlusion in the M3 segment and connected to an aspiration pump. Continuous aspiration was performed for 2 minutes. The guide catheter was connected to a VacLok syringe. The aspiration catheter was subsequently removed under constant aspiration. The guide catheter was aspirated for debris. Left internal carotid artery angiograms with frontal and lateral views of the head showed recanalization of the M3 segment with slow distal flow. However, active contrast extravasation was noted at the M3 level. Using biplane roadmap guidance, a phenom 21 microcatheter was navigated over an Aristotle 14 micro guidewire into the left M2/MCA posterior division branch and then into the M3 segment, at the level of bursae contrast extravasation. Then, a detachable coils were placed at the level of contrast extravasation (2 mm x 6 cm target XL 360 soft x2). Control angiograms were obtained. Then, the microcatheter was prepped with dextrose 5%. Subsequently infusion of n-BCA was performed within the recently deployed coil mass. The microcatheter was then removed under constant aspiration. Left internal carotid artery angiograms with frontal and lateral views of the head showed distal occlusion  of the left M3/MCA posterior division branch with complete resolution of contrast extravasation. Flat panel CT of the head was obtained and post processed in a separate workstation with concurrent attending physician supervision. Selected images were sent to PACS. Moderate hyperdensity in the left sylvian fissure was noted, consistent with contrast extravasation. Delayed left internal carotid artery angiograms with frontal and lateral views of the head showed stable findings with persistent patency of the left pericallosal artery, occlusion of the distal left M3/MCA at the level of the coil mass with no evidence of contrast extravasation. The catheter was subsequently  withdrawn. Right common femoral artery angiogram was obtained in right anterior oblique view. The puncture is at the level of the common femoral artery. The artery has normal caliber, adequate for closure device. The sheath was exchanged over the wire for a Perclose prostyle which was utilized for access closure. Immediate hemostasis was achieved. IMPRESSION: 1. Mechanical thrombectomy performed for treatment of a left pericallosal and left M3/MCA occlusion. 2. Recanalization of the left pericallosal artery achieved (TICI 3). 3. Recanalization of the left M3/MCA with slow flow, complicated by contrast extravasation requiring occlusion of the distal M3 branch with coils and n-BCA. Final left MCA patency status is a TICI 2B. PLAN: TRansfer to ICU. Electronically Signed   By: Baldemar Lenis M.D.   On: 02/11/2023 11:03   IR CT Head Ltd  Result Date: 02/11/2023 INDICATION: GWENEVIERE BIER is an 82 year old female presenting with left-gaze preference, aphasia, dysarthria, right-sided weakness; NIHSS 19. Her last known well was 10 p.m. on 02/06/2023. Her past medical history significant for hypertension, dyslipidemia, anemia, hypothyroidism, Left corpus collosum infarction in 2019, chronic small vessel disease; baseline modified Rankin scale 3. Head CT showed no evidence of hemorrhage. On my personal review, hypodensity in the posterior left ACA and posterior left MCA territories was seen (MCA - ASPECTS 6). No IV thrombolytic was administered as patient was outside the window. CT angiogram of the head and neck showed an occlusion of the distal left pericallosal artery (A4 segment) in the narrowing of the left M2/MCA posterior division branch, concerning for possible nonocclusive embolus versus stenosis. Findings discussed with patient's daughter including risks and potential benefits of intervention. Decision made to proceed with mechanical thrombectomy. EXAM: ULTRASOUND-GUIDED VASCULAR ACCESS DIAGNOSTIC  CEREBRAL ANGIOGRAM MECHANICAL THROMBECTOMY ENDOVASCULAR VESSEL EMBOLIZATION FLAT PANEL HEAD CT COMPARISON:  CT/CT angiogram of the head and neck February 07, 2023. MEDICATIONS: Ancef 2 g IV. ANESTHESIA/SEDATION: The procedure was performed under general anesthesia. CONTRAST:  80 mL of Omnipaque 300 milligram/mL FLUOROSCOPY: Radiation Exposure Index (as provided by the fluoroscopic device): 982 mGy Kerma COMPLICATIONS: SIR LEVEL C - Requires therapy, minor hospitalization (<48 hrs). TECHNIQUE: Informed written consent was obtained from the patient's daughter after a thorough discussion of the procedural risks, benefits and alternatives. All questions were addressed. Maximal Sterile Barrier Technique was utilized including caps, mask, sterile gowns, sterile gloves, sterile drape, hand hygiene and skin antiseptic. A timeout was performed prior to the initiation of the procedure. The right groin was prepped and draped in the usual sterile fashion. Using a micropuncture kit and the modified Seldinger technique, access was gained to the right common femoral artery and an 8 French sheath was placed. Real-time ultrasound guidance was utilized for vascular access including the acquisition of a permanent ultrasound image documenting patency of the accessed vessel. Under fluoroscopy, a Zoom 88 guide catheter was navigated over a 6 [place] VTK catheter and a 0.035" Terumo Glidewire into the aortic  arch. The catheter was placed into the left common carotid artery. The VTK catheter was removed. Using biplane roadmap guidance, the a was advanced over the wire into the mid cervical segment of the left ICA. Frontal and lateral angiograms of the head were obtained. FINDINGS: 1. Mild atherosclerotic changes of the right common femoral artery without hemodynamically significant stenosis. 2. Increased tortuosity of the cervical segment of the left ICA. No significant atherosclerotic disease or stenosis at the left carotid bifurcation.  Occlusion of the distal right pericallosal artery at the A4 segment. 3. Occlusion of a left M3/MCA posterior region branch. 4. Increased and early capillary blush in the left posterior MCA territory in the parietal region with early venous drainage, suggestive of ongoing ischemia. 5. No left M2/MCA occlusion or significant stenosis. PROCEDURE: Using biplane roadmap guidance, a Socrates 38 aspiration catheter was navigated over Colossus 35 guidewire into the left A1/ACA. The aspiration catheter was then advanced over the wire to the level of occlusion in the A4 segment and connected to an aspiration pump. Continuous aspiration was performed for 2 minutes. The guide catheter was connected to a VacLok syringe. The aspiration catheter was subsequently removed under constant aspiration. The guide catheter was aspirated for debris. Left internal carotid artery angiograms with frontal and lateral views of the head showed recanalization of the left pericallosal artery with small residual, nonocclusive filling defect well maintained brisk anterograde flow. Persistent occlusion of the distal left M3/MCA segment was seen. Using biplane roadmap guidance, a Socrates 38 aspiration catheter was navigated over Colossus 35 guidewire into the left M2/MCA. The aspiration catheter was then advanced over the wire to the level of occlusion in the M3 segment and connected to an aspiration pump. Continuous aspiration was performed for 2 minutes. The guide catheter was connected to a VacLok syringe. The aspiration catheter was subsequently removed under constant aspiration. The guide catheter was aspirated for debris. Left internal carotid artery angiograms with frontal and lateral views of the head showed recanalization of the M3 segment with slow distal flow. However, active contrast extravasation was noted at the M3 level. Using biplane roadmap guidance, a phenom 21 microcatheter was navigated over an Aristotle 14 micro guidewire into  the left M2/MCA posterior division branch and then into the M3 segment, at the level of bursae contrast extravasation. Then, a detachable coils were placed at the level of contrast extravasation (2 mm x 6 cm target XL 360 soft x2). Control angiograms were obtained. Then, the microcatheter was prepped with dextrose 5%. Subsequently infusion of n-BCA was performed within the recently deployed coil mass. The microcatheter was then removed under constant aspiration. Left internal carotid artery angiograms with frontal and lateral views of the head showed distal occlusion of the left M3/MCA posterior division branch with complete resolution of contrast extravasation. Flat panel CT of the head was obtained and post processed in a separate workstation with concurrent attending physician supervision. Selected images were sent to PACS. Moderate hyperdensity in the left sylvian fissure was noted, consistent with contrast extravasation. Delayed left internal carotid artery angiograms with frontal and lateral views of the head showed stable findings with persistent patency of the left pericallosal artery, occlusion of the distal left M3/MCA at the level of the coil mass with no evidence of contrast extravasation. The catheter was subsequently withdrawn. Right common femoral artery angiogram was obtained in right anterior oblique view. The puncture is at the level of the common femoral artery. The artery has normal caliber, adequate for closure device.  The sheath was exchanged over the wire for a Perclose prostyle which was utilized for access closure. Immediate hemostasis was achieved. IMPRESSION: 1. Mechanical thrombectomy performed for treatment of a left pericallosal and left M3/MCA occlusion. 2. Recanalization of the left pericallosal artery achieved (TICI 3). 3. Recanalization of the left M3/MCA with slow flow, complicated by contrast extravasation requiring occlusion of the distal M3 branch with coils and n-BCA. Final  left MCA patency status is a TICI 2B. PLAN: TRansfer to ICU. Electronically Signed   By: Baldemar Lenis M.D.   On: 02/11/2023 11:03   IR Transcath/Emboliz  Result Date: 02/11/2023 INDICATION: Kristen Parks is an 82 year old female presenting with left-gaze preference, aphasia, dysarthria, right-sided weakness; NIHSS 19. Her last known well was 10 p.m. on 02/06/2023. Her past medical history significant for hypertension, dyslipidemia, anemia, hypothyroidism, Left corpus collosum infarction in 2019, chronic small vessel disease; baseline modified Rankin scale 3. Head CT showed no evidence of hemorrhage. On my personal review, hypodensity in the posterior left ACA and posterior left MCA territories was seen (MCA - ASPECTS 6). No IV thrombolytic was administered as patient was outside the window. CT angiogram of the head and neck showed an occlusion of the distal left pericallosal artery (A4 segment) in the narrowing of the left M2/MCA posterior division branch, concerning for possible nonocclusive embolus versus stenosis. Findings discussed with patient's daughter including risks and potential benefits of intervention. Decision made to proceed with mechanical thrombectomy. EXAM: ULTRASOUND-GUIDED VASCULAR ACCESS DIAGNOSTIC CEREBRAL ANGIOGRAM MECHANICAL THROMBECTOMY ENDOVASCULAR VESSEL EMBOLIZATION FLAT PANEL HEAD CT COMPARISON:  CT/CT angiogram of the head and neck February 07, 2023. MEDICATIONS: Ancef 2 g IV. ANESTHESIA/SEDATION: The procedure was performed under general anesthesia. CONTRAST:  80 mL of Omnipaque 300 milligram/mL FLUOROSCOPY: Radiation Exposure Index (as provided by the fluoroscopic device): 982 mGy Kerma COMPLICATIONS: SIR LEVEL C - Requires therapy, minor hospitalization (<48 hrs). TECHNIQUE: Informed written consent was obtained from the patient's daughter after a thorough discussion of the procedural risks, benefits and alternatives. All questions were addressed. Maximal Sterile  Barrier Technique was utilized including caps, mask, sterile gowns, sterile gloves, sterile drape, hand hygiene and skin antiseptic. A timeout was performed prior to the initiation of the procedure. The right groin was prepped and draped in the usual sterile fashion. Using a micropuncture kit and the modified Seldinger technique, access was gained to the right common femoral artery and an 8 French sheath was placed. Real-time ultrasound guidance was utilized for vascular access including the acquisition of a permanent ultrasound image documenting patency of the accessed vessel. Under fluoroscopy, a Zoom 88 guide catheter was navigated over a 6 [place] VTK catheter and a 0.035" Terumo Glidewire into the aortic arch. The catheter was placed into the left common carotid artery. The VTK catheter was removed. Using biplane roadmap guidance, the a was advanced over the wire into the mid cervical segment of the left ICA. Frontal and lateral angiograms of the head were obtained. FINDINGS: 1. Mild atherosclerotic changes of the right common femoral artery without hemodynamically significant stenosis. 2. Increased tortuosity of the cervical segment of the left ICA. No significant atherosclerotic disease or stenosis at the left carotid bifurcation. Occlusion of the distal right pericallosal artery at the A4 segment. 3. Occlusion of a left M3/MCA posterior region branch. 4. Increased and early capillary blush in the left posterior MCA territory in the parietal region with early venous drainage, suggestive of ongoing ischemia. 5. No left M2/MCA occlusion or significant stenosis. PROCEDURE:  Using biplane roadmap guidance, a Socrates 38 aspiration catheter was navigated over Colossus 35 guidewire into the left A1/ACA. The aspiration catheter was then advanced over the wire to the level of occlusion in the A4 segment and connected to an aspiration pump. Continuous aspiration was performed for 2 minutes. The guide catheter was  connected to a VacLok syringe. The aspiration catheter was subsequently removed under constant aspiration. The guide catheter was aspirated for debris. Left internal carotid artery angiograms with frontal and lateral views of the head showed recanalization of the left pericallosal artery with small residual, nonocclusive filling defect well maintained brisk anterograde flow. Persistent occlusion of the distal left M3/MCA segment was seen. Using biplane roadmap guidance, a Socrates 38 aspiration catheter was navigated over Colossus 35 guidewire into the left M2/MCA. The aspiration catheter was then advanced over the wire to the level of occlusion in the M3 segment and connected to an aspiration pump. Continuous aspiration was performed for 2 minutes. The guide catheter was connected to a VacLok syringe. The aspiration catheter was subsequently removed under constant aspiration. The guide catheter was aspirated for debris. Left internal carotid artery angiograms with frontal and lateral views of the head showed recanalization of the M3 segment with slow distal flow. However, active contrast extravasation was noted at the M3 level. Using biplane roadmap guidance, a phenom 21 microcatheter was navigated over an Aristotle 14 micro guidewire into the left M2/MCA posterior division branch and then into the M3 segment, at the level of bursae contrast extravasation. Then, a detachable coils were placed at the level of contrast extravasation (2 mm x 6 cm target XL 360 soft x2). Control angiograms were obtained. Then, the microcatheter was prepped with dextrose 5%. Subsequently infusion of n-BCA was performed within the recently deployed coil mass. The microcatheter was then removed under constant aspiration. Left internal carotid artery angiograms with frontal and lateral views of the head showed distal occlusion of the left M3/MCA posterior division branch with complete resolution of contrast extravasation. Flat panel CT of  the head was obtained and post processed in a separate workstation with concurrent attending physician supervision. Selected images were sent to PACS. Moderate hyperdensity in the left sylvian fissure was noted, consistent with contrast extravasation. Delayed left internal carotid artery angiograms with frontal and lateral views of the head showed stable findings with persistent patency of the left pericallosal artery, occlusion of the distal left M3/MCA at the level of the coil mass with no evidence of contrast extravasation. The catheter was subsequently withdrawn. Right common femoral artery angiogram was obtained in right anterior oblique view. The puncture is at the level of the common femoral artery. The artery has normal caliber, adequate for closure device. The sheath was exchanged over the wire for a Perclose prostyle which was utilized for access closure. Immediate hemostasis was achieved. IMPRESSION: 1. Mechanical thrombectomy performed for treatment of a left pericallosal and left M3/MCA occlusion. 2. Recanalization of the left pericallosal artery achieved (TICI 3). 3. Recanalization of the left M3/MCA with slow flow, complicated by contrast extravasation requiring occlusion of the distal M3 branch with coils and n-BCA. Final left MCA patency status is a TICI 2B. PLAN: TRansfer to ICU. Electronically Signed   By: Baldemar Lenis M.D.   On: 02/11/2023 11:03   CT HEAD WO CONTRAST ( )  Result Date: 02/11/2023 INDICATION: Kristen Parks is an 82 year old female presenting with left-gaze preference, aphasia, dysarthria, right-sided weakness; NIHSS 19. Her last known well was 10 p.m.  on 02/06/2023. Her past medical history significant for hypertension, dyslipidemia, anemia, hypothyroidism, Left corpus collosum infarction in 2019, chronic small vessel disease; baseline modified Rankin scale 3. Head CT showed no evidence of hemorrhage. On my personal review, hypodensity in the posterior left  ACA and posterior left MCA territories was seen (MCA - ASPECTS 6). No IV thrombolytic was administered as patient was outside the window. CT angiogram of the head and neck showed an occlusion of the distal left pericallosal artery (A4 segment) in the narrowing of the left M2/MCA posterior division branch, concerning for possible nonocclusive embolus versus stenosis. Findings discussed with patient's daughter including risks and potential benefits of intervention. Decision made to proceed with mechanical thrombectomy. EXAM: ULTRASOUND-GUIDED VASCULAR ACCESS DIAGNOSTIC CEREBRAL ANGIOGRAM MECHANICAL THROMBECTOMY ENDOVASCULAR VESSEL EMBOLIZATION FLAT PANEL HEAD CT COMPARISON:  CT/CT angiogram of the head and neck February 07, 2023. MEDICATIONS: Ancef 2 g IV. ANESTHESIA/SEDATION: The procedure was performed under general anesthesia. CONTRAST:  80 mL of Omnipaque 300 milligram/mL FLUOROSCOPY: Radiation Exposure Index (as provided by the fluoroscopic device): 982 mGy Kerma COMPLICATIONS: SIR LEVEL C - Requires therapy, minor hospitalization (<48 hrs). TECHNIQUE: Informed written consent was obtained from the patient's daughter after a thorough discussion of the procedural risks, benefits and alternatives. All questions were addressed. Maximal Sterile Barrier Technique was utilized including caps, mask, sterile gowns, sterile gloves, sterile drape, hand hygiene and skin antiseptic. A timeout was performed prior to the initiation of the procedure. The right groin was prepped and draped in the usual sterile fashion. Using a micropuncture kit and the modified Seldinger technique, access was gained to the right common femoral artery and an 8 French sheath was placed. Real-time ultrasound guidance was utilized for vascular access including the acquisition of a permanent ultrasound image documenting patency of the accessed vessel. Under fluoroscopy, a Zoom 88 guide catheter was navigated over a 6 [place] VTK catheter and a 0.035"  Terumo Glidewire into the aortic arch. The catheter was placed into the left common carotid artery. The VTK catheter was removed. Using biplane roadmap guidance, the a was advanced over the wire into the mid cervical segment of the left ICA. Frontal and lateral angiograms of the head were obtained. FINDINGS: 1. Mild atherosclerotic changes of the right common femoral artery without hemodynamically significant stenosis. 2. Increased tortuosity of the cervical segment of the left ICA. No significant atherosclerotic disease or stenosis at the left carotid bifurcation. Occlusion of the distal right pericallosal artery at the A4 segment. 3. Occlusion of a left M3/MCA posterior region branch. 4. Increased and early capillary blush in the left posterior MCA territory in the parietal region with early venous drainage, suggestive of ongoing ischemia. 5. No left M2/MCA occlusion or significant stenosis. PROCEDURE: Using biplane roadmap guidance, a Socrates 38 aspiration catheter was navigated over Colossus 35 guidewire into the left A1/ACA. The aspiration catheter was then advanced over the wire to the level of occlusion in the A4 segment and connected to an aspiration pump. Continuous aspiration was performed for 2 minutes. The guide catheter was connected to a VacLok syringe. The aspiration catheter was subsequently removed under constant aspiration. The guide catheter was aspirated for debris. Left internal carotid artery angiograms with frontal and lateral views of the head showed recanalization of the left pericallosal artery with small residual, nonocclusive filling defect well maintained brisk anterograde flow. Persistent occlusion of the distal left M3/MCA segment was seen. Using biplane roadmap guidance, a Socrates 38 aspiration catheter was navigated over Colossus 35 guidewire into  the left M2/MCA. The aspiration catheter was then advanced over the wire to the level of occlusion in the M3 segment and connected to an  aspiration pump. Continuous aspiration was performed for 2 minutes. The guide catheter was connected to a VacLok syringe. The aspiration catheter was subsequently removed under constant aspiration. The guide catheter was aspirated for debris. Left internal carotid artery angiograms with frontal and lateral views of the head showed recanalization of the M3 segment with slow distal flow. However, active contrast extravasation was noted at the M3 level. Using biplane roadmap guidance, a phenom 21 microcatheter was navigated over an Aristotle 14 micro guidewire into the left M2/MCA posterior division branch and then into the M3 segment, at the level of bursae contrast extravasation. Then, a detachable coils were placed at the level of contrast extravasation (2 mm x 6 cm target XL 360 soft x2). Control angiograms were obtained. Then, the microcatheter was prepped with dextrose 5%. Subsequently infusion of n-BCA was performed within the recently deployed coil mass. The microcatheter was then removed under constant aspiration. Left internal carotid artery angiograms with frontal and lateral views of the head showed distal occlusion of the left M3/MCA posterior division branch with complete resolution of contrast extravasation. Flat panel CT of the head was obtained and post processed in a separate workstation with concurrent attending physician supervision. Selected images were sent to PACS. Moderate hyperdensity in the left sylvian fissure was noted, consistent with contrast extravasation. Delayed left internal carotid artery angiograms with frontal and lateral views of the head showed stable findings with persistent patency of the left pericallosal artery, occlusion of the distal left M3/MCA at the level of the coil mass with no evidence of contrast extravasation. The catheter was subsequently withdrawn. Right common femoral artery angiogram was obtained in right anterior oblique view. The puncture is at the level of the  common femoral artery. The artery has normal caliber, adequate for closure device. The sheath was exchanged over the wire for a Perclose prostyle which was utilized for access closure. Immediate hemostasis was achieved. IMPRESSION: 1. Mechanical thrombectomy performed for treatment of a left pericallosal and left M3/MCA occlusion. 2. Recanalization of the left pericallosal artery achieved (TICI 3). 3. Recanalization of the left M3/MCA with slow flow, complicated by contrast extravasation requiring occlusion of the distal M3 branch with coils and n-BCA. Final left MCA patency status is a TICI 2B. PLAN: TRansfer to ICU. Electronically Signed   By: Baldemar Lenis M.D.   On: 02/11/2023 11:03   VAS [place] LOWER EXTREMITY VENOUS (DVT)  Result Date: 02/10/2023  Lower Venous DVT Study Patient Name:  Kristen Parks  Date of Exam:   02/09/2023 Medical Rec #: [id number]       Accession #:    [id number] Date of Birth: 04-Jan-1941        Patient Gender: F Patient Age:   65 years Exam Location:  [facility] Procedure:      VAS [place] LOWER EXTREMITY VENOUS (DVT) Referring Phys: Scheryl Marten Lynnda Wiersma --------------------------------------------------------------------------------  Indications: Stroke.  Limitations: Bandages, altered mental status, poor hemodynamically positioning. Comparison Study: No prior study on file Performing Technologist: Sherren Kerns RVS  Examination Guidelines: A complete evaluation includes B-mode imaging, spectral Doppler, color Doppler, and power Doppler as needed of all accessible portions of each vessel. Bilateral testing is considered an integral part of a complete examination. Limited examinations for reoccurring indications may be performed as noted. The reflux portion of the exam is performed with  the patient in reverse Trendelenburg.  +---------+---------------+---------+-----------+----------+-------------------+ RIGHT    CompressibilityPhasicitySpontaneityPropertiesThrombus Aging       +---------+---------------+---------+-----------+----------+-------------------+ CFV      Full           Yes      Yes                                      +---------+---------------+---------+-----------+----------+-------------------+ SFJ      Full                                                             +---------+---------------+---------+-----------+----------+-------------------+ FV Prox  Full                                                             +---------+---------------+---------+-----------+----------+-------------------+ FV Mid   Full                                                             +---------+---------------+---------+-----------+----------+-------------------+ FV DistalFull                                                             +---------+---------------+---------+-----------+----------+-------------------+ PFV      Full                                                             +---------+---------------+---------+-----------+----------+-------------------+ POP      Full           Yes      No                                       +---------+---------------+---------+-----------+----------+-------------------+ PTV      Full                                                             +---------+---------------+---------+-----------+----------+-------------------+ PERO                                                  Not well visualized +---------+---------------+---------+-----------+----------+-------------------+   +---------+---------------+---------+-----------+----------+-------------------+  LEFT     CompressibilityPhasicitySpontaneityPropertiesThrombus Aging      +---------+---------------+---------+-----------+----------+-------------------+ CFV      Full           Yes      Yes                                       +---------+---------------+---------+-----------+----------+-------------------+ SFJ      Full                                                             +---------+---------------+---------+-----------+----------+-------------------+ FV Prox  Full                                                             +---------+---------------+---------+-----------+----------+-------------------+ FV Mid   Full                                                             +---------+---------------+---------+-----------+----------+-------------------+ FV DistalFull                                                             +---------+---------------+---------+-----------+----------+-------------------+ PFV      Full                                                             +---------+---------------+---------+-----------+----------+-------------------+ POP                     No       Yes                  patent by color and                                                       Doppler             +---------+---------------+---------+-----------+----------+-------------------+ PTV      Full                                                             +---------+---------------+---------+-----------+----------+-------------------+ PERO  Not well visualized +---------+---------------+---------+-----------+----------+-------------------+     *See table(s) above for measurements and observations. Electronically signed by Sherald Hess MD on 02/10/2023 at 11:13:31 AM.    Final    ECHOCARDIOGRAM COMPLETE  Result Date: 02/08/2023    ECHOCARDIOGRAM REPORT   Patient Name:   Kristen Parks Date of Exam: 02/08/2023 Medical Rec #:  [id number]      Height:       62.0 in Accession #:    [id number]     Weight:       109.8 lb Date of Birth:  [birth year]       BSA:          1.482 m Patient Age:    82 years       BP:            132/77 mmHg Patient Gender: F              HR:           76 bpm. Exam Location:  Inpatient Procedure: 2D Echo, Cardiac Doppler and Color Doppler Indications:    Stroke 434.91/I163.9  History:        Patient has prior history of Echocardiogram examinations, most                 recent 04/15/2018. Stroke; Risk Factors:Hypertension and                 Dyslipidemia. Migraine.  Sonographer:    Lucendia Herrlich RCS Referring Phys: Amada Jupiter, MCNEILL, P  Sonographer Comments: Image acquisition challenging due to patient body habitus. IMPRESSIONS  1. Left ventricular ejection fraction, by estimation, is 55 to 60%. The left ventricle has normal function. The left ventricle has no regional wall motion abnormalities. Left ventricular diastolic parameters are consistent with Grade I diastolic dysfunction (impaired relaxation).  2. Right ventricular systolic function is normal. The right ventricular size is normal. Tricuspid regurgitation signal is inadequate for assessing PA pressure. The estimated right ventricular systolic pressure is 18.5 mmHg.  3. The mitral valve is grossly normal. No evidence of mitral valve regurgitation. No evidence of mitral stenosis.  4. The aortic valve is tricuspid. Aortic valve regurgitation is not visualized. No aortic stenosis is present.  5. The inferior vena cava is normal in size with greater than 50% respiratory variability, suggesting right atrial pressure of 3 mmHg. Comparison(s): No significant change from prior study. FINDINGS  Left Ventricle: Left ventricular ejection fraction, by estimation, is 55 to 60%. The left ventricle has normal function. The left ventricle has no regional wall motion abnormalities. The left ventricular internal cavity size was normal in size. There is  no left ventricular hypertrophy. Left ventricular diastolic function could not be evaluated due to nondiagnostic images. Left ventricular diastolic parameters are consistent with Grade I diastolic dysfunction  (impaired relaxation). Right Ventricle: The right ventricular size is normal. No increase in right ventricular wall thickness. Right ventricular systolic function is normal. Tricuspid regurgitation signal is inadequate for assessing PA pressure. The tricuspid regurgitant velocity is 1.97 m/s, and with an assumed right atrial pressure of 3 mmHg, the estimated right ventricular systolic pressure is 18.5 mmHg. Left Atrium: Left atrial size was normal in size. Right Atrium: Right atrial size was normal in size. Pericardium: There is no evidence of pericardial effusion. Mitral Valve: The mitral valve is grossly normal. No evidence of mitral valve regurgitation. No evidence of mitral valve stenosis. Tricuspid Valve: The tricuspid valve is grossly normal. Tricuspid valve regurgitation is trivial.  No evidence of tricuspid stenosis. Aortic Valve: The aortic valve is tricuspid. Aortic valve regurgitation is not visualized. No aortic stenosis is present. Aortic valve peak gradient measures 6.1 mmHg. Pulmonic Valve: The pulmonic valve was grossly normal. Pulmonic valve regurgitation is not visualized. No evidence of pulmonic stenosis. Aorta: The aortic root and ascending aorta are structurally normal, with no evidence of dilitation. Venous: The inferior vena cava is normal in size with greater than 50% respiratory variability, suggesting right atrial pressure of 3 mmHg. IAS/Shunts: The atrial septum is grossly normal.  LEFT VENTRICLE PLAX 2D LVIDd:         3.30 cm   Diastology LVIDs:         2.10 cm   LV e' medial:    4.62 cm/s LV PW:         0.90 cm   LV E/e' medial:  10.6 LV IVS:        0.90 cm   LV e' lateral:   9.01 cm/s LVOT diam:     2.00 cm   LV E/e' lateral: 5.4 LV SV:         41 LV SV Index:   28 LVOT Area:     3.14 cm  RIGHT VENTRICLE             IVC RV S prime:     14.90 cm/s  IVC diam: 1.80 cm TAPSE (M-mode): 1.8 cm LEFT ATRIUM           Index LA diam:      2.40 cm 1.62 cm/m LA Vol (A2C): 9.8 ml  6.59 ml/m   AORTIC VALVE AV Area (Vmax): 1.77 cm AV Vmax:        123.00 cm/s AV Peak Grad:   6.1 mmHg LVOT Vmax:      69.22 cm/s LVOT Vmean:     43.400 cm/s LVOT VTI:       0.130 m  AORTA Ao Root diam: 2.80 cm Ao Asc diam:  3.10 cm MITRAL VALVE               TRICUSPID VALVE MV Area (PHT): 4.60 cm    TR Peak grad:   15.5 mmHg MV Decel Time: 165 msec    TR Vmax:        197.00 cm/s MV E velocity: 49.00 cm/s MV A velocity: 67.40 cm/s  SHUNTS MV E/A ratio:  0.73        Systemic VTI:  0.13 m                            Systemic Diam: 2.00 cm Lennie Odor MD Electronically signed by Lennie Odor MD Signature Date/Time: 02/08/2023/9:48:49 AM    Final    CT HEAD WO CONTRAST ( )  Result Date: 02/08/2023 CLINICAL DATA:  82 year old female code stroke presentation yesterday. Left MCA abnormality, left ACA and MCA branch occlusions and endovascular reperfusion. Posterior left MCA branch extravasation and subsequent blue and coil embolization. EXAM: CT HEAD WITHOUT CONTRAST TECHNIQUE: Contiguous axial images were obtained from the base of the skull through the vertex without intravenous contrast. RADIATION DOSE REDUCTION: This exam was performed according to the departmental dose-optimization program which includes automated exposure control, adjustment of the mA and/or kV according to patient size and/or use of iterative reconstruction technique. COMPARISON:  Head CT 1039 hours yesterday. FINDINGS: Brain: Small volume subarachnoid hemorrhage in the left sylvian fissure primarily. No intraventricular blood. No ventriculomegaly. No midline  shift. Left MCA cytotoxic edema most apparent in the posterior division, lateral perirolandic area series 3, image 18. Additional confluent cytotoxic edema at the posterior ACA/MCA watershed, left vertex and parietal lobe series 3, image 23. Elsewhere gray-white differentiation appears stable including advanced chronic white matter hypodensity, chronic left cerebellar infarcts. No other acute  intracranial hemorrhage identified. Basilar cisterns remain patent. Vascular: Posterior left MCA region embolization sequelae. Skull: Stable, intact. Sinuses/Orbits: Visualized paranasal sinuses and mastoids are stable and well aerated. Other: Left nasoenteric tube now in place. No acute orbit or scalp soft tissue finding. Left gaze persists. IMPRESSION: 1. Cytotoxic edema in the posterior left MCA and left distal ACA/MCA watershed area. Sequelae of posterior left MCA branch embolization. 2. Small volume acute subarachnoid hemorrhage, primarily in the left sylvian fissure. Heidelberg classification 3c: Subarachnoid hemorrhage. 3. No midline shift or significant intracranial mass effect. No IVH or ventriculomegaly. 4. Underlying chronic ischemia. Electronically Signed   By: Odessa Fleming M.D.   On: 02/08/2023 05:22   MR BRAIN WO CONTRAST  Result Date: 02/07/2023 CLINICAL DATA:  MCA branch occlusion status post thrombectomy EXAM: MRI HEAD WITHOUT CONTRAST TECHNIQUE: Multiplanar, multiecho pulse sequences of the brain and surrounding structures were obtained without intravenous contrast. COMPARISON:  Same-day CT/CTA head and neck FINDINGS: Brain: There is moderate volume patchy diffusion restriction in the left cerebral hemisphere involving the caudate head, corona radiata, insula, parietal lobe, and occipital lobe primarily in the MCA and MCA watershed distributions. There is petechial hemorrhage in the parasagittal parietal lobe (12-45). There is FLAIR signal abnormality and SWI signal dropout in the basal cisterns extending to the left MCA cistern, sylvian fissure, and over the parietal lobe likely reflecting a combination of subarachnoid hemorrhage and contrast extravasation. There is no significant mass effect or midline shift. Background parenchymal volume is within expected limits for age. There is advanced background chronic small-vessel ischemic change with remote infarcts in the bilateral thalami and left  cerebellar hemisphere. The pituitary and suprasellar region are grossly unremarkable. Vascular: The major flow voids are present. Skull and upper cervical spine: There is no definite marrow signal abnormality. Sinuses/Orbits: The paranasal sinuses are clear. Bilateral lens implants are in place. The globes and orbits are otherwise unremarkable. Other: The mastoid air cells and middle ear cavities are clear. IMPRESSION: 1. Moderate volume patchy acute infarcts in the left cerebral hemisphere primarily in the MCA and MCA watershed distributions with petechial hemorrhage in the parietal lobe but no significant mass effect or midline shift. 2. Probable combination of extravasated contrast and subarachnoid hemorrhage in the basal cisterns and over the left cerebral hemisphere. Recommend attention on subsequent head CTs. Electronically Signed   By: Lesia Hausen M.D.   On: 02/07/2023 18:30   DG Abd Portable 1V  Result Date: 02/07/2023 CLINICAL DATA:  Nasoenteric feeding tube placement EXAM: PORTABLE ABDOMEN - 1 VIEW COMPARISON:  None Available. FINDINGS: Nasoenteric feeding tube tip overlies the expected distal body of the stomach. The abdominal gas pattern is nonspecific due to a paucity of intra-abdominal gas. Cholecystectomy clips are seen in the right upper quadrant. Contrast is seen within nondilated renal collecting systems bilaterally. IMPRESSION: 1. Nasoenteric feeding tube tip within the distal body of the stomach. Electronically Signed   By: Helyn Numbers M.D.   On: 02/07/2023 17:59   CT ANGIO HEAD NECK W WO CM W PERF (CODE STROKE)  Result Date: 02/07/2023 CLINICAL DATA:  Neuro deficit, acute, stroke suspected. Left-sided visual preference and slurred speech. EXAM: CT ANGIOGRAPHY HEAD  AND NECK CT PERFUSION BRAIN TECHNIQUE: Multidetector CT imaging of the head and neck was performed using the standard protocol during bolus administration of intravenous contrast. Multiplanar CT image reconstructions and  MIPs were obtained to evaluate the vascular anatomy. Carotid stenosis measurements (when applicable) are obtained utilizing NASCET criteria, using the distal internal carotid diameter as the denominator. Multiphase CT imaging of the brain was performed following IV bolus contrast injection. Subsequent parametric perfusion maps were calculated using RAPID software. RADIATION DOSE REDUCTION: This exam was performed according to the departmental dose-optimization program which includes automated exposure control, adjustment of the mA and/or kV according to patient size and/or use of iterative reconstruction technique. CONTRAST:  OMNIPAQUE IOHEXOL 350 MG/ML SOLN COMPARISON:  Head CT 02/07/2023. CTA neck 04/15/2018. MRI/MRA head 04/14/2018. FINDINGS: CTA NECK FINDINGS Aortic arch: Three-vessel arch configuration. Arch vessel origins are patent. Right carotid system: No evidence of dissection, stenosis (50% or greater) or occlusion. Left carotid system: No evidence of dissection, stenosis (50% or greater) or occlusion. Vertebral arteries: Left dominant. No evidence of dissection, stenosis (50% or greater) or occlusion. Skeleton: Mild cervical spondylosis without high-grade spinal canal stenosis. 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 Upper chest: 11 mm solid nodule in the superior segment of the left lower lobe (axial image 186 series 5). Review of the MIP images confirms the above findings CTA HEAD FINDINGS Anterior circulation: Intracranial ICAs are patent without stenosis or aneurysm. Proximal high-grade stenosis versus subocclusive embolus of the left MCA superior M2 division with distal reconstitution (axial images 98-103 series 8), new from the prior MRA. Distal branches are symmetric. Posterior circulation: Normal basilar artery. The SCAs, AICAs and PICAs are patent proximally.  The PCAs are patent proximally without stenosis or aneurysm. Distal branches are symmetric. Venous sinuses: Early phase of contrast. Anatomic variants: Hypoplastic right A1 segment. Azygous A2 segment. Persistent fetal origin of the right PCA with hypoplastic right P1 segment. Review of the MIP images confirms the above findings CT Brain Perfusion Findings: CBF (<30%) Volume: 12mL estimated by automated post processing. However, given significant motion during the first pass arterial bolus, this may be an overestimate. Perfusion (Tmax>6.0s) volume: 72mL estimated by automated post processing. However, given significant motion during the first pass arterial bolus, this may be an overestimate. Mismatch Volume: 60mL Infarction Location:Left frontoparietal junction, MCA territory. IMPRESSION: 1. Proximal high-grade stenosis versus subocclusive embolus of the left MCA superior M2 division with distal reconstitution, new from the prior MRA. 2. CT perfusion demonstrates a 12 mL core infarct in the left frontoparietal junction, MCA territory. However, given significant motion during the first pass arterial bolus, this may be an overestimate. 3. No hemodynamically significant stenosis in the neck. 4. 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 5. A 11 mm solid nodule in the superior segment of the left lower lobe. Consider one of the following in 3 months for both low-risk and high-risk individuals: (a) repeat chest CT, (b) follow-up PET-CT, or (c) tissue sampling. This recommendation follows the consensus statement: Guidelines for Management of Incidental Pulmonary Nodules Detected on CT Images: From the Fleischner Society 2017; Radiology 2017; 284:228-243. Code stroke imaging results were communicated on 02/07/2023 at 11:17 am to provider Dr. Amada Jupiter via telephone, who verbally acknowledged these results. Electronically Signed    By: Elwyn Reach.D.  On: 02/07/2023 11:32   CT HEAD CODE STROKE WO CONTRAST  Result Date: 02/07/2023 CLINICAL DATA:  Code stroke. Neuro deficit, acute, stroke suspected. Left visual preference and slurred speech. EXAM: CT HEAD WITHOUT CONTRAST TECHNIQUE: Contiguous axial images were obtained from the base of the skull through the vertex without intravenous contrast. RADIATION DOSE REDUCTION: This exam was performed according to the departmental dose-optimization program which includes automated exposure control, adjustment of the mA and/or kV according to patient size and/or use of iterative reconstruction technique. COMPARISON:  Head CT 04/28/2021. FINDINGS: Brain: No acute hemorrhage. Possible new loss of gray-white differentiation along the posterior aspect of the left subfrontal gyrus (sagittal image 41 series 6). Background of severe chronic small-vessel disease with old infarct along the left middle frontal gyrus and old perforator infarcts in the bilateral thalami and left cerebellar hemisphere. No hydrocephalus or extra-axial collection. No mass effect or midline shift. Vascular: No hyperdense vessel or unexpected calcification. Skull: No calvarial fracture or suspicious bone lesion. Skull base is unremarkable. Sinuses/Orbits: No acute finding. Other: None. ASPECTS [facility] Stroke Program Early CT Score) - Ganglionic level infarction (caudate, lentiform nuclei, internal capsule, insula, M1-M3 cortex): 7 - Supraganglionic infarction (M4-M6 cortex): 2 Total score (0-10 with 10 being normal): 9 IMPRESSION: 1. Possible new loss of gray-white differentiation along the posterior aspect of the left subfrontal gyrus, which could represent an acute infarct. No acute hemorrhage. 2. Background of severe chronic small-vessel disease with old infarct along the left middle frontal gyrus and old perforator infarcts in the bilateral thalami and left cerebellar hemisphere. Code stroke imaging results were  communicated on 02/07/2023 at 10:47 am to provider Dr. Amada Jupiter via secure text paging. Electronically Signed   By: Orvan Falconer M.D.   On: 02/07/2023 10:47     PHYSICAL EXAM  Temp:  [97.6 F (36.4 C)-98.2 F (36.8 C)] 97.6 F (36.4 C) (10/31 1227) Pulse Rate:  [78-88] 88 (10/31 1227) Resp:  [16] 16 (10/31 1227) BP: (118-128)/(67-75) 122/67 (10/31 1227) SpO2:  [98 %-100 %] 100 % (10/31 1227) Weight:  [51.9 kg] 51.9 kg (10/31 0458)  General - Well nourished, well developed, in no apparent distress. Cardiovascular - Regular rhythm and rate.  Neuro - awake, alert sitting up in the bed. Pt eyes open, global aphasia, not following commands, able to have sounds out but not meaningful. Left gaze preference, does cross midline, tracks,  right hemianopia vs. Visual neglect, no blink to threat on the right, PERRL. Right facial droop. Tongue protrusion not cooperative. LUE at least 3+/5 and LLE at least 3/5, RUE spontaneous, purposeful movement against gravity, RLE withdraws  ASSESSMENT/PLAN Ms. JURLEAN BICKELL is a 82 y.o. female with history of hypertension, hyperlipidemia, stroke in 2019 admitted for slurred speech, aphasia, left gaze and right-sided weakness. No tPA given due to outside window.    Stroke:  left MCA and ACA infarcts s/p IR with TICI3 on L A4 and TICI2c on L M3 complicated by extravasation at left M3 s/p coil embolization, embolic pattern, likely secondary to cardioembolic source CT head no acute infarct, questionable left frontal early sign of stroke CT head and neck left M2 high-grade stenosis versus near occlusion CTP 12/72 Status post IR with left A4 occlusion with TICI3 reperfusion, left M3 occlusion with TICI2c complicated by extravasation status post coil embolization MRI left MCA and ACA infarcts with hemorrhagic transformation and questionable SAH CT 12/26 left MCA and left distal ACA/MCA watershed area infarcts.  Small volume acute SAH, primary in the  left sylvian  fissure CT repeat 10/29 stable 2D Echo EF 55 to 60% LE venous Doppler no DVT Will recommend 30 day cardiac monitor as outpatient to rule out A-fib.  If negative, may consider loop recorder after if significant neurological improvement. LDL 101 HgbA1c 5.2 Heparin subcu for VTE prophylaxis aspirin 81 mg daily prior to admission, now on ASA 81mg  Ongoing aggressive stroke risk factor management Therapy recommendations:  SNF Disposition: Pending  History of stroke 03/2018 admitted for small left CC and cingulate gyrus infarct left ACA territory.  MRA head negative.  CTA neck on markable.  LDL 39, A1c 5.1.  EF 60 to 60%.  Had anemia with PRBC transfusion.  Discharged on aspirin and Zocor 20. Outpatient 30-day CardioNet monitor negative for A-fib.  Hypertension Stable BP goal less than 160 given SAH Long term BP goal normotensive  Hyperlipidemia Home meds: None LDL 101, goal < 70 Now on Crestor 20 Continue statin at discharge  Dysphagia Now on Dys 2 diet with thin liquid P.o. intake improved, will DC tube feeding and core Trak Encourage p.o. intake dietitian consulted, appreciate assistance  Other Stroke Risk Factors Advanced age  Other Active Problems Hypophosphatemia Phos 2.1 replaced  Hospital day # 6  Gevena Mart DNP, ACNPC-AG  [facility]  ATTENDING NOTE: I reviewed above note and agree with the assessment and plan. Pt was seen and examined.   Daughter and granddaughter are at bedside.  Patient lying in bed, awake alert, however still global aphasia with right neglect and right hemiparesis.  Reported increased p.o. intake yesterday and this morning, dietitian on board, agree with DC core track tube and tube feeding.  Continue to encourage p.o. intake.  On aspirin and statin.  Pending SNF.  For detailed assessment and plan, please refer to above/below as I have made changes wherever appropriate.   Marvel Plan, MD PhD Stroke Neurology 02/13/2023 5:29  PM

## 2023-02-13 NOTE — Progress Notes (Signed)
Calorie Count Note  24-hour calorie count ordered.  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 Ongoing.    GOAL:  Patient will meet greater than or equal to 90% of their needs Progressing, TF and diet in place  INTERVENTION:  Remove cor trak tube and discontinue tube feeds MVI daily  Continue current diet as ordered Encourage PO intake at meals Feeding assistance Family to order meals when present Continue ice cream and peaches to all trays Nectar Thick Mighty Shake TID, each supplement provides 330kcal and 9g protein     Greig Castilla, RD, LDN Clinical Dietitian RD pager # available in [facility]  After hours/weekend pager # available in [facility]

## 2023-02-13 NOTE — Plan of Care (Signed)
Problem: Education: Goal: Knowledge of disease or condition will improve Outcome: Progressing Goal: Knowledge of secondary prevention will improve (MUST DOCUMENT ALL) Outcome: Progressing Goal: Knowledge of patient specific risk factors will improve Loraine Leriche N/A or DELETE if not current risk factor) Outcome: Progressing   Problem: Ischemic Stroke/TIA Tissue Perfusion: Goal: Complications of ischemic stroke/TIA will be minimized Outcome: Progressing   Problem: Coping: Goal: Will verbalize positive feelings about self Outcome: Progressing Goal: Will identify appropriate support needs Outcome: Progressing   Problem: Health Behavior/Discharge Planning: Goal: Ability to manage health-related needs will improve Outcome: Progressing Goal: Goals will be collaboratively established with patient/family Outcome: Progressing   Problem: Self-Care: Goal: Ability to participate in self-care as condition permits will improve Outcome: Progressing Goal: Verbalization of feelings and concerns over difficulty with self-care will improve Outcome: Progressing Goal: Ability to communicate needs accurately will improve Outcome: Progressing   Problem: Nutrition: Goal: Risk of aspiration will decrease Outcome: Progressing Goal: Dietary intake will improve Outcome: Progressing   Problem: Education: Goal: Understanding of CV disease, CV risk reduction, and recovery process will improve Outcome: Progressing Goal: Individualized Educational Video(s) Outcome: Progressing   Problem: Activity: Goal: Ability to return to baseline activity level will improve Outcome: Progressing   Problem: Cardiovascular: Goal: Ability to achieve and maintain adequate cardiovascular perfusion will improve Outcome: Progressing Goal: Vascular access site(s) Level 0-1 will be maintained Outcome: Progressing   Problem: Health Behavior/Discharge Planning: Goal: Ability to safely manage health-related needs after  discharge will improve Outcome: Progressing   Problem: Education: Goal: Ability to describe self-care measures that may prevent or decrease complications (Diabetes Survival Skills Education) will improve Outcome: Progressing Goal: Individualized Educational Video(s) Outcome: Progressing   Problem: Coping: Goal: Ability to adjust to condition or change in health will improve Outcome: Progressing   Problem: Fluid Volume: Goal: Ability to maintain a balanced intake and output will improve Outcome: Progressing   Problem: Health Behavior/Discharge Planning: Goal: Ability to identify and utilize available resources and services will improve Outcome: Progressing Goal: Ability to manage health-related needs will improve Outcome: Progressing   Problem: Metabolic: Goal: Ability to maintain appropriate glucose levels will improve Outcome: Progressing   Problem: Nutritional: Goal: Maintenance of adequate nutrition will improve Outcome: Progressing Goal: Progress toward achieving an optimal weight will improve Outcome: Progressing   Problem: Skin Integrity: Goal: Risk for impaired skin integrity will decrease Outcome: Progressing   Problem: Tissue Perfusion: Goal: Adequacy of tissue perfusion will improve Outcome: Progressing   Problem: Education: Goal: Knowledge of General Education information will improve Description: Including pain rating scale, medication(s)/side effects and non-pharmacologic comfort measures Outcome: Progressing   Problem: Health Behavior/Discharge Planning: Goal: Ability to manage health-related needs will improve Outcome: Progressing   Problem: Clinical Measurements: Goal: Ability to maintain clinical measurements within normal limits will improve Outcome: Progressing Goal: Will remain free from infection Outcome: Progressing Goal: Diagnostic test results will improve Outcome: Progressing Goal: Respiratory complications will improve Outcome:  Progressing Goal: Cardiovascular complication will be avoided Outcome: Progressing   Problem: Activity: Goal: Risk for activity intolerance will decrease Outcome: Progressing   Problem: Nutrition: Goal: Adequate nutrition will be maintained Outcome: Progressing   Problem: Coping: Goal: Level of anxiety will decrease Outcome: Progressing   Problem: Elimination: Goal: Will not experience complications related to bowel motility Outcome: Progressing Goal: Will not experience complications related to urinary retention Outcome: Progressing   Problem: Pain Management: Goal: General experience of comfort will improve Outcome: Progressing   Problem: Safety: Goal: Ability to remain free from injury  will improve Outcome: Progressing   Problem: Skin Integrity: Goal: Risk for impaired skin integrity will decrease Outcome: Progressing

## 2023-02-13 NOTE — Plan of Care (Signed)
Problem: Education: Goal: Knowledge of disease or condition will improve 02/13/2023 1802 by Asencion Gowda, RN Outcome: Progressing 02/13/2023 1733 by Asencion Gowda, RN Outcome: Progressing Goal: Knowledge of secondary prevention will improve (MUST DOCUMENT ALL) 02/13/2023 1802 by Asencion Gowda, RN Outcome: Progressing 02/13/2023 1733 by Asencion Gowda, RN Outcome: Progressing Goal: Knowledge of patient specific risk factors will improve Loraine Leriche N/A or DELETE if not current risk factor) 02/13/2023 1802 by Asencion Gowda, RN Outcome: Progressing 02/13/2023 1733 by Asencion Gowda, RN Outcome: Progressing   Problem: Ischemic Stroke/TIA Tissue Perfusion: Goal: Complications of ischemic stroke/TIA will be minimized 02/13/2023 1802 by Asencion Gowda, RN Outcome: Progressing 02/13/2023 1733 by Asencion Gowda, RN Outcome: Progressing   Problem: Coping: Goal: Will verbalize positive feelings about self 02/13/2023 1802 by Asencion Gowda, RN Outcome: Progressing 02/13/2023 1733 by Asencion Gowda, RN Outcome: Progressing Goal: Will identify appropriate support needs 02/13/2023 1802 by Asencion Gowda, RN Outcome: Progressing 02/13/2023 1733 by Asencion Gowda, RN Outcome: Progressing   Problem: Health Behavior/Discharge Planning: Goal: Ability to manage health-related needs will improve 02/13/2023 1802 by Asencion Gowda, RN Outcome: Progressing 02/13/2023 1733 by Asencion Gowda, RN Outcome: Progressing Goal: Goals will be collaboratively established with patient/family 02/13/2023 1802 by Asencion Gowda, RN Outcome: Progressing 02/13/2023 1733 by Asencion Gowda, RN Outcome: Progressing   Problem: Self-Care: Goal: Ability to participate in self-care as condition permits will improve 02/13/2023 1802 by Asencion Gowda, RN Outcome: Progressing 02/13/2023 1733 by Asencion Gowda, RN Outcome:  Progressing Goal: Verbalization of feelings and concerns over difficulty with self-care will improve 02/13/2023 1802 by Asencion Gowda, RN Outcome: Progressing 02/13/2023 1733 by Asencion Gowda, RN Outcome: Progressing Goal: Ability to communicate needs accurately will improve 02/13/2023 1802 by Asencion Gowda, RN Outcome: Progressing 02/13/2023 1733 by Asencion Gowda, RN Outcome: Progressing   Problem: Nutrition: Goal: Risk of aspiration will decrease 02/13/2023 1802 by Asencion Gowda, RN Outcome: Progressing 02/13/2023 1733 by Asencion Gowda, RN Outcome: Progressing Goal: Dietary intake will improve 02/13/2023 1802 by Asencion Gowda, RN Outcome: Progressing 02/13/2023 1733 by Asencion Gowda, RN Outcome: Progressing   Problem: Education: Goal: Understanding of CV disease, CV risk reduction, and recovery process will improve 02/13/2023 1802 by Asencion Gowda, RN Outcome: Progressing 02/13/2023 1733 by Asencion Gowda, RN Outcome: Progressing Goal: Individualized Educational Video(s) 02/13/2023 1802 by Asencion Gowda, RN Outcome: Progressing 02/13/2023 1733 by Asencion Gowda, RN Outcome: Progressing   Problem: Activity: Goal: Ability to return to baseline activity level will improve 02/13/2023 1802 by Asencion Gowda, RN Outcome: Progressing 02/13/2023 1733 by Asencion Gowda, RN Outcome: Progressing   Problem: Cardiovascular: Goal: Ability to achieve and maintain adequate cardiovascular perfusion will improve 02/13/2023 1802 by Asencion Gowda, RN Outcome: Progressing 02/13/2023 1733 by Asencion Gowda, RN Outcome: Progressing Goal: Vascular access site(s) Level 0-1 will be maintained 02/13/2023 1802 by Asencion Gowda, RN Outcome: Progressing 02/13/2023 1733 by Asencion Gowda, RN Outcome: Progressing   Problem: Health Behavior/Discharge Planning: Goal: Ability to safely manage  health-related needs after discharge will improve 02/13/2023 1802 by Asencion Gowda, RN Outcome: Progressing 02/13/2023 1733 by Asencion Gowda, RN Outcome: Progressing   Problem: Education: Goal: Ability to describe self-care measures that may prevent or decrease complications (Diabetes Survival Skills Education) will improve 02/13/2023 1802 by Asencion Gowda, RN Outcome: Progressing 02/13/2023 1733 by Asencion Gowda, RN  Outcome: Progressing Goal: Individualized Educational Video(s) 02/13/2023 1802 by Asencion Gowda, RN Outcome: Progressing 02/13/2023 1733 by Asencion Gowda, RN Outcome: Progressing   Problem: Coping: Goal: Ability to adjust to condition or change in health will improve 02/13/2023 1802 by Asencion Gowda, RN Outcome: Progressing 02/13/2023 1733 by Asencion Gowda, RN Outcome: Progressing   Problem: Fluid Volume: Goal: Ability to maintain a balanced intake and output will improve 02/13/2023 1802 by Asencion Gowda, RN Outcome: Progressing 02/13/2023 1733 by Asencion Gowda, RN Outcome: Progressing   Problem: Health Behavior/Discharge Planning: Goal: Ability to identify and utilize available resources and services will improve 02/13/2023 1802 by Asencion Gowda, RN Outcome: Progressing 02/13/2023 1733 by Asencion Gowda, RN Outcome: Progressing Goal: Ability to manage health-related needs will improve 02/13/2023 1802 by Asencion Gowda, RN Outcome: Progressing 02/13/2023 1733 by Asencion Gowda, RN Outcome: Progressing   Problem: Metabolic: Goal: Ability to maintain appropriate glucose levels will improve 02/13/2023 1802 by Asencion Gowda, RN Outcome: Progressing 02/13/2023 1733 by Asencion Gowda, RN Outcome: Progressing   Problem: Nutritional: Goal: Maintenance of adequate nutrition will improve 02/13/2023 1802 by Asencion Gowda, RN Outcome: Progressing 02/13/2023 1733 by  Asencion Gowda, RN Outcome: Progressing Goal: Progress toward achieving an optimal weight will improve 02/13/2023 1802 by Asencion Gowda, RN Outcome: Progressing 02/13/2023 1733 by Asencion Gowda, RN Outcome: Progressing   Problem: Skin Integrity: Goal: Risk for impaired skin integrity will decrease 02/13/2023 1802 by Asencion Gowda, RN Outcome: Progressing 02/13/2023 1733 by Asencion Gowda, RN Outcome: Progressing   Problem: Tissue Perfusion: Goal: Adequacy of tissue perfusion will improve 02/13/2023 1802 by Asencion Gowda, RN Outcome: Progressing 02/13/2023 1733 by Asencion Gowda, RN Outcome: Progressing   Problem: Education: Goal: Knowledge of General Education information will improve Description: Including pain rating scale, medication(s)/side effects and non-pharmacologic comfort measures 02/13/2023 1802 by Asencion Gowda, RN Outcome: Progressing 02/13/2023 1733 by Asencion Gowda, RN Outcome: Progressing   Problem: Health Behavior/Discharge Planning: Goal: Ability to manage health-related needs will improve 02/13/2023 1802 by Asencion Gowda, RN Outcome: Progressing 02/13/2023 1733 by Asencion Gowda, RN Outcome: Progressing   Problem: Clinical Measurements: Goal: Ability to maintain clinical measurements within normal limits will improve 02/13/2023 1802 by Asencion Gowda, RN Outcome: Progressing 02/13/2023 1733 by Asencion Gowda, RN Outcome: Progressing Goal: Will remain free from infection 02/13/2023 1802 by Asencion Gowda, RN Outcome: Progressing 02/13/2023 1733 by Asencion Gowda, RN Outcome: Progressing Goal: Diagnostic test results will improve 02/13/2023 1802 by Asencion Gowda, RN Outcome: Progressing 02/13/2023 1733 by Asencion Gowda, RN Outcome: Progressing Goal: Respiratory complications will improve 02/13/2023 1802 by Asencion Gowda, RN Outcome:  Progressing 02/13/2023 1733 by Asencion Gowda, RN Outcome: Progressing Goal: Cardiovascular complication will be avoided 02/13/2023 1802 by Asencion Gowda, RN Outcome: Progressing 02/13/2023 1733 by Asencion Gowda, RN Outcome: Progressing   Problem: Activity: Goal: Risk for activity intolerance will decrease 02/13/2023 1802 by Asencion Gowda, RN Outcome: Progressing 02/13/2023 1733 by Asencion Gowda, RN Outcome: Progressing   Problem: Nutrition: Goal: Adequate nutrition will be maintained 02/13/2023 1802 by Asencion Gowda, RN Outcome: Progressing 02/13/2023 1733 by Asencion Gowda, RN Outcome: Progressing   Problem: Coping: Goal: Level of anxiety will decrease 02/13/2023 1802 by Asencion Gowda, RN Outcome: Progressing 02/13/2023 1733 by Asencion Gowda, RN Outcome: Progressing   Problem: Elimination: Goal: Will not experience complications  related to bowel motility 02/13/2023 1802 by Asencion Gowda, RN Outcome: Progressing 02/13/2023 1733 by Asencion Gowda, RN Outcome: Progressing Goal: Will not experience complications related to urinary retention 02/13/2023 1802 by Asencion Gowda, RN Outcome: Progressing 02/13/2023 1733 by Asencion Gowda, RN Outcome: Progressing   Problem: Pain Management: Goal: General experience of comfort will improve 02/13/2023 1802 by Asencion Gowda, RN Outcome: Progressing 02/13/2023 1733 by Asencion Gowda, RN Outcome: Progressing   Problem: Safety: Goal: Ability to remain free from injury will improve 02/13/2023 1802 by Asencion Gowda, RN Outcome: Progressing 02/13/2023 1733 by Asencion Gowda, RN Outcome: Progressing   Problem: Skin Integrity: Goal: Risk for impaired skin integrity will decrease 02/13/2023 1802 by Asencion Gowda, RN Outcome: Progressing 02/13/2023 1733 by Asencion Gowda, RN Outcome: Progressing

## 2023-02-14 DIAGNOSIS — E43 Unspecified severe protein-calorie malnutrition: Secondary | ICD-10-CM | POA: Diagnosis not present

## 2023-02-14 DIAGNOSIS — R2681 Unsteadiness on feet: Secondary | ICD-10-CM | POA: Diagnosis not present

## 2023-02-14 DIAGNOSIS — R1312 Dysphagia, oropharyngeal phase: Secondary | ICD-10-CM | POA: Diagnosis not present

## 2023-02-14 DIAGNOSIS — Z9181 History of falling: Secondary | ICD-10-CM | POA: Diagnosis not present

## 2023-02-14 DIAGNOSIS — Z743 Need for continuous supervision: Secondary | ICD-10-CM | POA: Diagnosis not present

## 2023-02-14 DIAGNOSIS — E441 Mild protein-calorie malnutrition: Secondary | ICD-10-CM | POA: Diagnosis not present

## 2023-02-14 DIAGNOSIS — Z23 Encounter for immunization: Secondary | ICD-10-CM | POA: Diagnosis not present

## 2023-02-14 DIAGNOSIS — M6281 Muscle weakness (generalized): Secondary | ICD-10-CM | POA: Diagnosis not present

## 2023-02-14 DIAGNOSIS — R1310 Dysphagia, unspecified: Secondary | ICD-10-CM

## 2023-02-14 DIAGNOSIS — Z7401 Bed confinement status: Secondary | ICD-10-CM | POA: Diagnosis not present

## 2023-02-14 DIAGNOSIS — I639 Cerebral infarction, unspecified: Secondary | ICD-10-CM | POA: Diagnosis not present

## 2023-02-14 DIAGNOSIS — F0390 Unspecified dementia without behavioral disturbance: Secondary | ICD-10-CM | POA: Diagnosis not present

## 2023-02-14 DIAGNOSIS — E785 Hyperlipidemia, unspecified: Secondary | ICD-10-CM | POA: Diagnosis not present

## 2023-02-14 DIAGNOSIS — D649 Anemia, unspecified: Secondary | ICD-10-CM | POA: Diagnosis not present

## 2023-02-14 DIAGNOSIS — R42 Dizziness and giddiness: Secondary | ICD-10-CM | POA: Diagnosis not present

## 2023-02-14 DIAGNOSIS — R4701 Aphasia: Secondary | ICD-10-CM | POA: Diagnosis not present

## 2023-02-14 DIAGNOSIS — E039 Hypothyroidism, unspecified: Secondary | ICD-10-CM | POA: Diagnosis not present

## 2023-02-14 DIAGNOSIS — R531 Weakness: Secondary | ICD-10-CM | POA: Diagnosis not present

## 2023-02-14 DIAGNOSIS — I6340 Cerebral infarction due to embolism of unspecified cerebral artery: Secondary | ICD-10-CM | POA: Diagnosis not present

## 2023-02-14 DIAGNOSIS — R519 Headache, unspecified: Secondary | ICD-10-CM | POA: Diagnosis not present

## 2023-02-14 DIAGNOSIS — I10 Essential (primary) hypertension: Secondary | ICD-10-CM | POA: Diagnosis not present

## 2023-02-14 DIAGNOSIS — G8191 Hemiplegia, unspecified affecting right dominant side: Secondary | ICD-10-CM | POA: Diagnosis not present

## 2023-02-14 DIAGNOSIS — R262 Difficulty in walking, not elsewhere classified: Secondary | ICD-10-CM | POA: Diagnosis not present

## 2023-02-14 LAB — GLUCOSE, CAPILLARY
Glucose-Capillary: 100 mg/dL — ABNORMAL HIGH (ref 70–99)
Glucose-Capillary: 111 mg/dL — ABNORMAL HIGH (ref 70–99)
Glucose-Capillary: 96 mg/dL (ref 70–99)
Glucose-Capillary: 136 mg/dL — ABNORMAL HIGH (ref 70–99)
Glucose-Capillary: 123 mg/dL — ABNORMAL HIGH (ref 70–99)

## 2023-02-14 MED ORDER — ROSUVASTATIN CALCIUM 20 MG PO TABS: 20.0000 mg | ORAL_TABLET | Freq: Every day | ORAL | Status: AC

## 2023-02-14 MED ORDER — ADULT MULTIVITAMIN W/MINERALS CH: 1.0000 | ORAL_TABLET | Freq: Every day | ORAL | Status: AC

## 2023-02-14 MED ORDER — ORAL CARE MOUTH RINSE
15.0000 mL | OROMUCOSAL | Status: DC
  Administered 2023-02-14 (×2): 15 mL via OROMUCOSAL

## 2023-02-14 MED ORDER — ORAL CARE MOUTH RINSE: 15.0000 mL | OROMUCOSAL | Status: DC | PRN

## 2023-02-14 MED ORDER — BOOST PLUS PO LIQD: 237.0000 mL | Freq: Two times a day (BID) | ORAL | Status: AC

## 2023-02-14 MED ORDER — ORAL CARE MOUTH RINSE: 15.0000 mL | OROMUCOSAL | Status: DC

## 2023-02-14 MED ORDER — ASPIRIN 81 MG PO CHEW: 81.0000 mg | CHEWABLE_TABLET | Freq: Every day | ORAL | Status: AC

## 2023-02-14 NOTE — Progress Notes (Signed)
18:07H Patient transferred to [facility] via [facility]. AVS given to [facility] and endorsement was already been done to the facility. Family informed about the transfer.

## 2023-02-14 NOTE — TOC Progression Note (Signed)
Transition of Care [facility]) - Progression Note    Patient Details  Name: Kristen Parks MRN: [id number] Date of Birth: 05-Nov-1940  [facility]) CM/SW Contact  Baldemar Lenis, [place] Phone Number: 02/14/2023, 10:39 AM  Clinical Narrative:   CSW spoke with daughter, Kristen Parks, to discuss SNF options. CSW answered questions, and daughter went to go visit SNF options to determine choice. After tours, Kristen Parks chose [facility]. CSW spoke with [facility] to confirm bed availability. CSW requested CMA to initiate insurance authorization, and authorization received. CSW to follow.    Expected Discharge Plan: Skilled Nursing Facility Barriers to Discharge: Continued Medical Work up  Expected Discharge Plan and Services     Post Acute Care Choice: Skilled Nursing Facility Living arrangements for the past 2 months: Single Family Home                                       Social Determinants of Health (SDOH) Interventions SDOH Screenings   Food Insecurity: No Food Insecurity (02/10/2023)  Housing: Low Risk  (02/10/2023)  Transportation Needs: No Transportation Needs (02/10/2023)  Utilities: Not At Risk (02/10/2023)  Depression (PHQ2-9): Low Risk  (05/29/2018)  Tobacco Use: Medium Risk (02/07/2023)    Readmission Risk Interventions     No data to display

## 2023-02-14 NOTE — TOC Transition Note (Addendum)
Transition of Care [facility]) - CM/SW Discharge Note   Patient Details  Name: Kristen Parks MRN: [id number] Date of Birth: 01-Feb-1941  [facility]) CM/SW Contact:  Courtne Lighty Felipa [place], Student-Social Work Phone Number: 02/14/2023, 1:35 PM   Clinical Narrative:   MSW Student received insurance approval for patient to admit to SNF. MSW Student confirmed with MD that patient is stable for discharge. MSW Student notified [name] and they are in agreement with discharge. MSW Student confirmed bed is available at SNF. Transport arranged with [facility] for next available.   Number to call report: [phone] [id number]    Final next level of care: Skilled Nursing Facility Barriers to Discharge: Barriers Resolved   Patient Goals and CMS Choice CMS Medicare.gov Compare Post Acute Care list provided to:: Patient Represenative (must comment) Choice offered to / list presented to : Adult Children  Discharge Placement                Patient chooses bed at: [facility] Patient to be transferred to facility by: [facility] Name of family member notified: [name] Patient and family notified of of transfer: 02/14/23  Discharge Plan and Services Additional resources added to the After Visit Summary for       Post Acute Care Choice: Skilled Nursing Facility                               Social Determinants of Health (SDOH) Interventions SDOH Screenings   Food Insecurity: No Food Insecurity (02/10/2023)  Housing: Low Risk  (02/10/2023)  Transportation Needs: No Transportation Needs (02/10/2023)  Utilities: Not At Risk (02/10/2023)  Depression (PHQ2-9): Low Risk  (05/29/2018)  Tobacco Use: Medium Risk (02/07/2023)     Readmission Risk Interventions     No data to display

## 2023-02-14 NOTE — Progress Notes (Signed)
Physical Therapy Treatment Patient Details Name: Kristen Parks MRN: [id number] DOB: Dec 29, 1940 Today's Date: 02/14/2023   History of Present Illness Kristen Parks is a 82 y.o. female with PMH significant for HTN, dyslipidemia, anemia, hypothyroidism, Left corpus collosum infarction 2019, chronic small vessel disease, dementia who was brought into ED d/t  left-gaze preference, aphasia, dysarthria, right-sided weakness. CTH negative for hemorrhage, shows possible infarct posterior left subfrontal gyrus. CTA w/P shows: Left MCA M2 stenosis vs embolus with 12ml core infarct. Patient taken to IR for thrombectomy.    PT Comments  Pt up in chair upon PT arrival to room. Pt able to attend to R side with mod cues from R side, and benefits from sit<>stands in front of mirror to incorporate R side. Pt tolerated 80 ft gait with mod PT assist for correcting R lateral bias and truncal support, PT incorporating RW to encourage automatic stepping and incorporation of RUE. Pt also reaching for environment when no AD given. Plan remains appropriate, PT to cotninue to follow.       If plan is discharge home, recommend the following: A lot of help with walking and/or transfers;Assist for transportation;Help with stairs or ramp for entrance;Supervision due to cognitive status   Can travel by private vehicle        Equipment Recommendations  Other (comment) (defer)    Recommendations for Other Services       Precautions / Restrictions Precautions Precautions: Fall;Other (comment) Precaution Comments: R inattention Restrictions Weight Bearing Restrictions: No     Mobility  Bed Mobility Overal bed mobility: Needs Assistance             General bed mobility comments: up in chair    Transfers Overall transfer level: Needs assistance Equipment used: Rolling walker (2 wheels) Transfers: Sit to/from Stand Sit to Stand: Mod assist           General transfer comment: assist for power up, rise,  steadying, and correcting R lateral bias given inattention. Stand x4 throughout session.    Ambulation/Gait Ambulation/Gait assistance: Mod assist, +2 safety/equipment Gait Distance (Feet): 80 Feet Assistive device: Rolling walker (2 wheels) Gait Pattern/deviations: Step-through pattern, Decreased step length - right, Drifts right/left, Trunk flexed, Narrow base of support Gait velocity: decr     General Gait Details: assist to steady, guide RW, upright posture, and correct R lateral bias at hips. Use of RW to encourage automatic stepping which pt benefits from, holds RW with RUE when PT places hand on RW prior to gait.   Stairs             Wheelchair Mobility     Tilt Bed    Modified Rankin (Stroke Patients Only)       Balance Overall balance assessment: Needs assistance Sitting-balance support: Feet supported, Bilateral upper extremity supported Sitting balance-Leahy Scale: Fair     Standing balance support: Bilateral upper extremity supported, During functional activity, Reliant on assistive device for balance Standing balance-Leahy Scale: Poor Standing balance comment: heavy reliance on external support                            Cognition Arousal: Alert Behavior During Therapy: Flat affect Overall Cognitive Status: Difficult to assess                                 General Comments: inconsistently responding yes/no appropriately, flat affect  but periods of sassiness with PT cuing. appears to respond best to gestural and short verbal cues. Pt responding automatically several times during session with known phrases (I'm making it)        Exercises      General Comments General comments (skin integrity, edema, etc.): stool incontinence in hallway      Pertinent Vitals/Pain Pain Assessment Pain Assessment: Faces Faces Pain Scale: No hurt    Home Living                          Prior Function            PT  Goals (current goals can now be found in the care plan section) Acute Rehab PT Goals Patient Stated Goal: home per family PT Goal Formulation: With patient/family Time For Goal Achievement: 02/22/23 Potential to Achieve Goals: Good Progress towards PT goals: Progressing toward goals    Frequency    Min 1X/week      PT Plan      Co-evaluation              AM-PAC PT "6 Clicks" Mobility   Outcome Measure  Help needed turning from your back to your side while in a flat bed without using bedrails?: A Lot Help needed moving from lying on your back to sitting on the side of a flat bed without using bedrails?: A Lot Help needed moving to and from a bed to a chair (including a wheelchair)?: A Lot Help needed standing up from a chair using your arms (e.g., wheelchair or bedside chair)?: A Lot Help needed to walk in hospital room?: A Lot Help needed climbing 3-5 steps with a railing? : Total 6 Click Score: 11    End of Session Equipment Utilized During Treatment: Gait belt Activity Tolerance: Patient tolerated treatment well;Patient limited by fatigue Patient left: in chair;with call bell/phone within reach;with chair alarm set;with family/visitor present Nurse Communication: Mobility status PT Visit Diagnosis: Other abnormalities of gait and mobility (R26.89)     Time: 2956-2130 PT Time Calculation (min) (ACUTE ONLY): 28 min  Charges:    $Gait Training: 8-22 mins $Therapeutic Activity: 8-22 mins PT General Charges $$ ACUTE PT VISIT: 1 Visit                     Kristen Parks, PT DPT Acute Rehabilitation Services Secure Chat Preferred  Office [phone]    Kristen Parks Kristen Parks 02/14/2023, 12:18 PM

## 2023-02-14 NOTE — Plan of Care (Signed)
  Problem: Education: Goal: Knowledge of disease or condition will improve Outcome: Progressing Goal: Knowledge of secondary prevention will improve (MUST DOCUMENT ALL) Outcome: Progressing Goal: Knowledge of patient specific risk factors will improve Elta Gu[place] N/A or DELETE if not current risk factor) Outcome: Progressing   Problem: Ischemic Stroke/TIA Tissue Perfusion: Goal: Complications of ischemic stroke/TIA will be minimized Outcome: Progressing   Problem: Coping: Goal: Will verbalize positive feelings about self Outcome: Progressing Goal: Will identify appropriate support needs Outcome: Progressing

## 2023-02-14 NOTE — TOC Initial Note (Signed)
Transition of Care [facility]) - Initial/Assessment Note    Patient Details  Name: Kristen Parks MRN: [id number] Date of Birth: 1940/05/24  Transition of Care [facility]) CM/SW Contact:    Baldemar Lenis, LCSW Phone Number: 02/14/2023, 10:19 AM  Clinical Narrative:      CSW spoke with daughter, [name], about SNF recommendation. CSW answered questions, daughter has no preference for SNF. CSW sent out referral, printed out bed offers for daughter to pick up at the bedside when she returns to the hospital. CSW to follow.             Expected Discharge Plan: Skilled Nursing Facility Barriers to Discharge: Continued Medical Work up, English as a second language teacher   Patient Goals and CMS Choice Patient states their goals for this hospitalization and ongoing recovery are:: patient unable to participate in goal setting, not oriented CMS Medicare.gov Compare Post Acute Care list provided to:: Patient Represenative (must comment) Choice offered to / list presented to : Adult Children [place] ownership interest in [facility].provided to:: Adult Children    Expected Discharge Plan and Services     Post Acute Care Choice: Skilled Nursing Facility Living arrangements for the past 2 months: Single Family Home                                      Prior Living Arrangements/Services Living arrangements for the past 2 months: Single Family Home Lives with:: Adult Children Patient language and need for interpreter reviewed:: No Do you feel safe going back to the place where you live?: Yes      Need for Family Participation in Patient Care: Yes (Comment) Care giver support system in place?: Yes (comment)   Criminal Activity/Legal Involvement Pertinent to Current Situation/Hospitalization: No - Comment as needed  Activities of Daily Living   ADL Screening (condition at time of admission) Independently performs ADLs?: No Does the patient have a NEW difficulty with  bathing/dressing/toileting/self-feeding that is expected to last >3 days?: Yes (Initiates electronic notice to provider for possible OT consult) Does the patient have a NEW difficulty with getting in/out of bed, walking, or climbing stairs that is expected to last >3 days?: Yes (Initiates electronic notice to provider for possible PT consult) Does the patient have a NEW difficulty with communication that is expected to last >3 days?: Yes (Initiates electronic notice to provider for possible SLP consult) Is the patient deaf or have difficulty hearing?: Yes Does the patient have difficulty seeing, even when wearing glasses/contacts?: Yes Does the patient have difficulty concentrating, remembering, or making decisions?: Yes  Permission Sought/Granted Permission sought to share information with : Facility Medical sales representative, Family Supports Permission granted to share information with : Yes, Verbal Permission Granted  Share Information with NAME: [name]  Permission granted to share info w AGENCY: SNF  Permission granted to share info w Relationship: Daughter     Emotional Assessment Appearance:: Appears stated age Attitude/Demeanor/Rapport: Unable to Assess Affect (typically observed): Unable to Assess Orientation: : Oriented to Self Alcohol / Substance Use: Not Applicable Psych Involvement: No (comment)  Admission diagnosis:  Stroke (cerebrum) (HCC) [I63.9] Ischemic stroke [facility]) [I63.9] Patient Active Problem List   Diagnosis Date Noted   Protein-calorie malnutrition, severe 02/09/2023   Stroke (cerebrum) (HCC) 02/07/2023   Female bladder prolapse 05/01/2018   Symptomatic anemia 04/14/2018   CVA (cerebral vascular accident) (HCC) 04/14/2018   Essential hypertension    Dyslipidemia  Dementia (HCC)    Acquired hypothyroidism    PCP:  Garlan Fillers, MD Pharmacy:   [facility] [address], [place] - [address]  [place] [address] Phone: [phone] Fax: [phone]  [facility] [address] [place] [address] Phone: [phone] Fax: [phone]     Social Determinants of Health (SDOH) Social History: SDOH Screenings   Food Insecurity: No Food Insecurity (02/10/2023)  Housing: Low Risk  (02/10/2023)  Transportation Needs: No Transportation Needs (02/10/2023)  Utilities: Not At Risk (02/10/2023)  Depression (PHQ2-9): Low Risk  (05/29/2018)  Tobacco Use: Medium Risk (02/07/2023)   SDOH Interventions:     Readmission Risk Interventions     No data to display

## 2023-02-14 NOTE — Plan of Care (Signed)
Patient awake and alert throughout shift. All needs attended to. Repositioned frequently. Will continue to monitor.   Problem: Education: Goal: Knowledge of disease or condition will improve Outcome: Progressing Goal: Knowledge of secondary prevention will improve (MUST DOCUMENT ALL) Outcome: Progressing Goal: Knowledge of patient specific risk factors will improve Loraine Leriche N/A or DELETE if not current risk factor) Outcome: Progressing   Problem: Ischemic Stroke/TIA Tissue Perfusion: Goal: Complications of ischemic stroke/TIA will be minimized Outcome: Progressing   Problem: Coping: Goal: Will verbalize positive feelings about self Outcome: Progressing Goal: Will identify appropriate support needs Outcome: Progressing   Problem: Health Behavior/Discharge Planning: Goal: Ability to manage health-related needs will improve Outcome: Progressing Goal: Goals will be collaboratively established with patient/family Outcome: Progressing   Problem: Self-Care: Goal: Ability to participate in self-care as condition permits will improve Outcome: Progressing Goal: Verbalization of feelings and concerns over difficulty with self-care will improve Outcome: Progressing Goal: Ability to communicate needs accurately will improve Outcome: Progressing   Problem: Nutrition: Goal: Risk of aspiration will decrease Outcome: Progressing Goal: Dietary intake will improve Outcome: Progressing   Problem: Education: Goal: Understanding of CV disease, CV risk reduction, and recovery process will improve Outcome: Progressing Goal: Individualized Educational Video(s) Outcome: Progressing   Problem: Activity: Goal: Ability to return to baseline activity level will improve Outcome: Progressing   Problem: Cardiovascular: Goal: Ability to achieve and maintain adequate cardiovascular perfusion will improve Outcome: Progressing Goal: Vascular access site(s) Level 0-1 will be maintained Outcome:  Progressing   Problem: Health Behavior/Discharge Planning: Goal: Ability to safely manage health-related needs after discharge will improve Outcome: Progressing   Problem: Education: Goal: Ability to describe self-care measures that may prevent or decrease complications (Diabetes Survival Skills Education) will improve Outcome: Progressing Goal: Individualized Educational Video(s) Outcome: Progressing   Problem: Coping: Goal: Ability to adjust to condition or change in health will improve Outcome: Progressing   Problem: Fluid Volume: Goal: Ability to maintain a balanced intake and output will improve Outcome: Progressing   Problem: Health Behavior/Discharge Planning: Goal: Ability to identify and utilize available resources and services will improve Outcome: Progressing Goal: Ability to manage health-related needs will improve Outcome: Progressing   Problem: Metabolic: Goal: Ability to maintain appropriate glucose levels will improve Outcome: Progressing   Problem: Nutritional: Goal: Maintenance of adequate nutrition will improve Outcome: Progressing Goal: Progress toward achieving an optimal weight will improve Outcome: Progressing   Problem: Skin Integrity: Goal: Risk for impaired skin integrity will decrease Outcome: Progressing   Problem: Tissue Perfusion: Goal: Adequacy of tissue perfusion will improve Outcome: Progressing   Problem: Education: Goal: Knowledge of General Education information will improve Description: Including pain rating scale, medication(s)/side effects and non-pharmacologic comfort measures Outcome: Progressing   Problem: Health Behavior/Discharge Planning: Goal: Ability to manage health-related needs will improve Outcome: Progressing   Problem: Clinical Measurements: Goal: Ability to maintain clinical measurements within normal limits will improve Outcome: Progressing Goal: Will remain free from infection Outcome: Progressing Goal:  Diagnostic test results will improve Outcome: Progressing Goal: Respiratory complications will improve Outcome: Progressing Goal: Cardiovascular complication will be avoided Outcome: Progressing

## 2023-02-14 NOTE — Discharge Summary (Addendum)
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 on L A4 and TICI2c on L M3 complicated by extravasation at left M3 s/p coil embolization, embolic pattern, likely secondary to cardioembolic source   Secondary Diagnoses: Hypertension Hyperlipidemia History of stroke Dysphagia   Allergies as of 02/14/2023       Reactions   Latex Other (See Comments)   Reaction type/severity unknown   Penicillins Other (See Comments)   Reaction type/severity unknown   Sulfa Antibiotics Other (See Comments)   Reaction type/severity unknown        Medication List     STOP taking these medications    aspirin EC 81 MG tablet Replaced by: aspirin 81 MG chewable tablet       TAKE these medications    aspirin 81 MG chewable tablet Chew 1 tablet (81 mg total) by mouth daily. Start taking on: February 15, 2023 Replaces: aspirin EC 81 MG tablet   lactose free nutrition Liqd Take 237 mLs by mouth 2 (two) times daily between meals.   levothyroxine 112 MCG tablet Commonly known as: SYNTHROID Take 112 mcg by mouth daily.   multivitamin with minerals Tabs tablet Take 1 tablet by mouth daily. Start taking on: February 15, 2023   rosuvastatin 20 MG tablet Commonly known as: CRESTOR Take 1 tablet (20 mg total) by mouth daily. Start taking on: February 15, 2023        LABORATORY STUDIES CBC    Component Value Date/Time   WBC 10.1 02/12/2023 0646   RBC 3.97 02/12/2023 0646   HGB 12.2 02/12/2023 0646   HCT 37.4 02/12/2023 0646   PLT 249 02/12/2023 0646   MCV 94.2 02/12/2023 0646   MCH 30.7 02/12/2023 0646   MCHC 32.6 02/12/2023 0646   RDW 12.5 02/12/2023 0646   LYMPHSABS 1.3 02/07/2023 1045    MONOABS 0.4 02/07/2023 1045   EOSABS 0.0 02/07/2023 1045   BASOSABS 0.1 02/07/2023 1045   CMP    Component Value Date/Time   NA 135 02/12/2023 0646   K 4.2 02/12/2023 0646   CL 107 02/12/2023 0646   CO2 23 02/12/2023 0646   GLUCOSE 124 (H) 02/12/2023 0646   BUN 22 02/12/2023 0646   CREATININE 0.70 02/12/2023 0646   CALCIUM 9.4 02/12/2023 0646   PROT 7.5 02/07/2023 1045   ALBUMIN 3.8 02/07/2023 1045   AST 20 02/07/2023 1045   ALT 11 02/07/2023 1045   ALKPHOS 180 (H) 02/07/2023 1045   BILITOT 0.7 02/07/2023 1045   GFRNONAA >60 02/12/2023 0646   GFRAA >60 04/16/2018 0332   COAGS Lab Results  Component Value Date   INR 1.1 02/07/2023   INR 1.16 04/14/2018   Lipid Panel    Component Value Date/Time   CHOL 157 02/08/2023 0630   TRIG 100 02/08/2023 0630   HDL 36 (L) 02/08/2023 0630   CHOLHDL 4.4 02/08/2023 0630   VLDL 20 02/08/2023 0630   LDLCALC 101 (H) 02/08/2023 0630   HgbA1C  Lab Results  Component Value Date   HGBA1C 5.2 02/07/2023   Urine Drug Screen   Alcohol Level    Component Value Date/Time   ETH <10  02/07/2023 1029     SIGNIFICANT DIAGNOSTIC STUDIES IR PERCUTANEOUS ART THROMBECTOMY/INFUSION INTRACRANIAL INC DIAG ANGIO  Result Date: 02/11/2023 INDICATION: Kristen Parks is an 82 year old female presenting with left-gaze preference, aphasia, dysarthria, right-sided weakness; NIHSS 19. Her last known well was 10 p.m. on 02/06/2023. Her past medical history significant for hypertension, dyslipidemia, anemia, hypothyroidism, Left corpus collosum infarction in 2019, chronic small vessel disease; baseline modified Rankin scale 3. Head CT showed no evidence of hemorrhage. On my personal review, hypodensity in the posterior left ACA and posterior left MCA territories was seen (MCA - ASPECTS 6). No IV thrombolytic was administered as patient was outside the window. CT angiogram of the head and neck showed an occlusion of the distal left pericallosal artery (A4  segment) in the narrowing of the left M2/MCA posterior division branch, concerning for possible nonocclusive embolus versus stenosis. Findings discussed with patient's daughter including risks and potential benefits of intervention. Decision made to proceed with mechanical thrombectomy. EXAM: ULTRASOUND-GUIDED VASCULAR ACCESS DIAGNOSTIC CEREBRAL ANGIOGRAM MECHANICAL THROMBECTOMY ENDOVASCULAR VESSEL EMBOLIZATION FLAT PANEL HEAD CT COMPARISON:  CT/CT angiogram of the head and neck February 07, 2023. MEDICATIONS: Ancef 2 g IV. ANESTHESIA/SEDATION: The procedure was performed under general anesthesia. CONTRAST:  80 mL of Omnipaque 300 milligram/mL FLUOROSCOPY: Radiation Exposure Index (as provided by the fluoroscopic device): 982 mGy Kerma COMPLICATIONS: SIR LEVEL C - Requires therapy, minor hospitalization (<48 hrs). TECHNIQUE: Informed written consent was obtained from the patient's daughter after a thorough discussion of the procedural risks, benefits and alternatives. All questions were addressed. Maximal Sterile Barrier Technique was utilized including caps, mask, sterile gowns, sterile gloves, sterile drape, hand hygiene and skin antiseptic. A timeout was performed prior to the initiation of the procedure. The right groin was prepped and draped in the usual sterile fashion. Using a micropuncture kit and the modified Seldinger technique, access was gained to the right common femoral artery and an 8 French sheath was placed. Real-time ultrasound guidance was utilized for vascular access including the acquisition of a permanent ultrasound image documenting patency of the accessed vessel. Under fluoroscopy, a Zoom 88 guide catheter was navigated over a 6 [place] VTK catheter and a 0.035" Terumo Glidewire into the aortic arch. The catheter was placed into the left common carotid artery. The VTK catheter was removed. Using biplane roadmap guidance, the a was advanced over the wire into the mid cervical segment of the left  ICA. Frontal and lateral angiograms of the head were obtained. FINDINGS: 1. Mild atherosclerotic changes of the right common femoral artery without hemodynamically significant stenosis. 2. Increased tortuosity of the cervical segment of the left ICA. No significant atherosclerotic disease or stenosis at the left carotid bifurcation. Occlusion of the distal right pericallosal artery at the A4 segment. 3. Occlusion of a left M3/MCA posterior region branch. 4. Increased and early capillary blush in the left posterior MCA territory in the parietal region with early venous drainage, suggestive of ongoing ischemia. 5. No left M2/MCA occlusion or significant stenosis. PROCEDURE: Using biplane roadmap guidance, a Socrates 38 aspiration catheter was navigated over Colossus 35 guidewire into the left A1/ACA. The aspiration catheter was then advanced over the wire to the level of occlusion in the A4 segment and connected to an aspiration pump. Continuous aspiration was performed for 2 minutes. The guide catheter was connected to a VacLok syringe. The aspiration catheter was subsequently removed under constant aspiration. The guide catheter was aspirated for debris. Left internal carotid artery angiograms with frontal and lateral views of  the head showed recanalization of the left pericallosal artery with small residual, nonocclusive filling defect well maintained brisk anterograde flow. Persistent occlusion of the distal left M3/MCA segment was seen. Using biplane roadmap guidance, a Socrates 38 aspiration catheter was navigated over Colossus 35 guidewire into the left M2/MCA. The aspiration catheter was then advanced over the wire to the level of occlusion in the M3 segment and connected to an aspiration pump. Continuous aspiration was performed for 2 minutes. The guide catheter was connected to a VacLok syringe. The aspiration catheter was subsequently removed under constant aspiration. The guide catheter was aspirated for  debris. Left internal carotid artery angiograms with frontal and lateral views of the head showed recanalization of the M3 segment with slow distal flow. However, active contrast extravasation was noted at the M3 level. Using biplane roadmap guidance, a phenom 21 microcatheter was navigated over an Aristotle 14 micro guidewire into the left M2/MCA posterior division branch and then into the M3 segment, at the level of bursae contrast extravasation. Then, a detachable coils were placed at the level of contrast extravasation (2 mm x 6 cm target XL 360 soft x2). Control angiograms were obtained. Then, the microcatheter was prepped with dextrose 5%. Subsequently infusion of n-BCA was performed within the recently deployed coil mass. The microcatheter was then removed under constant aspiration. Left internal carotid artery angiograms with frontal and lateral views of the head showed distal occlusion of the left M3/MCA posterior division branch with complete resolution of contrast extravasation. Flat panel CT of the head was obtained and post processed in a separate workstation with concurrent attending physician supervision. Selected images were sent to PACS. Moderate hyperdensity in the left sylvian fissure was noted, consistent with contrast extravasation. Delayed left internal carotid artery angiograms with frontal and lateral views of the head showed stable findings with persistent patency of the left pericallosal artery, occlusion of the distal left M3/MCA at the level of the coil mass with no evidence of contrast extravasation. The catheter was subsequently withdrawn. Right common femoral artery angiogram was obtained in right anterior oblique view. The puncture is at the level of the common femoral artery. The artery has normal caliber, adequate for closure device. The sheath was exchanged over the wire for a Perclose prostyle which was utilized for access closure. Immediate hemostasis was achieved. IMPRESSION: 1.  Mechanical thrombectomy performed for treatment of a left pericallosal and left M3/MCA occlusion. 2. Recanalization of the left pericallosal artery achieved (TICI 3). 3. Recanalization of the left M3/MCA with slow flow, complicated by contrast extravasation requiring occlusion of the distal M3 branch with coils and n-BCA. Final left MCA patency status is a TICI 2B. PLAN: TRansfer to ICU. Electronically Signed   By: Baldemar Lenis M.D.   On: 02/11/2023 11:03   IR US Guide Vasc Access Right  Result Date: 02/11/2023 INDICATION: Kristen Parks is an 83 year old female presenting with left-gaze preference, aphasia, dysarthria, right-sided weakness; NIHSS 19. Her last known well was 10 p.m. on 02/06/2023. Her past medical history significant for hypertension, dyslipidemia, anemia, hypothyroidism, Left corpus collosum infarction in 2019, chronic small vessel disease; baseline modified Rankin scale 3. Head CT showed no evidence of hemorrhage. On my personal review, hypodensity in the posterior left ACA and posterior left MCA territories was seen (MCA - ASPECTS 6). No IV thrombolytic was administered as patient was outside the window. CT angiogram of the head and neck showed an occlusion of the distal left pericallosal artery (A4 segment) in  the narrowing of the left M2/MCA posterior division branch, concerning for possible nonocclusive embolus versus stenosis. Findings discussed with patient's daughter including risks and potential benefits of intervention. Decision made to proceed with mechanical thrombectomy. EXAM: ULTRASOUND-GUIDED VASCULAR ACCESS DIAGNOSTIC CEREBRAL ANGIOGRAM MECHANICAL THROMBECTOMY ENDOVASCULAR VESSEL EMBOLIZATION FLAT PANEL HEAD CT COMPARISON:  CT/CT angiogram of the head and neck February 07, 2023. MEDICATIONS: Ancef 2 g IV. ANESTHESIA/SEDATION: The procedure was performed under general anesthesia. CONTRAST:  80 mL of Omnipaque 300 milligram/mL FLUOROSCOPY: Radiation Exposure Index  (as provided by the fluoroscopic device): 982 mGy Kerma COMPLICATIONS: SIR LEVEL C - Requires therapy, minor hospitalization (<48 hrs). TECHNIQUE: Informed written consent was obtained from the patient's daughter after a thorough discussion of the procedural risks, benefits and alternatives. All questions were addressed. Maximal Sterile Barrier Technique was utilized including caps, mask, sterile gowns, sterile gloves, sterile drape, hand hygiene and skin antiseptic. A timeout was performed prior to the initiation of the procedure. The right groin was prepped and draped in the usual sterile fashion. Using a micropuncture kit and the modified Seldinger technique, access was gained to the right common femoral artery and an 8 French sheath was placed. Real-time ultrasound guidance was utilized for vascular access including the acquisition of a permanent ultrasound image documenting patency of the accessed vessel. Under fluoroscopy, a Zoom 88 guide catheter was navigated over a 6 [place] VTK catheter and a 0.035" Terumo Glidewire into the aortic arch. The catheter was placed into the left common carotid artery. The VTK catheter was removed. Using biplane roadmap guidance, the a was advanced over the wire into the mid cervical segment of the left ICA. Frontal and lateral angiograms of the head were obtained. FINDINGS: 1. Mild atherosclerotic changes of the right common femoral artery without hemodynamically significant stenosis. 2. Increased tortuosity of the cervical segment of the left ICA. No significant atherosclerotic disease or stenosis at the left carotid bifurcation. Occlusion of the distal right pericallosal artery at the A4 segment. 3. Occlusion of a left M3/MCA posterior region branch. 4. Increased and early capillary blush in the left posterior MCA territory in the parietal region with early venous drainage, suggestive of ongoing ischemia. 5. No left M2/MCA occlusion or significant stenosis. PROCEDURE: Using  biplane roadmap guidance, a Socrates 38 aspiration catheter was navigated over Colossus 35 guidewire into the left A1/ACA. The aspiration catheter was then advanced over the wire to the level of occlusion in the A4 segment and connected to an aspiration pump. Continuous aspiration was performed for 2 minutes. The guide catheter was connected to a VacLok syringe. The aspiration catheter was subsequently removed under constant aspiration. The guide catheter was aspirated for debris. Left internal carotid artery angiograms with frontal and lateral views of the head showed recanalization of the left pericallosal artery with small residual, nonocclusive filling defect well maintained brisk anterograde flow. Persistent occlusion of the distal left M3/MCA segment was seen. Using biplane roadmap guidance, a Socrates 38 aspiration catheter was navigated over Colossus 35 guidewire into the left M2/MCA. The aspiration catheter was then advanced over the wire to the level of occlusion in the M3 segment and connected to an aspiration pump. Continuous aspiration was performed for 2 minutes. The guide catheter was connected to a VacLok syringe. The aspiration catheter was subsequently removed under constant aspiration. The guide catheter was aspirated for debris. Left internal carotid artery angiograms with frontal and lateral views of the head showed recanalization of the M3 segment with slow distal flow. However, active contrast  extravasation was noted at the M3 level. Using biplane roadmap guidance, a phenom 21 microcatheter was navigated over an Aristotle 14 micro guidewire into the left M2/MCA posterior division branch and then into the M3 segment, at the level of bursae contrast extravasation. Then, a detachable coils were placed at the level of contrast extravasation (2 mm x 6 cm target XL 360 soft x2). Control angiograms were obtained. Then, the microcatheter was prepped with dextrose 5%. Subsequently infusion of n-BCA was  performed within the recently deployed coil mass. The microcatheter was then removed under constant aspiration. Left internal carotid artery angiograms with frontal and lateral views of the head showed distal occlusion of the left M3/MCA posterior division branch with complete resolution of contrast extravasation. Flat panel CT of the head was obtained and post processed in a separate workstation with concurrent attending physician supervision. Selected images were sent to PACS. Moderate hyperdensity in the left sylvian fissure was noted, consistent with contrast extravasation. Delayed left internal carotid artery angiograms with frontal and lateral views of the head showed stable findings with persistent patency of the left pericallosal artery, occlusion of the distal left M3/MCA at the level of the coil mass with no evidence of contrast extravasation. The catheter was subsequently withdrawn. Right common femoral artery angiogram was obtained in right anterior oblique view. The puncture is at the level of the common femoral artery. The artery has normal caliber, adequate for closure device. The sheath was exchanged over the wire for a Perclose prostyle which was utilized for access closure. Immediate hemostasis was achieved. IMPRESSION: 1. Mechanical thrombectomy performed for treatment of a left pericallosal and left M3/MCA occlusion. 2. Recanalization of the left pericallosal artery achieved (TICI 3). 3. Recanalization of the left M3/MCA with slow flow, complicated by contrast extravasation requiring occlusion of the distal M3 branch with coils and n-BCA. Final left MCA patency status is a TICI 2B. PLAN: TRansfer to ICU. Electronically Signed   By: Baldemar Lenis M.D.   On: 02/11/2023 11:03   IR CT Head Ltd  Result Date: 02/11/2023 INDICATION: Kristen Parks is an 82 year old female presenting with left-gaze preference, aphasia, dysarthria, right-sided weakness; NIHSS 19. Her last known well  was 10 p.m. on 02/06/2023. Her past medical history significant for hypertension, dyslipidemia, anemia, hypothyroidism, Left corpus collosum infarction in 2019, chronic small vessel disease; baseline modified Rankin scale 3. Head CT showed no evidence of hemorrhage. On my personal review, hypodensity in the posterior left ACA and posterior left MCA territories was seen (MCA - ASPECTS 6). No IV thrombolytic was administered as patient was outside the window. CT angiogram of the head and neck showed an occlusion of the distal left pericallosal artery (A4 segment) in the narrowing of the left M2/MCA posterior division branch, concerning for possible nonocclusive embolus versus stenosis. Findings discussed with patient's daughter including risks and potential benefits of intervention. Decision made to proceed with mechanical thrombectomy. EXAM: ULTRASOUND-GUIDED VASCULAR ACCESS DIAGNOSTIC CEREBRAL ANGIOGRAM MECHANICAL THROMBECTOMY ENDOVASCULAR VESSEL EMBOLIZATION FLAT PANEL HEAD CT COMPARISON:  CT/CT angiogram of the head and neck February 07, 2023. MEDICATIONS: Ancef 2 g IV. ANESTHESIA/SEDATION: The procedure was performed under general anesthesia. CONTRAST:  80 mL of Omnipaque 300 milligram/mL FLUOROSCOPY: Radiation Exposure Index (as provided by the fluoroscopic device): 982 mGy Kerma COMPLICATIONS: SIR LEVEL C - Requires therapy, minor hospitalization (<48 hrs). TECHNIQUE: Informed written consent was obtained from the patient's daughter after a thorough discussion of the procedural risks, benefits and alternatives. All questions were addressed.  Maximal Sterile Barrier Technique was utilized including caps, mask, sterile gowns, sterile gloves, sterile drape, hand hygiene and skin antiseptic. A timeout was performed prior to the initiation of the procedure. The right groin was prepped and draped in the usual sterile fashion. Using a micropuncture kit and the modified Seldinger technique, access was gained to the right  common femoral artery and an 8 French sheath was placed. Real-time ultrasound guidance was utilized for vascular access including the acquisition of a permanent ultrasound image documenting patency of the accessed vessel. Under fluoroscopy, a Zoom 88 guide catheter was navigated over a 6 [place] VTK catheter and a 0.035" Terumo Glidewire into the aortic arch. The catheter was placed into the left common carotid artery. The VTK catheter was removed. Using biplane roadmap guidance, the a was advanced over the wire into the mid cervical segment of the left ICA. Frontal and lateral angiograms of the head were obtained. FINDINGS: 1. Mild atherosclerotic changes of the right common femoral artery without hemodynamically significant stenosis. 2. Increased tortuosity of the cervical segment of the left ICA. No significant atherosclerotic disease or stenosis at the left carotid bifurcation. Occlusion of the distal right pericallosal artery at the A4 segment. 3. Occlusion of a left M3/MCA posterior region branch. 4. Increased and early capillary blush in the left posterior MCA territory in the parietal region with early venous drainage, suggestive of ongoing ischemia. 5. No left M2/MCA occlusion or significant stenosis. PROCEDURE: Using biplane roadmap guidance, a Socrates 38 aspiration catheter was navigated over Colossus 35 guidewire into the left A1/ACA. The aspiration catheter was then advanced over the wire to the level of occlusion in the A4 segment and connected to an aspiration pump. Continuous aspiration was performed for 2 minutes. The guide catheter was connected to a VacLok syringe. The aspiration catheter was subsequently removed under constant aspiration. The guide catheter was aspirated for debris. Left internal carotid artery angiograms with frontal and lateral views of the head showed recanalization of the left pericallosal artery with small residual, nonocclusive filling defect well maintained brisk  anterograde flow. Persistent occlusion of the distal left M3/MCA segment was seen. Using biplane roadmap guidance, a Socrates 38 aspiration catheter was navigated over Colossus 35 guidewire into the left M2/MCA. The aspiration catheter was then advanced over the wire to the level of occlusion in the M3 segment and connected to an aspiration pump. Continuous aspiration was performed for 2 minutes. The guide catheter was connected to a VacLok syringe. The aspiration catheter was subsequently removed under constant aspiration. The guide catheter was aspirated for debris. Left internal carotid artery angiograms with frontal and lateral views of the head showed recanalization of the M3 segment with slow distal flow. However, active contrast extravasation was noted at the M3 level. Using biplane roadmap guidance, a phenom 21 microcatheter was navigated over an Aristotle 14 micro guidewire into the left M2/MCA posterior division branch and then into the M3 segment, at the level of bursae contrast extravasation. Then, a detachable coils were placed at the level of contrast extravasation (2 mm x 6 cm target XL 360 soft x2). Control angiograms were obtained. Then, the microcatheter was prepped with dextrose 5%. Subsequently infusion of n-BCA was performed within the recently deployed coil mass. The microcatheter was then removed under constant aspiration. Left internal carotid artery angiograms with frontal and lateral views of the head showed distal occlusion of the left M3/MCA posterior division branch with complete resolution of contrast extravasation. Flat panel CT of the head was  obtained and post processed in a separate workstation with concurrent attending physician supervision. Selected images were sent to PACS. Moderate hyperdensity in the left sylvian fissure was noted, consistent with contrast extravasation. Delayed left internal carotid artery angiograms with frontal and lateral views of the head showed stable  findings with persistent patency of the left pericallosal artery, occlusion of the distal left M3/MCA at the level of the coil mass with no evidence of contrast extravasation. The catheter was subsequently withdrawn. Right common femoral artery angiogram was obtained in right anterior oblique view. The puncture is at the level of the common femoral artery. The artery has normal caliber, adequate for closure device. The sheath was exchanged over the wire for a Perclose prostyle which was utilized for access closure. Immediate hemostasis was achieved. IMPRESSION: 1. Mechanical thrombectomy performed for treatment of a left pericallosal and left M3/MCA occlusion. 2. Recanalization of the left pericallosal artery achieved (TICI 3). 3. Recanalization of the left M3/MCA with slow flow, complicated by contrast extravasation requiring occlusion of the distal M3 branch with coils and n-BCA. Final left MCA patency status is a TICI 2B. PLAN: TRansfer to ICU. Electronically Signed   By: Baldemar Lenis M.D.   On: 02/11/2023 11:03   IR Transcath/Emboliz  Result Date: 02/11/2023 INDICATION: Kristen Parks is an 82 year old female presenting with left-gaze preference, aphasia, dysarthria, right-sided weakness; NIHSS 19. Her last known well was 10 p.m. on 02/06/2023. Her past medical history significant for hypertension, dyslipidemia, anemia, hypothyroidism, Left corpus collosum infarction in 2019, chronic small vessel disease; baseline modified Rankin scale 3. Head CT showed no evidence of hemorrhage. On my personal review, hypodensity in the posterior left ACA and posterior left MCA territories was seen (MCA - ASPECTS 6). No IV thrombolytic was administered as patient was outside the window. CT angiogram of the head and neck showed an occlusion of the distal left pericallosal artery (A4 segment) in the narrowing of the left M2/MCA posterior division branch, concerning for possible nonocclusive embolus versus  stenosis. Findings discussed with patient's daughter including risks and potential benefits of intervention. Decision made to proceed with mechanical thrombectomy. EXAM: ULTRASOUND-GUIDED VASCULAR ACCESS DIAGNOSTIC CEREBRAL ANGIOGRAM MECHANICAL THROMBECTOMY ENDOVASCULAR VESSEL EMBOLIZATION FLAT PANEL HEAD CT COMPARISON:  CT/CT angiogram of the head and neck February 07, 2023. MEDICATIONS: Ancef 2 g IV. ANESTHESIA/SEDATION: The procedure was performed under general anesthesia. CONTRAST:  80 mL of Omnipaque 300 milligram/mL FLUOROSCOPY: Radiation Exposure Index (as provided by the fluoroscopic device): 982 mGy Kerma COMPLICATIONS: SIR LEVEL C - Requires therapy, minor hospitalization (<48 hrs). TECHNIQUE: Informed written consent was obtained from the patient's daughter after a thorough discussion of the procedural risks, benefits and alternatives. All questions were addressed. Maximal Sterile Barrier Technique was utilized including caps, mask, sterile gowns, sterile gloves, sterile drape, hand hygiene and skin antiseptic. A timeout was performed prior to the initiation of the procedure. The right groin was prepped and draped in the usual sterile fashion. Using a micropuncture kit and the modified Seldinger technique, access was gained to the right common femoral artery and an 8 French sheath was placed. Real-time ultrasound guidance was utilized for vascular access including the acquisition of a permanent ultrasound image documenting patency of the accessed vessel. Under fluoroscopy, a Zoom 88 guide catheter was navigated over a 6 [place] VTK catheter and a 0.035" Terumo Glidewire into the aortic arch. The catheter was placed into the left common carotid artery. The VTK catheter was removed. Using biplane roadmap guidance, the a  was advanced over the wire into the mid cervical segment of the left ICA. Frontal and lateral angiograms of the head were obtained. FINDINGS: 1. Mild atherosclerotic changes of the right  common femoral artery without hemodynamically significant stenosis. 2. Increased tortuosity of the cervical segment of the left ICA. No significant atherosclerotic disease or stenosis at the left carotid bifurcation. Occlusion of the distal right pericallosal artery at the A4 segment. 3. Occlusion of a left M3/MCA posterior region branch. 4. Increased and early capillary blush in the left posterior MCA territory in the parietal region with early venous drainage, suggestive of ongoing ischemia. 5. No left M2/MCA occlusion or significant stenosis. PROCEDURE: Using biplane roadmap guidance, a Socrates 38 aspiration catheter was navigated over Colossus 35 guidewire into the left A1/ACA. The aspiration catheter was then advanced over the wire to the level of occlusion in the A4 segment and connected to an aspiration pump. Continuous aspiration was performed for 2 minutes. The guide catheter was connected to a VacLok syringe. The aspiration catheter was subsequently removed under constant aspiration. The guide catheter was aspirated for debris. Left internal carotid artery angiograms with frontal and lateral views of the head showed recanalization of the left pericallosal artery with small residual, nonocclusive filling defect well maintained brisk anterograde flow. Persistent occlusion of the distal left M3/MCA segment was seen. Using biplane roadmap guidance, a Socrates 38 aspiration catheter was navigated over Colossus 35 guidewire into the left M2/MCA. The aspiration catheter was then advanced over the wire to the level of occlusion in the M3 segment and connected to an aspiration pump. Continuous aspiration was performed for 2 minutes. The guide catheter was connected to a VacLok syringe. The aspiration catheter was subsequently removed under constant aspiration. The guide catheter was aspirated for debris. Left internal carotid artery angiograms with frontal and lateral views of the head showed recanalization of the  M3 segment with slow distal flow. However, active contrast extravasation was noted at the M3 level. Using biplane roadmap guidance, a phenom 21 microcatheter was navigated over an Aristotle 14 micro guidewire into the left M2/MCA posterior division branch and then into the M3 segment, at the level of bursae contrast extravasation. Then, a detachable coils were placed at the level of contrast extravasation (2 mm x 6 cm target XL 360 soft x2). Control angiograms were obtained. Then, the microcatheter was prepped with dextrose 5%. Subsequently infusion of n-BCA was performed within the recently deployed coil mass. The microcatheter was then removed under constant aspiration. Left internal carotid artery angiograms with frontal and lateral views of the head showed distal occlusion of the left M3/MCA posterior division branch with complete resolution of contrast extravasation. Flat panel CT of the head was obtained and post processed in a separate workstation with concurrent attending physician supervision. Selected images were sent to PACS. Moderate hyperdensity in the left sylvian fissure was noted, consistent with contrast extravasation. Delayed left internal carotid artery angiograms with frontal and lateral views of the head showed stable findings with persistent patency of the left pericallosal artery, occlusion of the distal left M3/MCA at the level of the coil mass with no evidence of contrast extravasation. The catheter was subsequently withdrawn. Right common femoral artery angiogram was obtained in right anterior oblique view. The puncture is at the level of the common femoral artery. The artery has normal caliber, adequate for closure device. The sheath was exchanged over the wire for a Perclose prostyle which was utilized for access closure. Immediate hemostasis was achieved. IMPRESSION:  1. Mechanical thrombectomy performed for treatment of a left pericallosal and left M3/MCA occlusion. 2. Recanalization of  the left pericallosal artery achieved (TICI 3). 3. Recanalization of the left M3/MCA with slow flow, complicated by contrast extravasation requiring occlusion of the distal M3 branch with coils and n-BCA. Final left MCA patency status is a TICI 2B. PLAN: TRansfer to ICU. Electronically Signed   By: Baldemar Lenis M.D.   On: 02/11/2023 11:03   CT HEAD WO CONTRAST ( )  Result Date: 02/11/2023 INDICATION: Kristen Parks is an 82 year old female presenting with left-gaze preference, aphasia, dysarthria, right-sided weakness; NIHSS 19. Her last known well was 10 p.m. on 02/06/2023. Her past medical history significant for hypertension, dyslipidemia, anemia, hypothyroidism, Left corpus collosum infarction in 2019, chronic small vessel disease; baseline modified Rankin scale 3. Head CT showed no evidence of hemorrhage. On my personal review, hypodensity in the posterior left ACA and posterior left MCA territories was seen (MCA - ASPECTS 6). No IV thrombolytic was administered as patient was outside the window. CT angiogram of the head and neck showed an occlusion of the distal left pericallosal artery (A4 segment) in the narrowing of the left M2/MCA posterior division branch, concerning for possible nonocclusive embolus versus stenosis. Findings discussed with patient's daughter including risks and potential benefits of intervention. Decision made to proceed with mechanical thrombectomy. EXAM: ULTRASOUND-GUIDED VASCULAR ACCESS DIAGNOSTIC CEREBRAL ANGIOGRAM MECHANICAL THROMBECTOMY ENDOVASCULAR VESSEL EMBOLIZATION FLAT PANEL HEAD CT COMPARISON:  CT/CT angiogram of the head and neck February 07, 2023. MEDICATIONS: Ancef 2 g IV. ANESTHESIA/SEDATION: The procedure was performed under general anesthesia. CONTRAST:  80 mL of Omnipaque 300 milligram/mL FLUOROSCOPY: Radiation Exposure Index (as provided by the fluoroscopic device): 982 mGy Kerma COMPLICATIONS: SIR LEVEL C - Requires therapy, minor  hospitalization (<48 hrs). TECHNIQUE: Informed written consent was obtained from the patient's daughter after a thorough discussion of the procedural risks, benefits and alternatives. All questions were addressed. Maximal Sterile Barrier Technique was utilized including caps, mask, sterile gowns, sterile gloves, sterile drape, hand hygiene and skin antiseptic. A timeout was performed prior to the initiation of the procedure. The right groin was prepped and draped in the usual sterile fashion. Using a micropuncture kit and the modified Seldinger technique, access was gained to the right common femoral artery and an 8 French sheath was placed. Real-time ultrasound guidance was utilized for vascular access including the acquisition of a permanent ultrasound image documenting patency of the accessed vessel. Under fluoroscopy, a Zoom 88 guide catheter was navigated over a 6 [place] VTK catheter and a 0.035" Terumo Glidewire into the aortic arch. The catheter was placed into the left common carotid artery. The VTK catheter was removed. Using biplane roadmap guidance, the a was advanced over the wire into the mid cervical segment of the left ICA. Frontal and lateral angiograms of the head were obtained. FINDINGS: 1. Mild atherosclerotic changes of the right common femoral artery without hemodynamically significant stenosis. 2. Increased tortuosity of the cervical segment of the left ICA. No significant atherosclerotic disease or stenosis at the left carotid bifurcation. Occlusion of the distal right pericallosal artery at the A4 segment. 3. Occlusion of a left M3/MCA posterior region branch. 4. Increased and early capillary blush in the left posterior MCA territory in the parietal region with early venous drainage, suggestive of ongoing ischemia. 5. No left M2/MCA occlusion or significant stenosis. PROCEDURE: Using biplane roadmap guidance, a Socrates 38 aspiration catheter was navigated over Colossus 35 guidewire into the  left  A1/ACA. The aspiration catheter was then advanced over the wire to the level of occlusion in the A4 segment and connected to an aspiration pump. Continuous aspiration was performed for 2 minutes. The guide catheter was connected to a VacLok syringe. The aspiration catheter was subsequently removed under constant aspiration. The guide catheter was aspirated for debris. Left internal carotid artery angiograms with frontal and lateral views of the head showed recanalization of the left pericallosal artery with small residual, nonocclusive filling defect well maintained brisk anterograde flow. Persistent occlusion of the distal left M3/MCA segment was seen. Using biplane roadmap guidance, a Socrates 38 aspiration catheter was navigated over Colossus 35 guidewire into the left M2/MCA. The aspiration catheter was then advanced over the wire to the level of occlusion in the M3 segment and connected to an aspiration pump. Continuous aspiration was performed for 2 minutes. The guide catheter was connected to a VacLok syringe. The aspiration catheter was subsequently removed under constant aspiration. The guide catheter was aspirated for debris. Left internal carotid artery angiograms with frontal and lateral views of the head showed recanalization of the M3 segment with slow distal flow. However, active contrast extravasation was noted at the M3 level. Using biplane roadmap guidance, a phenom 21 microcatheter was navigated over an Aristotle 14 micro guidewire into the left M2/MCA posterior division branch and then into the M3 segment, at the level of bursae contrast extravasation. Then, a detachable coils were placed at the level of contrast extravasation (2 mm x 6 cm target XL 360 soft x2). Control angiograms were obtained. Then, the microcatheter was prepped with dextrose 5%. Subsequently infusion of n-BCA was performed within the recently deployed coil mass. The microcatheter was then removed under constant  aspiration. Left internal carotid artery angiograms with frontal and lateral views of the head showed distal occlusion of the left M3/MCA posterior division branch with complete resolution of contrast extravasation. Flat panel CT of the head was obtained and post processed in a separate workstation with concurrent attending physician supervision. Selected images were sent to PACS. Moderate hyperdensity in the left sylvian fissure was noted, consistent with contrast extravasation. Delayed left internal carotid artery angiograms with frontal and lateral views of the head showed stable findings with persistent patency of the left pericallosal artery, occlusion of the distal left M3/MCA at the level of the coil mass with no evidence of contrast extravasation. The catheter was subsequently withdrawn. Right common femoral artery angiogram was obtained in right anterior oblique view. The puncture is at the level of the common femoral artery. The artery has normal caliber, adequate for closure device. The sheath was exchanged over the wire for a Perclose prostyle which was utilized for access closure. Immediate hemostasis was achieved. IMPRESSION: 1. Mechanical thrombectomy performed for treatment of a left pericallosal and left M3/MCA occlusion. 2. Recanalization of the left pericallosal artery achieved (TICI 3). 3. Recanalization of the left M3/MCA with slow flow, complicated by contrast extravasation requiring occlusion of the distal M3 branch with coils and n-BCA. Final left MCA patency status is a TICI 2B. PLAN: TRansfer to ICU. Electronically Signed   By: Baldemar Lenis M.D.   On: 02/11/2023 11:03   VAS [place] LOWER EXTREMITY VENOUS (DVT)  Result Date: 02/10/2023  Lower Venous DVT Study Patient Name:  Kristen Parks  Date of Exam:   02/09/2023 Medical Rec #: [id number]       Accession #:    [id number] Date of Birth: Oct 10, 1940  Patient Gender: F Patient Age:   89 years Exam Location:  [facility] Procedure:      VAS [place] LOWER EXTREMITY VENOUS (DVT) Referring Phys: Scheryl Marten Analiah Drum --------------------------------------------------------------------------------  Indications: Stroke.  Limitations: Bandages, altered mental status, poor hemodynamically positioning. Comparison Study: No prior study on file Performing Technologist: Sherren Kerns RVS  Examination Guidelines: A complete evaluation includes B-mode imaging, spectral Doppler, color Doppler, and power Doppler as needed of all accessible portions of each vessel. Bilateral testing is considered an integral part of a complete examination. Limited examinations for reoccurring indications may be performed as noted. The reflux portion of the exam is performed with the patient in reverse Trendelenburg.  +---------+---------------+---------+-----------+----------+-------------------+ RIGHT    CompressibilityPhasicitySpontaneityPropertiesThrombus Aging      +---------+---------------+---------+-----------+----------+-------------------+ CFV      Full           Yes      Yes                                      +---------+---------------+---------+-----------+----------+-------------------+ SFJ      Full                                                             +---------+---------------+---------+-----------+----------+-------------------+ FV Prox  Full                                                             +---------+---------------+---------+-----------+----------+-------------------+ FV Mid   Full                                                             +---------+---------------+---------+-----------+----------+-------------------+ FV DistalFull                                                             +---------+---------------+---------+-----------+----------+-------------------+ PFV      Full                                                              +---------+---------------+---------+-----------+----------+-------------------+ POP      Full           Yes      No                                       +---------+---------------+---------+-----------+----------+-------------------+ PTV      Full                                                             +---------+---------------+---------+-----------+----------+-------------------+  PERO                                                  Not well visualized +---------+---------------+---------+-----------+----------+-------------------+   +---------+---------------+---------+-----------+----------+-------------------+ LEFT     CompressibilityPhasicitySpontaneityPropertiesThrombus Aging      +---------+---------------+---------+-----------+----------+-------------------+ CFV      Full           Yes      Yes                                      +---------+---------------+---------+-----------+----------+-------------------+ SFJ      Full                                                             +---------+---------------+---------+-----------+----------+-------------------+ FV Prox  Full                                                             +---------+---------------+---------+-----------+----------+-------------------+ FV Mid   Full                                                             +---------+---------------+---------+-----------+----------+-------------------+ FV DistalFull                                                             +---------+---------------+---------+-----------+----------+-------------------+ PFV      Full                                                             +---------+---------------+---------+-----------+----------+-------------------+ POP                     No       Yes                  patent by color and                                                       Doppler              +---------+---------------+---------+-----------+----------+-------------------+ PTV      Full                                                             +---------+---------------+---------+-----------+----------+-------------------+  PERO                                                  Not well visualized +---------+---------------+---------+-----------+----------+-------------------+     *See table(s) above for measurements and observations. Electronically signed by Sherald Hess MD on 02/10/2023 at 11:13:31 AM.    Final    ECHOCARDIOGRAM COMPLETE  Result Date: 02/08/2023    ECHOCARDIOGRAM REPORT   Patient Name:   Kristen Parks Date of Exam: 02/08/2023 Medical Rec #:  [id number]      Height:       62.0 in Accession #:    [id number]     Weight:       109.8 lb Date of Birth:  02/26/1941       BSA:          1.482 m Patient Age:    82 years       BP:           132/77 mmHg Patient Gender: F              HR:           76 bpm. Exam Location:  Inpatient Procedure: 2D Echo, Cardiac Doppler and Color Doppler Indications:    Stroke 434.91/I163.9  History:        Patient has prior history of Echocardiogram examinations, most                 recent 04/15/2018. Stroke; Risk Factors:Hypertension and                 Dyslipidemia. Migraine.  Sonographer:    Lucendia Herrlich RCS Referring Phys: Amada Jupiter, MCNEILL, P  Sonographer Comments: Image acquisition challenging due to patient body habitus. IMPRESSIONS  1. Left ventricular ejection fraction, by estimation, is 55 to 60%. The left ventricle has normal function. The left ventricle has no regional wall motion abnormalities. Left ventricular diastolic parameters are consistent with Grade I diastolic dysfunction (impaired relaxation).  2. Right ventricular systolic function is normal. The right ventricular size is normal. Tricuspid regurgitation signal is inadequate for assessing PA pressure. The estimated right ventricular systolic pressure is 18.5 mmHg.   3. The mitral valve is grossly normal. No evidence of mitral valve regurgitation. No evidence of mitral stenosis.  4. The aortic valve is tricuspid. Aortic valve regurgitation is not visualized. No aortic stenosis is present.  5. The inferior vena cava is normal in size with greater than 50% respiratory variability, suggesting right atrial pressure of 3 mmHg. Comparison(s): No significant change from prior study. FINDINGS  Left Ventricle: Left ventricular ejection fraction, by estimation, is 55 to 60%. The left ventricle has normal function. The left ventricle has no regional wall motion abnormalities. The left ventricular internal cavity size was normal in size. There is  no left ventricular hypertrophy. Left ventricular diastolic function could not be evaluated due to nondiagnostic images. Left ventricular diastolic parameters are consistent with Grade I diastolic dysfunction (impaired relaxation). Right Ventricle: The right ventricular size is normal. No increase in right ventricular wall thickness. Right ventricular systolic function is normal. Tricuspid regurgitation signal is inadequate for assessing PA pressure. The tricuspid regurgitant velocity is 1.97 m/s, and with an assumed right atrial pressure of 3 mmHg, the estimated right ventricular systolic pressure is 18.5 mmHg. Left Atrium: Left atrial size was normal in size. Right  Atrium: Right atrial size was normal in size. Pericardium: There is no evidence of pericardial effusion. Mitral Valve: The mitral valve is grossly normal. No evidence of mitral valve regurgitation. No evidence of mitral valve stenosis. Tricuspid Valve: The tricuspid valve is grossly normal. Tricuspid valve regurgitation is trivial. No evidence of tricuspid stenosis. Aortic Valve: The aortic valve is tricuspid. Aortic valve regurgitation is not visualized. No aortic stenosis is present. Aortic valve peak gradient measures 6.1 mmHg. Pulmonic Valve: The pulmonic valve was grossly normal.  Pulmonic valve regurgitation is not visualized. No evidence of pulmonic stenosis. Aorta: The aortic root and ascending aorta are structurally normal, with no evidence of dilitation. Venous: The inferior vena cava is normal in size with greater than 50% respiratory variability, suggesting right atrial pressure of 3 mmHg. IAS/Shunts: The atrial septum is grossly normal.  LEFT VENTRICLE PLAX 2D LVIDd:         3.30 cm   Diastology LVIDs:         2.10 cm   LV e' medial:    4.62 cm/s LV PW:         0.90 cm   LV E/e' medial:  10.6 LV IVS:        0.90 cm   LV e' lateral:   9.01 cm/s LVOT diam:     2.00 cm   LV E/e' lateral: 5.4 LV SV:         41 LV SV Index:   28 LVOT Area:     3.14 cm  RIGHT VENTRICLE             IVC RV S prime:     14.90 cm/s  IVC diam: 1.80 cm TAPSE (M-mode): 1.8 cm LEFT ATRIUM           Index LA diam:      2.40 cm 1.62 cm/m LA Vol (A2C): 9.8 ml  6.59 ml/m  AORTIC VALVE AV Area (Vmax): 1.77 cm AV Vmax:        123.00 cm/s AV Peak Grad:   6.1 mmHg LVOT Vmax:      69.22 cm/s LVOT Vmean:     43.400 cm/s LVOT VTI:       0.130 m  AORTA Ao Root diam: 2.80 cm Ao Asc diam:  3.10 cm MITRAL VALVE               TRICUSPID VALVE MV Area (PHT): 4.60 cm    TR Peak grad:   15.5 mmHg MV Decel Time: 165 msec    TR Vmax:        197.00 cm/s MV E velocity: 49.00 cm/s MV A velocity: 67.40 cm/s  SHUNTS MV E/A ratio:  0.73        Systemic VTI:  0.13 m                            Systemic Diam: 2.00 cm Lennie Odor MD Electronically signed by Lennie Odor MD Signature Date/Time: 02/08/2023/9:48:49 AM    Final    CT HEAD WO CONTRAST ( )  Result Date: 02/08/2023 CLINICAL DATA:  82 year old female code stroke presentation yesterday. Left MCA abnormality, left ACA and MCA branch occlusions and endovascular reperfusion. Posterior left MCA branch extravasation and subsequent blue and coil embolization. EXAM: CT HEAD WITHOUT CONTRAST TECHNIQUE: Contiguous axial images were obtained from the base of the skull through the  vertex without intravenous contrast. RADIATION DOSE REDUCTION: This exam was performed according to the  departmental dose-optimization program which includes automated exposure control, adjustment of the mA and/or kV according to patient size and/or use of iterative reconstruction technique. COMPARISON:  Head CT 1039 hours yesterday. FINDINGS: Brain: Small volume subarachnoid hemorrhage in the left sylvian fissure primarily. No intraventricular blood. No ventriculomegaly. No midline shift. Left MCA cytotoxic edema most apparent in the posterior division, lateral perirolandic area series 3, image 18. Additional confluent cytotoxic edema at the posterior ACA/MCA watershed, left vertex and parietal lobe series 3, image 23. Elsewhere gray-white differentiation appears stable including advanced chronic white matter hypodensity, chronic left cerebellar infarcts. No other acute intracranial hemorrhage identified. Basilar cisterns remain patent. Vascular: Posterior left MCA region embolization sequelae. Skull: Stable, intact. Sinuses/Orbits: Visualized paranasal sinuses and mastoids are stable and well aerated. Other: Left nasoenteric tube now in place. No acute orbit or scalp soft tissue finding. Left gaze persists. IMPRESSION: 1. Cytotoxic edema in the posterior left MCA and left distal ACA/MCA watershed area. Sequelae of posterior left MCA branch embolization. 2. Small volume acute subarachnoid hemorrhage, primarily in the left sylvian fissure. Heidelberg classification 3c: Subarachnoid hemorrhage. 3. No midline shift or significant intracranial mass effect. No IVH or ventriculomegaly. 4. Underlying chronic ischemia. Electronically Signed   By: Odessa Fleming M.D.   On: 02/08/2023 05:22   MR BRAIN WO CONTRAST  Result Date: 02/07/2023 CLINICAL DATA:  MCA branch occlusion status post thrombectomy EXAM: MRI HEAD WITHOUT CONTRAST TECHNIQUE: Multiplanar, multiecho pulse sequences of the brain and surrounding structures were  obtained without intravenous contrast. COMPARISON:  Same-day CT/CTA head and neck FINDINGS: Brain: There is moderate volume patchy diffusion restriction in the left cerebral hemisphere involving the caudate head, corona radiata, insula, parietal lobe, and occipital lobe primarily in the MCA and MCA watershed distributions. There is petechial hemorrhage in the parasagittal parietal lobe (12-45). There is FLAIR signal abnormality and SWI signal dropout in the basal cisterns extending to the left MCA cistern, sylvian fissure, and over the parietal lobe likely reflecting a combination of subarachnoid hemorrhage and contrast extravasation. There is no significant mass effect or midline shift. Background parenchymal volume is within expected limits for age. There is advanced background chronic small-vessel ischemic change with remote infarcts in the bilateral thalami and left cerebellar hemisphere. The pituitary and suprasellar region are grossly unremarkable. Vascular: The major flow voids are present. Skull and upper cervical spine: There is no definite marrow signal abnormality. Sinuses/Orbits: The paranasal sinuses are clear. Bilateral lens implants are in place. The globes and orbits are otherwise unremarkable. Other: The mastoid air cells and middle ear cavities are clear. IMPRESSION: 1. Moderate volume patchy acute infarcts in the left cerebral hemisphere primarily in the MCA and MCA watershed distributions with petechial hemorrhage in the parietal lobe but no significant mass effect or midline shift. 2. Probable combination of extravasated contrast and subarachnoid hemorrhage in the basal cisterns and over the left cerebral hemisphere. Recommend attention on subsequent head CTs. Electronically Signed   By: Lesia Hausen M.D.   On: 02/07/2023 18:30   DG Abd Portable 1V  Result Date: 02/07/2023 CLINICAL DATA:  Nasoenteric feeding tube placement EXAM: PORTABLE ABDOMEN - 1 VIEW COMPARISON:  None Available.  FINDINGS: Nasoenteric feeding tube tip overlies the expected distal body of the stomach. The abdominal gas pattern is nonspecific due to a paucity of intra-abdominal gas. Cholecystectomy clips are seen in the right upper quadrant. Contrast is seen within nondilated renal collecting systems bilaterally. IMPRESSION: 1. Nasoenteric feeding tube tip within the distal body of the  stomach. Electronically Signed   By: Helyn Numbers M.D.   On: 02/07/2023 17:59   CT ANGIO HEAD NECK W WO CM W PERF (CODE STROKE)  Result Date: 02/07/2023 CLINICAL DATA:  Neuro deficit, acute, stroke suspected. Left-sided visual preference and slurred speech. EXAM: CT ANGIOGRAPHY HEAD AND NECK CT PERFUSION BRAIN TECHNIQUE: Multidetector CT imaging of the head and neck was performed using the standard protocol during bolus administration of intravenous contrast. Multiplanar CT image reconstructions and MIPs were obtained to evaluate the vascular anatomy. Carotid stenosis measurements (when applicable) are obtained utilizing NASCET criteria, using the distal internal carotid diameter as the denominator. Multiphase CT imaging of the brain was performed following IV bolus contrast injection. Subsequent parametric perfusion maps were calculated using RAPID software. RADIATION DOSE REDUCTION: This exam was performed according to the departmental dose-optimization program which includes automated exposure control, adjustment of the mA and/or kV according to patient size and/or use of iterative reconstruction technique. CONTRAST:  OMNIPAQUE IOHEXOL 350 MG/ML SOLN COMPARISON:  Head CT 02/07/2023. CTA neck 04/15/2018. MRI/MRA head 04/14/2018. FINDINGS: CTA NECK FINDINGS Aortic arch: Three-vessel arch configuration. Arch vessel origins are patent. Right carotid system: No evidence of dissection, stenosis (50% or greater) or occlusion. Left carotid system: No evidence of dissection, stenosis (50% or greater) or occlusion. Vertebral arteries: Left  dominant. No evidence of dissection, stenosis (50% or greater) or occlusion. Skeleton: Mild cervical spondylosis without high-grade spinal canal stenosis. 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 Upper chest: 11 mm solid nodule in the superior segment of the left lower lobe (axial image 186 series 5). Review of the MIP images confirms the above findings CTA HEAD FINDINGS Anterior circulation: Intracranial ICAs are patent without stenosis or aneurysm. Proximal high-grade stenosis versus subocclusive embolus of the left MCA superior M2 division with distal reconstitution (axial images 98-103 series 8), new from the prior MRA. Distal branches are symmetric. Posterior circulation: Normal basilar artery. The SCAs, AICAs and PICAs are patent proximally. The PCAs are patent proximally without stenosis or aneurysm. Distal branches are symmetric. Venous sinuses: Early phase of contrast. Anatomic variants: Hypoplastic right A1 segment. Azygous A2 segment. Persistent fetal origin of the right PCA with hypoplastic right P1 segment. Review of the MIP images confirms the above findings CT Brain Perfusion Findings: CBF (<30%) Volume: 12mL estimated by automated post processing. However, given significant motion during the first pass arterial bolus, this may be an overestimate. Perfusion (Tmax>6.0s) volume: 72mL estimated by automated post processing. However, given significant motion during the first pass arterial bolus, this may be an overestimate. Mismatch Volume: 60mL Infarction Location:Left frontoparietal junction, MCA territory. IMPRESSION: 1. Proximal high-grade stenosis versus subocclusive embolus of the left MCA superior M2 division with distal reconstitution, new from the prior MRA. 2. CT perfusion demonstrates a 12 mL core infarct in the left frontoparietal junction, MCA territory. However,  given significant motion during the first pass arterial bolus, this may be an overestimate. 3. No hemodynamically significant stenosis in the neck. 4. 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 5. A 11 mm solid nodule in the superior segment of the left lower lobe. Consider one of the following in 3 months for both low-risk and high-risk individuals: (a) repeat chest CT, (b) follow-up PET-CT, or (c) tissue sampling. This recommendation follows the consensus statement: Guidelines for Management  of Incidental Pulmonary Nodules Detected on CT Images: From the Fleischner Society 2017; Radiology 2017; [phone]. Code stroke imaging results were communicated on 02/07/2023 at 11:17 am to provider Dr. Amada Jupiter via telephone, who verbally acknowledged these results. Electronically Signed   By: Orvan Falconer M.D.   On: 02/07/2023 11:32   CT HEAD CODE STROKE WO CONTRAST  Result Date: 02/07/2023 CLINICAL DATA:  Code stroke. Neuro deficit, acute, stroke suspected. Left visual preference and slurred speech. EXAM: CT HEAD WITHOUT CONTRAST TECHNIQUE: Contiguous axial images were obtained from the base of the skull through the vertex without intravenous contrast. RADIATION DOSE REDUCTION: This exam was performed according to the departmental dose-optimization program which includes automated exposure control, adjustment of the mA and/or kV according to patient size and/or use of iterative reconstruction technique. COMPARISON:  Head CT 04/28/2021. FINDINGS: Brain: No acute hemorrhage. Possible new loss of gray-white differentiation along the posterior aspect of the left subfrontal gyrus (sagittal image 41 series 6). Background of severe chronic small-vessel disease with old infarct along the left middle frontal gyrus and old perforator infarcts in the bilateral thalami and left cerebellar hemisphere. No hydrocephalus or extra-axial  collection. No mass effect or midline shift. Vascular: No hyperdense vessel or unexpected calcification. Skull: No calvarial fracture or suspicious bone lesion. Skull base is unremarkable. Sinuses/Orbits: No acute finding. Other: None. ASPECTS [facility] Stroke Program Early CT Score) - Ganglionic level infarction (caudate, lentiform nuclei, internal capsule, insula, M1-M3 cortex): 7 - Supraganglionic infarction (M4-M6 cortex): 2 Total score (0-10 with 10 being normal): 9 IMPRESSION: 1. Possible new loss of gray-white differentiation along the posterior aspect of the left subfrontal gyrus, which could represent an acute infarct. No acute hemorrhage. 2. Background of severe chronic small-vessel disease with old infarct along the left middle frontal gyrus and old perforator infarcts in the bilateral thalami and left cerebellar hemisphere. Code stroke imaging results were communicated on 02/07/2023 at 10:47 am to provider Dr. Amada Jupiter via secure text paging. Electronically Signed   By: Orvan Falconer M.D.   On: 02/07/2023 10:47       HISTORY OF PRESENT ILLNESS 82 y.o. patient with history of hypertension, hyperlipidemia, stroke in 2019  was admitted with slurred speech, aphasia, left gaze and right-sided weakness. No tPA given due to outside window   HOSPITAL COURSE Stroke:  left MCA and ACA infarcts s/p IR with TICI3 on L A4 and TICI2c on L M3 complicated by extravasation at left M3 s/p coil embolization, embolic pattern, likely secondary to cardioembolic source CT head no acute infarct, questionable left frontal early sign of stroke CT head and neck left M2 high-grade stenosis versus near occlusion CTP 12/72 Status post IR with left A4 occlusion with TICI3 reperfusion, left M3 occlusion with TICI2c complicated by extravasation status post coil embolization MRI left MCA and ACA infarcts with hemorrhagic transformation and questionable SAH CT 12/26 left MCA and left distal ACA/MCA watershed area infarcts.   Small volume acute SAH, primary in the left sylvian fissure CT repeat 10/29 stable 2D Echo EF 55 to 60% LE venous Doppler no DVT Will recommend 30 day cardiac monitor as outpatient to rule out A-fib.  If negative, may consider loop recorder after if significant neurological improvement. LDL 101 HgbA1c 5.2 aspirin 81 mg daily prior to admission, now on ASA 81mg  Ongoing aggressive stroke risk factor management Therapy recommendations:  SNF Disposition: SNF   History of stroke 03/2018 admitted for small left CC and cingulate gyrus infarct left ACA territory.  MRA head  negative.  CTA neck on markable.  LDL 39, A1c 5.1.  EF 60 to 60%.  Had anemia with PRBC transfusion.  Discharged on aspirin and Zocor 20. Outpatient 30-day CardioNet monitor negative for A-fib.   Hypertension Stable now Long term BP goal normotensive   Hyperlipidemia Home meds: None LDL 101, goal < 70 Now on Crestor 20 Continue statin at discharge   Dysphagia Now on Dys 2 diet with thin liquid P.o. intake improved, core Trak has discontinued Encourage p.o. intake dietitian consulted, appreciate assistance   Other Stroke Risk Factors Advanced age   Other Active Problems Hypophosphatemia Phos 2.1 replaced  DISCHARGE EXAM  PHYSICAL EXAM General:  Alert, well-nourished, well-developed patient in no acute distress Psych:  Mood and affect appropriate for situation CV: Regular rate and rhythm on monitor Respiratory:  Regular, unlabored respirations on room air GI: Abdomen soft and nontender  NEURO:  Mental Status: awake, alert sitting up in the bed. Pt eyes open, global aphasia, not following commands, able to have sounds out but not meaningful. Left gaze preference, does cross midline, tracks,  right hemianopia vs. Visual neglect, no blink to threat on the right, PERRL. Right facial droop. Tongue protrusion not cooperative. LUE at least 3+/5 and LLE at least 3/5, RUE spontaneous, purposeful movement against  gravity, RLE withdraws   Discharge Diet       Diet   DIET DYS 3 Room service appropriate? No; Fluid consistency: Thin   liquids  DISCHARGE PLAN Disposition: Skilled nursing facility aspirin 81 mg daily for secondary stroke prevention  Ongoing stroke risk factor control by Primary Care Physician at time of discharge Follow-up PCP Garlan Fillers, MD in 2 weeks. Follow-up in [name] in 4-8 weeks, office to schedule an appointment. Able to see NP in clinic.  45 minutes were spent preparing discharge.   Gevena Mart DNP, ACNPC-AG  [facility]  ATTENDING NOTE: I reviewed above note and agree with the assessment and plan. Pt was seen and examined.   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  Core Trak and feeding discontinued, eating well so far.  On aspirin and statin.  Recommend outpatient 30-day cardiac event monitoring to rule out A-fib.  Medically ready for SNF placement.  Follow-up at [facility] in 4 to 8 weeks  For detailed assessment and plan, please refer to above/below as I have made changes wherever appropriate.   Marvel Plan, MD PhD Stroke Neurology 02/14/2023 5:52 PM

## 2023-02-17 DIAGNOSIS — E039 Hypothyroidism, unspecified: Secondary | ICD-10-CM | POA: Diagnosis not present

## 2023-02-17 DIAGNOSIS — D649 Anemia, unspecified: Secondary | ICD-10-CM | POA: Diagnosis not present

## 2023-02-17 DIAGNOSIS — E785 Hyperlipidemia, unspecified: Secondary | ICD-10-CM | POA: Diagnosis not present

## 2023-02-17 DIAGNOSIS — E441 Mild protein-calorie malnutrition: Secondary | ICD-10-CM | POA: Diagnosis not present

## 2023-02-18 DIAGNOSIS — E43 Unspecified severe protein-calorie malnutrition: Secondary | ICD-10-CM | POA: Diagnosis not present

## 2023-02-18 DIAGNOSIS — R519 Headache, unspecified: Secondary | ICD-10-CM | POA: Diagnosis not present

## 2023-02-18 DIAGNOSIS — F0390 Unspecified dementia without behavioral disturbance: Secondary | ICD-10-CM | POA: Diagnosis not present

## 2023-02-18 DIAGNOSIS — R42 Dizziness and giddiness: Secondary | ICD-10-CM | POA: Diagnosis not present

## 2023-02-18 DIAGNOSIS — I10 Essential (primary) hypertension: Secondary | ICD-10-CM | POA: Diagnosis not present

## 2023-02-18 DIAGNOSIS — G8191 Hemiplegia, unspecified affecting right dominant side: Secondary | ICD-10-CM | POA: Diagnosis not present

## 2023-02-20 DIAGNOSIS — G8191 Hemiplegia, unspecified affecting right dominant side: Secondary | ICD-10-CM | POA: Diagnosis not present

## 2023-02-20 DIAGNOSIS — F0390 Unspecified dementia without behavioral disturbance: Secondary | ICD-10-CM | POA: Diagnosis not present

## 2023-02-20 DIAGNOSIS — I10 Essential (primary) hypertension: Secondary | ICD-10-CM | POA: Diagnosis not present

## 2023-02-20 DIAGNOSIS — E43 Unspecified severe protein-calorie malnutrition: Secondary | ICD-10-CM | POA: Diagnosis not present

## 2023-03-05 DIAGNOSIS — G8191 Hemiplegia, unspecified affecting right dominant side: Secondary | ICD-10-CM | POA: Diagnosis not present

## 2023-03-05 DIAGNOSIS — F0390 Unspecified dementia without behavioral disturbance: Secondary | ICD-10-CM | POA: Diagnosis not present

## 2023-03-05 DIAGNOSIS — R519 Headache, unspecified: Secondary | ICD-10-CM | POA: Diagnosis not present

## 2023-03-05 DIAGNOSIS — R42 Dizziness and giddiness: Secondary | ICD-10-CM | POA: Diagnosis not present

## 2023-03-05 DIAGNOSIS — E43 Unspecified severe protein-calorie malnutrition: Secondary | ICD-10-CM | POA: Diagnosis not present

## 2023-03-05 DIAGNOSIS — Z9181 History of falling: Secondary | ICD-10-CM | POA: Diagnosis not present

## 2023-03-05 DIAGNOSIS — I10 Essential (primary) hypertension: Secondary | ICD-10-CM | POA: Diagnosis not present

## 2023-04-04 DIAGNOSIS — F0390 Unspecified dementia without behavioral disturbance: Secondary | ICD-10-CM | POA: Diagnosis not present

## 2023-04-04 DIAGNOSIS — E785 Hyperlipidemia, unspecified: Secondary | ICD-10-CM | POA: Diagnosis not present

## 2023-04-04 DIAGNOSIS — E43 Unspecified severe protein-calorie malnutrition: Secondary | ICD-10-CM | POA: Diagnosis not present

## 2023-04-04 DIAGNOSIS — I10 Essential (primary) hypertension: Secondary | ICD-10-CM | POA: Diagnosis not present

## 2023-04-04 DIAGNOSIS — G8191 Hemiplegia, unspecified affecting right dominant side: Secondary | ICD-10-CM | POA: Diagnosis not present

## 2023-04-04 DIAGNOSIS — I639 Cerebral infarction, unspecified: Secondary | ICD-10-CM | POA: Diagnosis not present

## 2023-04-11 DIAGNOSIS — I10 Essential (primary) hypertension: Secondary | ICD-10-CM | POA: Diagnosis not present

## 2023-04-11 DIAGNOSIS — E785 Hyperlipidemia, unspecified: Secondary | ICD-10-CM | POA: Diagnosis not present

## 2023-04-11 DIAGNOSIS — E039 Hypothyroidism, unspecified: Secondary | ICD-10-CM | POA: Diagnosis not present

## 2023-04-17 DIAGNOSIS — Z1331 Encounter for screening for depression: Secondary | ICD-10-CM | POA: Diagnosis not present

## 2023-04-17 DIAGNOSIS — Z1339 Encounter for screening examination for other mental health and behavioral disorders: Secondary | ICD-10-CM | POA: Diagnosis not present

## 2023-04-17 DIAGNOSIS — J00 Acute nasopharyngitis [common cold]: Secondary | ICD-10-CM | POA: Diagnosis not present

## 2023-04-17 DIAGNOSIS — R4701 Aphasia: Secondary | ICD-10-CM | POA: Diagnosis not present

## 2023-04-17 DIAGNOSIS — G629 Polyneuropathy, unspecified: Secondary | ICD-10-CM | POA: Diagnosis not present

## 2023-04-17 DIAGNOSIS — G8191 Hemiplegia, unspecified affecting right dominant side: Secondary | ICD-10-CM | POA: Diagnosis not present

## 2023-04-17 DIAGNOSIS — E440 Moderate protein-calorie malnutrition: Secondary | ICD-10-CM | POA: Diagnosis not present

## 2023-04-17 DIAGNOSIS — Z0000 Encounter for general adult medical examination without abnormal findings: Secondary | ICD-10-CM | POA: Diagnosis not present

## 2023-04-17 DIAGNOSIS — R051 Acute cough: Secondary | ICD-10-CM | POA: Diagnosis not present

## 2023-04-17 DIAGNOSIS — E039 Hypothyroidism, unspecified: Secondary | ICD-10-CM | POA: Diagnosis not present

## 2023-04-17 DIAGNOSIS — E785 Hyperlipidemia, unspecified: Secondary | ICD-10-CM | POA: Diagnosis not present

## 2023-05-17 DIAGNOSIS — I10 Essential (primary) hypertension: Secondary | ICD-10-CM | POA: Diagnosis not present

## 2023-05-17 DIAGNOSIS — I639 Cerebral infarction, unspecified: Secondary | ICD-10-CM | POA: Diagnosis not present

## 2023-05-17 DIAGNOSIS — E785 Hyperlipidemia, unspecified: Secondary | ICD-10-CM | POA: Diagnosis not present

## 2023-05-17 DIAGNOSIS — F0390 Unspecified dementia without behavioral disturbance: Secondary | ICD-10-CM | POA: Diagnosis not present

## 2023-05-22 ENCOUNTER — Inpatient Hospital Stay: Payer: Medicare HMO | Admitting: Neurology

## 2023-05-27 ENCOUNTER — Other Ambulatory Visit: Payer: Self-pay

## 2023-05-27 DIAGNOSIS — I69322 Dysarthria following cerebral infarction: Secondary | ICD-10-CM | POA: Diagnosis not present

## 2023-05-27 DIAGNOSIS — F0280 Dementia in other diseases classified elsewhere without behavioral disturbance: Secondary | ICD-10-CM | POA: Diagnosis not present

## 2023-05-27 DIAGNOSIS — I69320 Aphasia following cerebral infarction: Secondary | ICD-10-CM | POA: Diagnosis not present

## 2023-05-27 DIAGNOSIS — Z681 Body mass index (BMI) 19 or less, adult: Secondary | ICD-10-CM | POA: Diagnosis not present

## 2023-05-27 DIAGNOSIS — M1990 Unspecified osteoarthritis, unspecified site: Secondary | ICD-10-CM | POA: Diagnosis not present

## 2023-05-27 DIAGNOSIS — E039 Hypothyroidism, unspecified: Secondary | ICD-10-CM | POA: Diagnosis not present

## 2023-05-27 DIAGNOSIS — I69351 Hemiplegia and hemiparesis following cerebral infarction affecting right dominant side: Secondary | ICD-10-CM | POA: Diagnosis not present

## 2023-05-27 DIAGNOSIS — E43 Unspecified severe protein-calorie malnutrition: Secondary | ICD-10-CM | POA: Diagnosis not present

## 2023-05-27 DIAGNOSIS — E785 Hyperlipidemia, unspecified: Secondary | ICD-10-CM | POA: Diagnosis not present

## 2023-05-27 DIAGNOSIS — I69391 Dysphagia following cerebral infarction: Secondary | ICD-10-CM | POA: Diagnosis not present

## 2023-05-27 DIAGNOSIS — D649 Anemia, unspecified: Secondary | ICD-10-CM | POA: Diagnosis not present

## 2023-05-27 DIAGNOSIS — I10 Essential (primary) hypertension: Secondary | ICD-10-CM | POA: Diagnosis not present

## 2023-05-27 NOTE — Patient Outreach (Signed)
First telephone outreach attempt to obtain mRS. No answer. Left message for returned call.  [facility]  Population Health Care Management Assistant  Direct Dial: [phone]  Fax: [phone] Website: [email or web address]

## 2023-05-29 ENCOUNTER — Other Ambulatory Visit: Payer: Self-pay

## 2023-05-29 NOTE — Patient Outreach (Signed)
Telephone outreach to patient's daughter to obtain mRS was successfully completed. MRS= 5  Vanice Sarah [facility] Health Care Management Assistant  Direct Dial: [phone]  Fax: [phone] Website: [email or web address]

## 2023-07-03 DIAGNOSIS — I639 Cerebral infarction, unspecified: Secondary | ICD-10-CM | POA: Diagnosis not present

## 2023-07-03 DIAGNOSIS — E785 Hyperlipidemia, unspecified: Secondary | ICD-10-CM | POA: Diagnosis not present

## 2023-07-03 DIAGNOSIS — I10 Essential (primary) hypertension: Secondary | ICD-10-CM | POA: Diagnosis not present

## 2023-07-03 DIAGNOSIS — F0390 Unspecified dementia without behavioral disturbance: Secondary | ICD-10-CM | POA: Diagnosis not present

## 2023-08-03 DIAGNOSIS — I639 Cerebral infarction, unspecified: Secondary | ICD-10-CM | POA: Diagnosis not present

## 2023-08-03 DIAGNOSIS — E785 Hyperlipidemia, unspecified: Secondary | ICD-10-CM | POA: Diagnosis not present

## 2023-08-03 DIAGNOSIS — I10 Essential (primary) hypertension: Secondary | ICD-10-CM | POA: Diagnosis not present

## 2023-08-03 DIAGNOSIS — F0390 Unspecified dementia without behavioral disturbance: Secondary | ICD-10-CM | POA: Diagnosis not present

## 2023-09-02 DIAGNOSIS — I639 Cerebral infarction, unspecified: Secondary | ICD-10-CM | POA: Diagnosis not present

## 2023-09-02 DIAGNOSIS — E785 Hyperlipidemia, unspecified: Secondary | ICD-10-CM | POA: Diagnosis not present

## 2023-09-02 DIAGNOSIS — F0390 Unspecified dementia without behavioral disturbance: Secondary | ICD-10-CM | POA: Diagnosis not present

## 2023-09-02 DIAGNOSIS — I10 Essential (primary) hypertension: Secondary | ICD-10-CM | POA: Diagnosis not present

## 2023-10-03 DIAGNOSIS — I639 Cerebral infarction, unspecified: Secondary | ICD-10-CM | POA: Diagnosis not present

## 2023-10-03 DIAGNOSIS — I10 Essential (primary) hypertension: Secondary | ICD-10-CM | POA: Diagnosis not present

## 2023-10-03 DIAGNOSIS — E785 Hyperlipidemia, unspecified: Secondary | ICD-10-CM | POA: Diagnosis not present

## 2023-10-03 DIAGNOSIS — F0390 Unspecified dementia without behavioral disturbance: Secondary | ICD-10-CM | POA: Diagnosis not present

## 2023-10-28 DIAGNOSIS — M25561 Pain in right knee: Secondary | ICD-10-CM | POA: Diagnosis not present

## 2023-10-28 DIAGNOSIS — G8191 Hemiplegia, unspecified affecting right dominant side: Secondary | ICD-10-CM | POA: Diagnosis not present

## 2023-10-28 DIAGNOSIS — E440 Moderate protein-calorie malnutrition: Secondary | ICD-10-CM | POA: Diagnosis not present

## 2023-10-28 DIAGNOSIS — R2681 Unsteadiness on feet: Secondary | ICD-10-CM | POA: Diagnosis not present

## 2023-10-28 DIAGNOSIS — K5909 Other constipation: Secondary | ICD-10-CM | POA: Diagnosis not present

## 2023-11-02 DIAGNOSIS — I639 Cerebral infarction, unspecified: Secondary | ICD-10-CM | POA: Diagnosis not present

## 2023-11-02 DIAGNOSIS — E785 Hyperlipidemia, unspecified: Secondary | ICD-10-CM | POA: Diagnosis not present

## 2023-11-02 DIAGNOSIS — F0390 Unspecified dementia without behavioral disturbance: Secondary | ICD-10-CM | POA: Diagnosis not present

## 2023-11-02 DIAGNOSIS — I10 Essential (primary) hypertension: Secondary | ICD-10-CM | POA: Diagnosis not present

## 2023-12-03 DIAGNOSIS — F0390 Unspecified dementia without behavioral disturbance: Secondary | ICD-10-CM | POA: Diagnosis not present

## 2023-12-03 DIAGNOSIS — I10 Essential (primary) hypertension: Secondary | ICD-10-CM | POA: Diagnosis not present

## 2023-12-03 DIAGNOSIS — E785 Hyperlipidemia, unspecified: Secondary | ICD-10-CM | POA: Diagnosis not present

## 2023-12-03 DIAGNOSIS — I639 Cerebral infarction, unspecified: Secondary | ICD-10-CM | POA: Diagnosis not present

## 2024-01-03 DIAGNOSIS — I639 Cerebral infarction, unspecified: Secondary | ICD-10-CM | POA: Diagnosis not present

## 2024-01-03 DIAGNOSIS — F0390 Unspecified dementia without behavioral disturbance: Secondary | ICD-10-CM | POA: Diagnosis not present

## 2024-01-03 DIAGNOSIS — I10 Essential (primary) hypertension: Secondary | ICD-10-CM | POA: Diagnosis not present

## 2024-01-03 DIAGNOSIS — E785 Hyperlipidemia, unspecified: Secondary | ICD-10-CM | POA: Diagnosis not present

## 2024-02-02 DIAGNOSIS — E785 Hyperlipidemia, unspecified: Secondary | ICD-10-CM | POA: Diagnosis not present

## 2024-02-02 DIAGNOSIS — F0390 Unspecified dementia without behavioral disturbance: Secondary | ICD-10-CM | POA: Diagnosis not present

## 2024-02-02 DIAGNOSIS — I639 Cerebral infarction, unspecified: Secondary | ICD-10-CM | POA: Diagnosis not present

## 2024-02-02 DIAGNOSIS — I10 Essential (primary) hypertension: Secondary | ICD-10-CM | POA: Diagnosis not present
